# Patient Record
Sex: Male | Born: 1950 | Race: White | Hispanic: No | Marital: Married | Smoking: Former smoker
Health system: Southern US, Community
[De-identification: ages and names within clinical notes are randomized; demographics above are authoritative.]

## PROBLEM LIST (undated history)

## (undated) DIAGNOSIS — G20A1 Parkinson's disease without dyskinesia, without mention of fluctuations: Secondary | ICD-10-CM

## (undated) DIAGNOSIS — C801 Malignant (primary) neoplasm, unspecified: Secondary | ICD-10-CM

## (undated) DIAGNOSIS — C9110 Chronic lymphocytic leukemia of B-cell type not having achieved remission: Secondary | ICD-10-CM

## (undated) DIAGNOSIS — M1990 Unspecified osteoarthritis, unspecified site: Secondary | ICD-10-CM

## (undated) DIAGNOSIS — N400 Enlarged prostate without lower urinary tract symptoms: Secondary | ICD-10-CM

## (undated) HISTORY — DX: Benign prostatic hyperplasia without lower urinary tract symptoms: N40.0

## (undated) HISTORY — PX: WISDOM TOOTH EXTRACTION: SHX21

## (undated) HISTORY — DX: Parkinson's disease without dyskinesia, without mention of fluctuations: G20.A1

## (undated) HISTORY — PX: FRACTURE SURGERY: SHX138

## (undated) HISTORY — PX: TONSILLECTOMY: SUR1361

---

## 2020-12-16 ENCOUNTER — Encounter: Payer: Self-pay | Admitting: Podiatry

## 2020-12-16 ENCOUNTER — Other Ambulatory Visit: Payer: Self-pay

## 2020-12-16 ENCOUNTER — Ambulatory Visit (INDEPENDENT_AMBULATORY_CARE_PROVIDER_SITE_OTHER): Payer: Medicare Other | Admitting: Podiatry

## 2020-12-16 DIAGNOSIS — B351 Tinea unguium: Secondary | ICD-10-CM | POA: Diagnosis not present

## 2020-12-16 DIAGNOSIS — Z79899 Other long term (current) drug therapy: Secondary | ICD-10-CM | POA: Diagnosis not present

## 2020-12-20 ENCOUNTER — Encounter: Payer: Self-pay | Admitting: Podiatry

## 2020-12-20 NOTE — Progress Notes (Signed)
  Subjective:  Patient ID: Jaqualin Serpa, male    DOB: 26-Apr-1951,  MRN: [id number]  Chief Complaint  Patient presents with   Nail Problem    70 y.o. male presents with the above complaint. History confirmed with patient.  Nail fungus has returned.  He tried Pharmacologist many years ago.  Had some success with that but it had returned.  It was difficult to use it consistently daily after some time.  Objective:  Physical Exam: warm, good capillary refill, no trophic changes or ulcerative lesions, normal DP and PT pulses, and normal sensory exam. Left Foot: dystrophic yellowed discolored nail plates with subungual debris Right Foot: dystrophic yellowed discolored nail plates with subungual debris   Assessment:   1. Onychomycosis   2. Long-term use of high-risk medication      Plan:  Patient was evaluated and treated and all questions answered.  Discussed oral and topical treatment for onychomycosis.  I recommended oral treatment with terbinafine.  Liver function testing was ordered.  We will prescribe this for him pending results of liver function testing.  He is having this done at his local hospital in [place] and will have the results faxed to me.  Return in about 4 months (around 04/18/2021).

## 2021-01-10 NOTE — Progress Notes (Signed)
Sent message, via epic in basket, requesting orders in epic from surgeon.  

## 2021-01-11 ENCOUNTER — Other Ambulatory Visit: Payer: Self-pay | Admitting: Podiatry

## 2021-01-11 NOTE — Telephone Encounter (Signed)
Patient was caling to follow up on blood work that was done a couple of weeks ago at [facility] in [place]

## 2021-01-12 NOTE — Addendum Note (Signed)
Addended bySherryle Lis, Irineo Gaulin R on: 01/12/2021 08:09 AM   Modules accepted: Orders

## 2021-01-13 ENCOUNTER — Ambulatory Visit (INDEPENDENT_AMBULATORY_CARE_PROVIDER_SITE_OTHER): Payer: Medicare Other | Admitting: Orthopedic Surgery

## 2021-01-13 ENCOUNTER — Other Ambulatory Visit: Payer: Self-pay

## 2021-01-13 ENCOUNTER — Encounter: Payer: Self-pay | Admitting: Orthopedic Surgery

## 2021-01-13 VITALS — BP 134/82 | HR 83 | Temp 98.2°F | Ht 67.0 in | Wt 141.4 lb

## 2021-01-13 DIAGNOSIS — G8929 Other chronic pain: Secondary | ICD-10-CM

## 2021-01-13 DIAGNOSIS — R251 Tremor, unspecified: Secondary | ICD-10-CM

## 2021-01-13 DIAGNOSIS — R351 Nocturia: Secondary | ICD-10-CM | POA: Diagnosis not present

## 2021-01-13 DIAGNOSIS — B351 Tinea unguium: Secondary | ICD-10-CM

## 2021-01-13 DIAGNOSIS — M25561 Pain in right knee: Secondary | ICD-10-CM | POA: Diagnosis not present

## 2021-01-13 NOTE — Progress Notes (Signed)
Careteam: Patient Care Team: Yvonna Alanis, NP as PCP - General (Adult Health Nurse Practitioner) [name], DPM as Consulting Physician (Podiatry)  Seen by: [name], AGNP-C  PLACE OF SERVICE:  [place] Directive information    No Known Allergies  Chief Complaint  Patient presents with   New Patient (Initial Visit)    Patient presents today for a new patient appointment.     HPI: Patient is a 70 y.o. male seen today to establish at [facility].   Wife present during encounter.   He does not have primary doctor. Used urgent care in [place], [place]. Married. One child. No pets. Owns [employer]. Lives in 3 story house.   Right knee pain- Began a couple years. Followed by Dr. Wynelle Link.  Plans to have right total knee arthroplasty 01/31/2021. He has not been given surgical clearance forms to be filled out. He has a pre-surgical appointment at [facility] a few days prior to surgery. At this time he is not taking anything for the pain. He is lifting weights to prepare for surgery. Attending [employer] joint education class soon.   Nocturia- reports urinating a few times a night. Does not limits fluids prior to bedtime. Denies bladder pressure or fullness.   Toenail fungus- followed by [facility]. Diagnosed with onychomycosis. LFT unremarkable. He mentioned starting medication, but has not been prescribed anything at this time.   Left hand tremor-  wife reports resting hand tremor. She also believes he may have some left hand weakness. Patient denies any issues with left hand/arm.   Past surgery- none  Hospitalizations/injuries- none  Family history- adopted does not know medical history.  Procedures: Colonoscopy- interested in cologuard, denies changes to bowel habits or blood in stool  Vaccinations: Covid- UTD Pneumonia- unsure if he has gotten these vaccines Tetanus- unsure Shingles- at [facility] in 2021,  Shingrix Flu vaccine - receives yearly  Diet: eats 3 meals a day, follows mediterranean diet, does not eat red meat often, no soda.  Exercise: no structured routine at this time due to knee pain, doing free weights to prepare for upcoming surgery. He has a gym at home, also stays active doing yard work.  Alcohol: about 3 drinks a week or less Smoking: smoked about 1 padk a day for about 10 years, quit in 1992.  Illicit drug use: none  Denitst: Has not seen a dentist in years  Eye exam- uses reading glasses, reports popped capillary in eye about a year ago, denies changes to vision.   No advanced directives- they plan to have done in near future.                Review of Systems:  Review of Systems  Constitutional:  Negative for chills, fever, malaise/fatigue and weight loss.  HENT:  Positive for hearing loss and tinnitus.   Eyes:  Negative for blurred vision and double vision.       Eyeglasses  Respiratory:  Negative for cough, shortness of breath and wheezing.   Cardiovascular:  Negative for chest pain and leg swelling.  Gastrointestinal:  Negative for abdominal pain, blood in stool, constipation, diarrhea, heartburn, nausea and vomiting.  Genitourinary:  Positive for frequency and urgency. Negative for dysuria and hematuria.  Musculoskeletal:  Positive for joint pain. Negative for falls.       Right knee pain  Skin: Negative.   Neurological:  Positive for tingling and tremors. Negative for dizziness  and weakness.  Psychiatric/Behavioral:  Negative for depression and memory loss. The patient is not nervous/anxious and does not have insomnia.    History reviewed. No pertinent past medical history. History reviewed. No pertinent surgical history. Social History:   reports that he quit smoking about 30 years ago. His smoking use included cigarettes. He has a 10.00 pack-year smoking history. He has never used smokeless tobacco. He reports current alcohol use of about 1.0  - 2.0 standard drink of alcohol per week. He reports that he does not use drugs.  Family History  Adopted: Yes    Medications: Patient's Medications  New Prescriptions   No medications on file  Previous Medications   ASCORBIC ACID (VITAMIN C) 1000 MG TABLET    Take 1,000 mg by mouth daily.   B COMPLEX-BIOTIN-FA (B-COMPLEX PO)    Take by mouth daily.   CHOLECALCIFEROL (VITAMIN D3 PO)    Take 2,000 Units by mouth.   MULTIPLE VITAMIN (ONE DAILY) TABLET    Take 1 tablet by mouth daily.   OMEGA-3 FATTY ACIDS (FISH OIL) 1200 MG CAPS    Take 1,200 mg by mouth daily.   VITAMIN E 1000 UNIT CAPSULE    Take 1,000 Units by mouth daily.  Modified Medications   No medications on file  Discontinued Medications   No medications on file    Physical Exam:  Vitals:   01/13/21 1111  BP: 134/82  Pulse: 83  Temp: 98.2 F (36.8 C)  SpO2: 98%  Weight: 141 lb 6.4 oz (64.1 kg)  Height: '5\' 7"'$  (1.702 m)   Body mass index is 22.15 kg/m. Wt Readings from Last 3 Encounters:  01/13/21 141 lb 6.4 oz (64.1 kg)    Physical Exam Vitals reviewed.  Constitutional:      General: He is not in acute distress. HENT:     Head: Normocephalic.  Eyes:     General:        Right eye: No discharge.        Left eye: No discharge.  Cardiovascular:     Rate and Rhythm: Normal rate and regular rhythm.  Pulmonary:     Effort: Pulmonary effort is normal. No respiratory distress.     Breath sounds: Normal breath sounds. No wheezing.  Abdominal:     General: Bowel sounds are normal. There is no distension.     Palpations: Abdomen is soft.     Tenderness: There is no abdominal tenderness.  Musculoskeletal:     Cervical back: Normal range of motion.     Right lower leg: No edema.     Left lower leg: No edema.  Lymphadenopathy:     Cervical: No cervical adenopathy.  Skin:    General: Skin is warm and dry.     Capillary Refill: Capillary refill takes less than 2 seconds.  Neurological:     General: No focal  deficit present.     Mental Status: He is alert and oriented to person, place, and time.     Sensory: Sensation is intact.     Motor: Motor function is intact.     Coordination: Coordination is intact.     Gait: Gait is intact.     Comments: Left hand resting tremor. Bilateral hand grips 5/5, full sensation.   Psychiatric:        Mood and Affect: Mood normal.        Behavior: Behavior normal.    Labs reviewed: Basic Metabolic Panel: No results for input(s): NA,  K, CL, CO2, GLUCOSE, BUN, CREATININE, CALCIUM, MG, PHOS, TSH in the last 8760 hours. Liver Function Tests: No results for input(s): AST, ALT, ALKPHOS, BILITOT, PROT, ALBUMIN in the last 8760 hours. No results for input(s): LIPASE, AMYLASE in the last 8760 hours. No results for input(s): AMMONIA in the last 8760 hours. CBC: No results for input(s): WBC, NEUTROABS, HGB, HCT, MCV, PLT in the last 8760 hours. Lipid Panel: No results for input(s): CHOL, HDL, LDLCALC, TRIG, CHOLHDL, LDLDIRECT in the last 8760 hours. TSH: No results for input(s): TSH in the last 8760 hours. A1C: No results found for: HGBA1C   Assessment/Plan 1. Chronic pain of right knee - followed by Dr. Wynelle Link - right total knee arthroplasty 01/31/2021 - does not wish to take any medications for pain - discussed using ice to help with pain  2. Nocturia - reports using bathroom often at night - recommend limiting fluids 2 hours prior to bedtime - recommend avoiding bladder irritants   3. Tremor of left hand - left hand resting tremor observed - left hand grip 5/5, LUE FROM, full sensation - may consider neuro referral if symptoms worsen  4. Onychomycosis - followed by [facility] - liver function tests unremarkable- new medication has not been ordered  Recommend MMSE, EKG, discuss colonoscopy next visit  Total time: 42 minutes. Greater than 50% of total time spent doing patient education on health maintenance and symptom management.    Next appt: 03/17/2021  [name], [facility] [phone]

## 2021-01-14 DIAGNOSIS — R251 Tremor, unspecified: Secondary | ICD-10-CM

## 2021-01-14 DIAGNOSIS — R351 Nocturia: Secondary | ICD-10-CM

## 2021-01-14 DIAGNOSIS — B351 Tinea unguium: Secondary | ICD-10-CM | POA: Insufficient documentation

## 2021-01-14 DIAGNOSIS — G8929 Other chronic pain: Secondary | ICD-10-CM | POA: Insufficient documentation

## 2021-01-14 HISTORY — DX: Nocturia: R35.1

## 2021-01-14 HISTORY — DX: Tremor, unspecified: R25.1

## 2021-01-14 HISTORY — DX: Tinea unguium: B35.1

## 2021-01-20 ENCOUNTER — Ambulatory Visit (INDEPENDENT_AMBULATORY_CARE_PROVIDER_SITE_OTHER): Payer: Medicare Other | Admitting: Podiatry

## 2021-01-20 ENCOUNTER — Encounter: Payer: Self-pay | Admitting: Podiatry

## 2021-01-20 ENCOUNTER — Other Ambulatory Visit: Payer: Self-pay

## 2021-01-20 DIAGNOSIS — B351 Tinea unguium: Secondary | ICD-10-CM | POA: Diagnosis not present

## 2021-01-20 DIAGNOSIS — M79675 Pain in left toe(s): Secondary | ICD-10-CM | POA: Diagnosis not present

## 2021-01-20 DIAGNOSIS — M79674 Pain in right toe(s): Secondary | ICD-10-CM | POA: Diagnosis not present

## 2021-01-20 MED ORDER — TERBINAFINE HCL 250 MG PO TABS: 250.0000 mg | ORAL_TABLET | Freq: Every day | ORAL | 0 refills | Status: DC

## 2021-01-20 NOTE — Patient Instructions (Addendum)
DUE TO COVID-19 ONLY ONE VISITOR IS ALLOWED TO COME WITH YOU AND STAY IN THE WAITING ROOM ONLY DURING PRE OP AND PROCEDURE DAY OF SURGERY. THE 2 VISITORS  MAY VISIT WITH YOU AFTER SURGERY IN YOUR PRIVATE ROOM DURING VISITING HOURS ONLY!  YOU NEED TO HAVE A COVID 19 TEST ON__8/18/22____ THIS TEST MUST BE DONE BEFORE SURGERY,                 Faiz Shim Epperly     Your procedure is scheduled on: 01/31/21   Report to [place]  Entrance   Report to short stay at 5:50 AM     Call this number if you have problems the morning of surgery [place], NO [place].  No food after midnight.    You may have clear liquid until 5:00 AM.    At 4:30 AM drink pre surgery drink  . Nothing by mouth after 5:00 AM.     Take these medicines the morning of surgery with A SIP OF WATER: none                                 You may not have any metal on your body including              piercings  Do not wear jewelry, lotions, powders or deodorant                          Men may shave face and neck.   Do not bring valuables to the hospital. [facility].  Contacts, dentures or bridgework may not be worn into surgery.                    Please read over the following fact sheets you were given: _____________________________________________________________________             [facility] - Preparing for Surgery Before surgery, you can play an important role.  Because skin is not sterile, your skin needs to be as free of germs as possible.  You can reduce the number of germs on your skin by washing with CHG (chlorahexidine gluconate) soap before surgery.  CHG is an antiseptic cleaner which kills germs and bonds with the skin to continue killing germs even after washing. Please DO NOT use if you have an allergy to CHG or antibacterial soaps.  If your skin  becomes reddened/irritated stop using the CHG and inform your nurse when you arrive at Short Stay.   You may shave your face/neck.  Please follow these instructions carefully:  1.  Shower with CHG Soap the night before surgery and the  morning of Surgery.  2.  If you choose to wash your hair, wash your hair first as usual with your  normal  shampoo.  3.  After you shampoo, rinse your hair and body thoroughly to remove the  shampoo.                                        4.  Use CHG as you would any other  liquid soap.  You can apply chg directly  to the skin and wash                       Gently with a scrungie or clean washcloth.  5.  Apply the CHG Soap to your body ONLY FROM THE NECK DOWN.   Do not use on face/ open                           Wound or open sores. Avoid contact with eyes, ears mouth and genitals (private parts).                       Wash face,  Genitals (private parts) with your normal soap.             6.  Wash thoroughly, paying special attention to the area where your surgery  will be performed.  7.  Thoroughly rinse your body with warm water from the neck down.  8.  DO NOT shower/wash with your normal soap after using and rinsing off  the CHG Soap.             9.  Pat yourself dry with a clean towel.            10.  Wear clean pajamas.            11.  Place clean sheets on your bed the night of your first shower and do not  sleep with pets. Day of Surgery : Do not apply any lotions/deodorants the morning of surgery.  Please wear clean clothes to the hospital/surgery center.  FAILURE TO FOLLOW THESE INSTRUCTIONS MAY RESULT IN THE CANCELLATION OF YOUR SURGERY PATIENT SIGNATURE_________________________________  NURSE SIGNATURE__________________________________  ________________________________________________________________________   Adam Phenix  An incentive spirometer is a tool that can help keep your lungs clear and active. This tool measures how well you  are filling your lungs with each breath. Taking long deep breaths may help reverse or decrease the chance of developing breathing (pulmonary) problems (especially infection) following: A long period of time when you are unable to move or be active. BEFORE THE PROCEDURE  If the spirometer includes an indicator to show your best effort, your nurse or respiratory therapist will set it to a desired goal. If possible, sit up straight or lean slightly forward. Try not to slouch. Hold the incentive spirometer in an upright position. INSTRUCTIONS FOR USE  Sit on the edge of your bed if possible, or sit up as far as you can in bed or on a chair. Hold the incentive spirometer in an upright position. Breathe out normally. Place the mouthpiece in your mouth and seal your lips tightly around it. Breathe in slowly and as deeply as possible, raising the piston or the ball toward the top of the column. Hold your breath for 3-5 seconds or for as long as possible. Allow the piston or ball to fall to the bottom of the column. Remove the mouthpiece from your mouth and breathe out normally. Rest for a few seconds and repeat Steps 1 through 7 at least 10 times every 1-2 hours when you are awake. Take your time and take a few normal breaths between deep breaths. The spirometer may include an indicator to show your best effort. Use the indicator as a goal to work toward during each repetition. After each set of 10 deep breaths, practice coughing to  be sure your lungs are clear. If you have an incision (the cut made at the time of surgery), support your incision when coughing by placing a pillow or rolled up towels firmly against it. Once you are able to get out of bed, walk around indoors and cough well. You may stop using the incentive spirometer when instructed by your caregiver.  RISKS AND COMPLICATIONS Take your time so you do not get dizzy or light-headed. If you are in pain, you may need to take or ask for pain  medication before doing incentive spirometry. It is harder to take a deep breath if you are having pain. AFTER USE Rest and breathe slowly and easily. It can be helpful to keep track of a log of your progress. Your caregiver can provide you with a simple table to help with this. If you are using the spirometer at home, follow these instructions: [place] IF:  You are having difficultly using the spirometer. You have trouble using the spirometer as often as instructed. Your pain medication is not giving enough relief while using the spirometer. You develop fever of 100.5 F (38.1 C) or higher. SEEK IMMEDIATE MEDICAL CARE IF:  You cough up bloody sputum that had not been present before. You develop fever of 102 F (38.9 C) or greater. You develop worsening pain at or near the incision site. MAKE SURE YOU:  Understand these instructions. Will watch your condition. Will get help right away if you are not doing well or get worse. Document Released: 10/09/2006 Document Revised: 08/21/2011 Document Reviewed: 12/10/2006 [facility] Patient Information 2014 [place], [place].   ________________________________________________________________________

## 2021-01-21 ENCOUNTER — Encounter (HOSPITAL_COMMUNITY)
Admission: RE | Admit: 2021-01-21 | Discharge: 2021-01-21 | Disposition: A | Discharge status: Home / Self Care | Payer: Medicare Other | Source: Ambulatory Visit | Attending: Orthopedic Surgery | Admitting: Orthopedic Surgery

## 2021-01-21 ENCOUNTER — Encounter (HOSPITAL_COMMUNITY): Payer: Self-pay

## 2021-01-21 ENCOUNTER — Other Ambulatory Visit: Payer: Self-pay

## 2021-01-21 DIAGNOSIS — Z79899 Other long term (current) drug therapy: Secondary | ICD-10-CM | POA: Insufficient documentation

## 2021-01-21 DIAGNOSIS — Z01812 Encounter for preprocedural laboratory examination: Secondary | ICD-10-CM | POA: Insufficient documentation

## 2021-01-21 DIAGNOSIS — M1711 Unilateral primary osteoarthritis, right knee: Secondary | ICD-10-CM | POA: Diagnosis not present

## 2021-01-21 LAB — COMPREHENSIVE METABOLIC PANEL
ALT: 25 U/L (ref 0–44)
AST: 51 U/L — ABNORMAL HIGH (ref 15–41)
Albumin: 4.2 g/dL (ref 3.5–5.0)
Alkaline Phosphatase: 59 U/L (ref 38–126)
Anion gap: 9 (ref 5–15)
BUN: 24 mg/dL — ABNORMAL HIGH (ref 8–23)
CO2: 27 mmol/L (ref 22–32)
Calcium: 9.4 mg/dL (ref 8.9–10.3)
Chloride: 102 mmol/L (ref 98–111)
Creatinine, Ser: 1.09 mg/dL (ref 0.61–1.24)
GFR, Estimated: >60 mL/min (ref >60)
Glucose, Bld: 58 mg/dL — ABNORMAL LOW (ref 70–99)
Potassium: 7.2 mmol/L (ref 3.5–5.1)
Sodium: 138 mmol/L (ref 135–145)
Total Bilirubin: 1.6 mg/dL — ABNORMAL HIGH (ref 0.3–1.2)
Total Protein: 6.8 g/dL (ref 6.5–8.1)

## 2021-01-21 LAB — PROTIME-INR
INR: 1.1 (ref 0.8–1.2)
Prothrombin Time: 14.1 seconds (ref 11.4–15.2)

## 2021-01-21 LAB — SURGICAL PCR SCREEN
MRSA, PCR: NEGATIVE
Staphylococcus aureus: NEGATIVE

## 2021-01-21 LAB — CBC
HCT: 43.9 % (ref 39.0–52.0)
Hemoglobin: 14.2 g/dL (ref 13.0–17.0)
MCH: 30.6 pg (ref 26.0–34.0)
MCHC: 32.3 g/dL (ref 30.0–36.0)
MCV: 94.6 fL (ref 80.0–100.0)
Platelets: 131 10*3/uL — ABNORMAL LOW (ref 150–400)
RBC: 4.64 MIL/uL (ref 4.22–5.81)
RDW: 15.4 % (ref 11.5–15.5)
WBC: 39.8 10*3/uL — ABNORMAL HIGH (ref 4.0–10.5)
nRBC: 0 % (ref 0.0–0.2)

## 2021-01-21 NOTE — Progress Notes (Signed)
COVID Vaccine Completed:yes Date COVID Vaccine completed:07/24/19, booster 03/10/20, 09/20/20 COVID vaccine manufacturer: [employer] [place]      PCP - [name] NP  LOV 01/13/21. [employer]. Cardiologist - none  Chest x-ray - no EKG - no Stress Test - no ECHO - no Cardiac Cath - NA Pacemaker/ICD device last checked:NA  Sleep Study - no CPAP -   Fasting Blood Sugar - NA Checks Blood Sugar _____ times a day  Blood Thinner Instructions:NA Aspirin Instructions: Last Dose:  Anesthesia review: no  Patient denies shortness of breath, fever, cough and chest pain at PAT appointment Pt has no SOB and had been walking regularly before knee pain. Wife reports lt side weakness but Pt denies it . Patient verbalized understanding of instructions that were given to them at the PAT appointment. Patient was also instructed that they will need to review over the PAT instructions again at home before surgery. yes

## 2021-01-24 NOTE — Progress Notes (Signed)
  Subjective:  Patient ID: Frederick Pham, male    DOB: 1950-12-24,  MRN: [id number]  Chief Complaint  Patient presents with   Nail Problem    70 y.o. male presents with the above complaint. History confirmed with patient.  He completed his lab work  Objective:  Physical Exam: warm, good capillary refill, no trophic changes or ulcerative lesions, normal DP and PT pulses, and normal sensory exam. Left Foot: dystrophic yellowed discolored nail plates with subungual debris Right Foot: dystrophic yellowed discolored nail plates with subungual debris  LFTs normal Assessment:   1. Pain due to onychomycosis of toenails of both feet      Plan:  Patient was evaluated and treated and all questions answered.  We again discussed oral and topical treatment for onychomycosis.  I recommended oral treatment with terbinafine.  We reviewed his liver function testing .  Prescription for Lamisil sent I reviewed its use.  He will follow-up with me in a few months after he starts his Lamisil he will likely delay this for a month or so after his orthopedic surgery upcoming  Discussed the etiology and treatment options for the condition in detail with the patient. Educated patient on the topical and oral treatment options for mycotic nails. Recommended debridement of the nails today. Sharp and mechanical debridement performed of all painful and mycotic nails today. Nails debrided in length and thickness using a nail nipper to level of comfort. Discussed treatment options including appropriate shoe gear. Follow up as needed for painful nails.    Return for will call after lamisil .

## 2021-01-25 ENCOUNTER — Other Ambulatory Visit: Payer: Self-pay | Admitting: Orthopedic Surgery

## 2021-01-25 ENCOUNTER — Encounter (HOSPITAL_COMMUNITY): Payer: Self-pay | Admitting: Emergency Medicine

## 2021-01-25 DIAGNOSIS — D72829 Elevated white blood cell count, unspecified: Secondary | ICD-10-CM

## 2021-01-25 NOTE — Progress Notes (Signed)
Anesthesia Chart Review:   Case: [id number] Date/Time: 01/31/21 0805   Procedure: TOTAL KNEE ARTHROPLASTY (Right: Knee)   Anesthesia type: Choice   Pre-op diagnosis: right knee osteoarthritis   Location: [facility] ROOM 10 / [facility]   Surgeons: Gaynelle Arabian, MD       DISCUSSION: Pt is 70 years old with no listed medical history  Critical K of 7.2 was apparently called to Dr. Anne Fu office when it resulted evening of 8/12.   WBC is 39.8  I reviewed case with Dr. Therisa Doyne. Pt will need elevated WBC and K evaluated before surgery. I notified Claiborne Billings in Dr. Anne Fu office.    VS: BP 136/66   Pulse 82   Temp 36.8 C (Oral)   Resp 19   Ht '5\' 5"'$  (1.651 m)   Wt 63.5 kg   SpO2 100%   BMI 23.30 kg/m   PROVIDERS: - PCP is [name], Amy E, NP   LABS:  - K 7.2. Lab results do not indicate hemolysis was present - WBC 39.8 - glucose 58  (all labs ordered are listed, but only abnormal results are displayed)  Labs Reviewed  CBC - Abnormal; Notable for the following components:      Result Value   WBC 39.8 (*)    Platelets 131 (*)    All other components within normal limits  COMPREHENSIVE METABOLIC PANEL - Abnormal; Notable for the following components:   Potassium 7.2 (*)    Glucose, Bld 58 (*)    BUN 24 (*)    AST 51 (*)    Total Bilirubin 1.6 (*)    All other components within normal limits  SURGICAL PCR SCREEN  PROTIME-INR     IMAGES: N/A   EKG: N/A   CV: N/A  History reviewed. No pertinent past medical history.  Past Surgical History:  Procedure Laterality Date   FRACTURE SURGERY     age 31   TONSILLECTOMY     age 27   WISDOM TOOTH EXTRACTION      MEDICATIONS:  Ascorbic Acid (VITAMIN C) 1000 MG tablet   B Complex-Biotin-FA (B-COMPLEX PO)   Cholecalciferol (VITAMIN D3 PO)   Omega-3 Fatty Acids (FISH OIL) 1200 MG CAPS   terbinafine (LAMISIL) 250 MG tablet   vitamin E 1000 UNIT capsule   No current facility-administered medications for this  encounter.    Willeen Cass, PhD, FNP-BC [facility] Stay Surgical [facility]/Anesthesiology Phone: [phone] 01/25/2021 3:35 PM

## 2021-01-26 ENCOUNTER — Telehealth: Payer: Self-pay

## 2021-01-26 NOTE — Telephone Encounter (Signed)
Wife [name]) called about referral that was placed yesterday, 01/26/2021 to hematology. Advised wife that the office which the referral was places to will contact them to schedule appointment. She wanted to contact the office herself and was advised that the referral maybe placed to the [place] location.

## 2021-01-28 ENCOUNTER — Telehealth: Payer: Self-pay | Admitting: Physician Assistant

## 2021-01-28 NOTE — Telephone Encounter (Signed)
Received a new hem referral from [facility] for leukocytosis. Mr. Schaible has been cld and scheduled to see Murray Hodgkins on 8/30 at 11am. Pt aware to arrive 15 minutes early.

## 2021-01-31 ENCOUNTER — Encounter (HOSPITAL_COMMUNITY): Admission: RE | Payer: Self-pay | Source: Home / Self Care

## 2021-01-31 ENCOUNTER — Ambulatory Visit (HOSPITAL_COMMUNITY): Admission: RE | Admit: 2021-01-31 | Payer: Medicare Other | Source: Home / Self Care | Admitting: Orthopedic Surgery

## 2021-01-31 SURGERY — ARTHROPLASTY, KNEE, TOTAL
Anesthesia: Choice | Site: Knee | Laterality: Right

## 2021-02-07 NOTE — Progress Notes (Signed)
[place] Telephone:(336) [phone]   Fax:(336) [place] NOTE  Patient Care Team: Yvonna Alanis, NP as PCP - General (Adult Health Nurse Practitioner) [name], DPM as Consulting Physician (Podiatry)  Hematological/Oncological History 1) 01/21/2021: WBC 39.8 (H), Hgb 14.2, Plt 131 (L)  2) 02/08/2021: Establish care with Dede Query PA-C. -WBC: 46.7, Hgb 14.1, Plt 113K  CHIEF COMPLAINTS/PURPOSE OF CONSULTATION:  "Lymphocytosis"  HISTORY OF PRESENTING ILLNESS:  Frederick Pham 70 y.o. male without significant medical history presents to the clinic for evaluation for lymphocytosis. Patient is accompanied by his wife for this visit.   On exam today, Frederick Pham feeling well without any fatigue. He is able to complete all his daily activities on his own. He has a good appetite and added that he started to eat a Mediterranean diet in the last two years. During this period, he has lost 20-25 lbs. He denies nausea, vomiting or abdominal pain. His bowel movements are regular without any diarrhea or constipation. He denies easy bruising or signs of bleeding. He denies fevers, chills, night sweats, shortness of breath, chest pain or cough. He has no other complaints. Rest of the 10 point ROS is below.   MEDICAL HISTORY:  No past medical history on file.  SURGICAL HISTORY: Past Surgical History:  Procedure Laterality Date   FRACTURE SURGERY     age 51   TONSILLECTOMY     age 53   WISDOM TOOTH EXTRACTION      SOCIAL HISTORY: Social History   Socioeconomic History   Marital status: Married    Spouse name: Not on file   Number of children: Not on file   Years of education: Not on file   Highest education level: Not on file  Occupational History   Not on file  Tobacco Use   Smoking status: Former    Packs/day: 1.00    Years: 10.00    Pack years: 10.00    Types: Cigarettes    Quit date: 10/08/1990    Years since quitting: 30.3    Smokeless tobacco: Never  Vaping Use   Vaping Use: Never used  Substance and Sexual Activity   Alcohol use: Yes    Alcohol/week: 1.0 - 2.0 standard drink    Types: 1 - 2 Standard drinks or equivalent per week   Drug use: Never   Sexual activity: Not on file  Other Topics Concern   Not on file  Social History Narrative   Diet:      Caffeine: Yes      Married, if yes what year: Yes, 1977      Do you live in a house, apartment, assisted living, condo, trailer, ect: House      Is it one or more stories: 3      How many persons live in your home? 2      Pets: No      Highest level or education completed: 15      Current/Past profession: Chief Financial Officer, Teacher, English as a foreign language      Exercise: Yes                 Type and how often: weights- normally 5 days/week         Living Will: No   DNR: No   POA/HPOA: No      Functional Status:   Do you have difficulty bathing or dressing yourself? No   Do you have difficulty preparing food or eating? No  Do you have difficulty managing your medications? No   Do you have difficulty managing your finances? No   Do you have difficulty affording your medications? No   Social Determinants of Radio broadcast assistant Strain: Not on file  Food Insecurity: Not on file  Transportation Needs: Not on file  Physical Activity: Not on file  Stress: Not on file  Social Connections: Not on file  Intimate Partner Violence: Not on file    FAMILY HISTORY: Family History  Adopted: Yes    ALLERGIES:  has No Known Allergies.  MEDICATIONS:  Current Outpatient Medications  Medication Sig Dispense Refill   Ascorbic Acid (VITAMIN C) 1000 MG tablet Take 1,000 mg by mouth daily.     B Complex-Biotin-FA (B-COMPLEX PO) Take by mouth daily.     Cholecalciferol (VITAMIN D3 PO) Take 2,000 Units by mouth.     Omega-3 Fatty Acids (FISH OIL) 1200 MG CAPS Take 1,200 mg by mouth daily.     vitamin E 1000 UNIT capsule Take 1,000 Units by mouth daily.     terbinafine  (LAMISIL) 250 MG tablet Take 1 tablet (250 mg total) by mouth daily. (Patient not taking: Reported on 02/08/2021) 90 tablet 0   No current facility-administered medications for this visit.    REVIEW OF SYSTEMS:   Constitutional: ( - ) fevers, ( - )  chills , ( - ) night sweats Eyes: ( - ) blurriness of vision, ( - ) double vision, ( - ) watery eyes Ears, nose, mouth, throat, and face: ( - ) mucositis, ( - ) sore throat Respiratory: ( - ) cough, ( - ) dyspnea, ( - ) wheezes Cardiovascular: ( - ) palpitation, ( - ) chest discomfort, ( - ) lower extremity swelling Gastrointestinal:  ( - ) nausea, ( - ) heartburn, ( - ) change in bowel habits Skin: ( - ) abnormal skin rashes Lymphatics: ( - ) new lymphadenopathy, ( - ) easy bruising Neurological: ( - ) numbness, ( - ) tingling, ( - ) new weaknesses Behavioral/Psych: ( - ) mood change, ( - ) new changes  All other systems were reviewed with the patient and are negative.  PHYSICAL EXAMINATION: ECOG PERFORMANCE STATUS: 0 - Asymptomatic  Vitals:   02/08/21 1045  BP: 132/65  Pulse: 73  Resp: 18  Temp: 98.1 F (36.7 C)  SpO2: 100%   Filed Weights   02/08/21 1045  Weight: 139 lb 1.6 oz (63.1 kg)    GENERAL: well appearing male in NAD  SKIN: skin color, texture, turgor are normal, no rashes or significant lesions EYES: conjunctiva are pink and non-injected, sclera clear OROPHARYNX: no exudate, no erythema; lips, buccal mucosa, and tongue normal  NECK: supple, non-tender LYMPH:  no palpable lymphadenopathy in the cervical, axillary or supraclavicular lymph nodes.  LUNGS: clear to auscultation and percussion with normal breathing effort HEART: regular rate & rhythm and no murmurs and no lower extremity edema ABDOMEN: soft, non-tender, non-distended, normal bowel sounds Musculoskeletal: no cyanosis of digits and no clubbing  PSYCH: alert & oriented x 3, fluent speech NEURO: no focal motor/sensory deficits  LABORATORY DATA:  I have  reviewed the data as listed CBC Latest Ref Rng & Units 02/08/2021 01/21/2021  WBC 4.0 - 10.5 K/uL 46.7(H) 39.8(H)  Hemoglobin 13.0 - 17.0 g/dL 14.1 14.2  Hematocrit 39.0 - 52.0 % 41.7 43.9  Platelets 150 - 400 K/uL 113(L) 131(L)    CMP Latest Ref Rng & Units 02/08/2021 01/21/2021  Glucose  70 - 99 mg/dL 108(H) 58(L)  BUN 8 - 23 mg/dL 25(H) 24(H)  Creatinine 0.61 - 1.24 mg/dL 1.25(H) 1.09  Sodium 135 - 145 mmol/L 143 138  Potassium 3.5 - 5.1 mmol/L 4.9 7.2(HH)  Chloride 98 - 111 mmol/L 107 102  CO2 22 - 32 mmol/L 29 27  Calcium 8.9 - 10.3 mg/dL 9.8 9.4  Total Protein 6.5 - 8.1 g/dL 6.7 6.8  Total Bilirubin 0.3 - 1.2 mg/dL 1.4(H) 1.6(H)  Alkaline Phos 38 - 126 U/L 69 59  AST 15 - 41 U/L 17 51(H)  ALT 0 - 44 U/L 18 25     RADIOGRAPHIC STUDIES: No images were reviewed during this visit.   ASSESSMENT & PLAN Frederick Pham is a 71 y.o. who presents to the clinic for evaluation for lymphocytosis. I reviewed differentials including infectious processes, inflammatory processes and bone marrow disorders. Patient does not exhibit any signs or symptoms of an active infection. We will proceed with labs today to check CMP, LDH, Uric Acid, Phosphorus and Flow Cytometry. Additionally, CBC from earlier today show worsening thrombocytopenia along with lymphocytosis. Hence, we will obtain CT imaging to evaluate for lymphoproliferative process and splenomegaly.   #Lymphocytosis with thrombocytopenia: --CBC with diff from today showed WBC 46.7, Plt 113K, Lymph Abs 43.4. --Need additional labs today to check CMP, LDH, uric Acid, phosphorus and flow cytometry --No B-symptoms outside of 20-25 lb weight loss in a two year period.  --CT chest, abdomen, pelvis with contrast to evaluate for lymphadenopathy and/or splenomegaly. --Tentative follow up will be scheduled for 2 weeks  #Hyperkalemia: --Seen on CMP from 01/24/2021. Potassium was 7.2.  --Likely lab error but will repeat levels today to confirm.     Orders Placed This Encounter  Procedures   CT CHEST ABDOMEN PELVIS W CONTRAST    Standing Status:   Future    Standing Expiration Date:   02/08/2022    Order Specific Question:   If indicated for the ordered procedure, I authorize the administration of contrast media per Radiology protocol    Answer:   Yes    Order Specific Question:   Preferred imaging location?    Answer:   [facility]    Order Specific Question:   Is Oral Contrast requested for this exam?    Answer:   Yes, Per Radiology protocol    Order Specific Question:   Reason for Exam (SYMPTOM  OR DIAGNOSIS REQUIRED)    Answer:   lymphocytosis   Flow Cytometry, Peripheral Blood (Oncology)    Standing Status:   Future    Number of Occurrences:   1    Standing Expiration Date:   02/08/2022   Uric acid    Standing Status:   Future    Number of Occurrences:   1    Standing Expiration Date:   02/08/2022   Lactate dehydrogenase (LDH)    Standing Status:   Future    Number of Occurrences:   1    Standing Expiration Date:   02/08/2022   CMP ([place] only)    Standing Status:   Future    Number of Occurrences:   1    Standing Expiration Date:   02/08/2022   Phosphorus    Standing Status:   Future    Number of Occurrences:   1    Standing Expiration Date:   02/08/2022    All questions were answered. The patient knows to call the clinic with any problems, questions or concerns.  I have spent a total of 60 minutes minutes of face-to-face and non-face-to-face time, preparing to see the patient, obtaining and/or reviewing separately obtained history, performing a medically appropriate examination, counseling and educating the patient, ordering tests, documenting clinical information in the electronic health record, and care coordination.   Dede Query, PA-C Department of Hematology/Oncology [place] at [facility] Phone: [phone]  Patient was seen with Dr. Lorenso Courier.   I have read  the above note and personally examined the patient. I agree with the assessment and plan as noted above.  Briefly Frederick Pham is a 70 year old male who presents for evaluation of leukocytosis and thrombocytopenia.  At this time's findings are most consistent with either CLL or an indolent lymphoma.  The thrombocytopenia is likely secondary to splenomegaly.  At this time we will order flow cytometry, review peripheral blood film, and repeat CBC.  I do believe the patient would benefit from abdominal imaging in order to assure that splenomegaly is the cause of his thrombocytopenia.  If the patient is found to have CLL we can proceed with a prognostic panel.  If results are inconclusive would need an excisional lymph node biopsy in order to help confirm the diagnosis.  The patient voices understanding of this plan moving forward.   Ledell Peoples, MD Department of Hematology/Oncology [place] at [facility] Phone: [phone] Pager: [phone] Email: [email or web address]

## 2021-02-08 ENCOUNTER — Telehealth: Payer: Self-pay | Admitting: Physician Assistant

## 2021-02-08 ENCOUNTER — Inpatient Hospital Stay: Payer: Medicare Other

## 2021-02-08 ENCOUNTER — Other Ambulatory Visit: Payer: Self-pay

## 2021-02-08 ENCOUNTER — Inpatient Hospital Stay: Payer: Medicare Other | Attending: Physician Assistant | Admitting: Physician Assistant

## 2021-02-08 ENCOUNTER — Other Ambulatory Visit: Payer: Self-pay | Admitting: Physician Assistant

## 2021-02-08 ENCOUNTER — Encounter: Payer: Self-pay | Admitting: Physician Assistant

## 2021-02-08 VITALS — BP 132/65 | HR 73 | Temp 98.1°F | Resp 18 | Ht 65.0 in | Wt 139.1 lb

## 2021-02-08 DIAGNOSIS — D72820 Lymphocytosis (symptomatic): Secondary | ICD-10-CM

## 2021-02-08 DIAGNOSIS — C9110 Chronic lymphocytic leukemia of B-cell type not having achieved remission: Secondary | ICD-10-CM

## 2021-02-08 DIAGNOSIS — D72829 Elevated white blood cell count, unspecified: Secondary | ICD-10-CM

## 2021-02-08 DIAGNOSIS — Z87891 Personal history of nicotine dependence: Secondary | ICD-10-CM | POA: Insufficient documentation

## 2021-02-08 DIAGNOSIS — E875 Hyperkalemia: Secondary | ICD-10-CM | POA: Diagnosis not present

## 2021-02-08 DIAGNOSIS — D696 Thrombocytopenia, unspecified: Secondary | ICD-10-CM | POA: Diagnosis not present

## 2021-02-08 HISTORY — DX: Thrombocytopenia, unspecified: D69.6

## 2021-02-08 HISTORY — DX: Chronic lymphocytic leukemia of B-cell type not having achieved remission: C91.10

## 2021-02-08 LAB — CMP (CANCER CENTER ONLY)
ALT: 18 U/L (ref 0–44)
AST: 17 U/L (ref 15–41)
Albumin: 4.3 g/dL (ref 3.5–5.0)
Alkaline Phosphatase: 69 U/L (ref 38–126)
Anion gap: 7 (ref 5–15)
BUN: 25 mg/dL — ABNORMAL HIGH (ref 8–23)
CO2: 29 mmol/L (ref 22–32)
Calcium: 9.8 mg/dL (ref 8.9–10.3)
Chloride: 107 mmol/L (ref 98–111)
Creatinine: 1.25 mg/dL — ABNORMAL HIGH (ref 0.61–1.24)
GFR, Estimated: >60 mL/min (ref >60)
Glucose, Bld: 108 mg/dL — ABNORMAL HIGH (ref 70–99)
Potassium: 4.9 mmol/L (ref 3.5–5.1)
Sodium: 143 mmol/L (ref 135–145)
Total Bilirubin: 1.4 mg/dL — ABNORMAL HIGH (ref 0.3–1.2)
Total Protein: 6.7 g/dL (ref 6.5–8.1)

## 2021-02-08 LAB — CBC WITH DIFFERENTIAL (CANCER CENTER ONLY)
Abs Immature Granulocytes: 0.04 10*3/uL (ref 0.00–0.07)
Basophils Absolute: 0.1 10*3/uL (ref 0.0–0.1)
Basophils Relative: 0 %
Eosinophils Absolute: 0 10*3/uL (ref 0.0–0.5)
Eosinophils Relative: 0 %
HCT: 41.7 % (ref 39.0–52.0)
Hemoglobin: 14.1 g/dL (ref 13.0–17.0)
Immature Granulocytes: 0 %
Lymphocytes Relative: 93 %
Lymphs Abs: 43.4 10*3/uL — ABNORMAL HIGH (ref 0.7–4.0)
MCH: 30.9 pg (ref 26.0–34.0)
MCHC: 33.8 g/dL (ref 30.0–36.0)
MCV: 91.2 fL (ref 80.0–100.0)
Monocytes Absolute: 0.8 10*3/uL (ref 0.1–1.0)
Monocytes Relative: 2 %
Neutro Abs: 2.5 10*3/uL (ref 1.7–7.7)
Neutrophils Relative %: 5 %
Platelet Count: 113 10*3/uL — ABNORMAL LOW (ref 150–400)
RBC: 4.57 MIL/uL (ref 4.22–5.81)
RDW: 15.6 % — ABNORMAL HIGH (ref 11.5–15.5)
WBC Count: 46.7 10*3/uL — ABNORMAL HIGH (ref 4.0–10.5)
nRBC: 0 % (ref 0.0–0.2)

## 2021-02-08 LAB — URIC ACID: Uric Acid, Serum: 6.6 mg/dL (ref 3.7–8.6)

## 2021-02-08 LAB — PHOSPHORUS: Phosphorus: 2.5 mg/dL (ref 2.5–4.6)

## 2021-02-08 LAB — LACTATE DEHYDROGENASE: LDH: 119 U/L (ref 98–192)

## 2021-02-08 NOTE — Telephone Encounter (Signed)
Scheduled per 08/30 secure message, called and spoke with patient's spouse. Patient will be notified.

## 2021-02-09 LAB — SURGICAL PATHOLOGY

## 2021-02-10 ENCOUNTER — Telehealth: Payer: Self-pay | Admitting: Physician Assistant

## 2021-02-10 ENCOUNTER — Encounter: Payer: Self-pay | Admitting: Physician Assistant

## 2021-02-10 DIAGNOSIS — C9110 Chronic lymphocytic leukemia of B-cell type not having achieved remission: Secondary | ICD-10-CM

## 2021-02-10 LAB — FLOW CYTOMETRY

## 2021-02-11 ENCOUNTER — Other Ambulatory Visit: Payer: Self-pay | Admitting: Physician Assistant

## 2021-02-11 DIAGNOSIS — C9110 Chronic lymphocytic leukemia of B-cell type not having achieved remission: Secondary | ICD-10-CM

## 2021-02-11 NOTE — Telephone Encounter (Signed)
I called Frederick Pham and his wife to review the lab results from 02/08/2021. 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 We need additional prognostic labs that we will schedule on 02/18/2021 prior to CT scan. Patient will follow up with Dr. Lorenso Courier on 02/23/2021 to review CT imaging and discuss treatment options. Patient and his wife expressed understanding and satisfaction with the plan provided.

## 2021-02-18 ENCOUNTER — Ambulatory Visit (HOSPITAL_COMMUNITY)
Admission: RE | Admit: 2021-02-18 | Discharge: 2021-02-18 | Disposition: A | Discharge status: Home / Self Care | Payer: Medicare Other | Source: Ambulatory Visit | Attending: Physician Assistant | Admitting: Physician Assistant

## 2021-02-18 ENCOUNTER — Other Ambulatory Visit: Payer: Self-pay

## 2021-02-18 ENCOUNTER — Telehealth: Payer: Self-pay | Admitting: Physician Assistant

## 2021-02-18 ENCOUNTER — Inpatient Hospital Stay: Payer: Medicare Other | Attending: Physician Assistant

## 2021-02-18 DIAGNOSIS — C9110 Chronic lymphocytic leukemia of B-cell type not having achieved remission: Secondary | ICD-10-CM

## 2021-02-18 DIAGNOSIS — D72820 Lymphocytosis (symptomatic): Secondary | ICD-10-CM | POA: Insufficient documentation

## 2021-02-18 LAB — CBC WITH DIFFERENTIAL (CANCER CENTER ONLY)
Abs Immature Granulocytes: 0.07 10*3/uL (ref 0.00–0.07)
Basophils Absolute: 0.2 10*3/uL — ABNORMAL HIGH (ref 0.0–0.1)
Basophils Relative: 0 %
Eosinophils Absolute: 0 10*3/uL (ref 0.0–0.5)
Eosinophils Relative: 0 %
HCT: 41.4 % (ref 39.0–52.0)
Hemoglobin: 14 g/dL (ref 13.0–17.0)
Immature Granulocytes: 0 %
Lymphocytes Relative: 92 %
Lymphs Abs: 45.9 10*3/uL — ABNORMAL HIGH (ref 0.7–4.0)
MCH: 30.9 pg (ref 26.0–34.0)
MCHC: 33.8 g/dL (ref 30.0–36.0)
MCV: 91.4 fL (ref 80.0–100.0)
Monocytes Absolute: 0.8 10*3/uL (ref 0.1–1.0)
Monocytes Relative: 2 %
Neutro Abs: 2.8 10*3/uL (ref 1.7–7.7)
Neutrophils Relative %: 6 %
Platelet Count: 139 10*3/uL — ABNORMAL LOW (ref 150–400)
RBC: 4.53 MIL/uL (ref 4.22–5.81)
RDW: 15.7 % — ABNORMAL HIGH (ref 11.5–15.5)
WBC Count: 49.7 10*3/uL — ABNORMAL HIGH (ref 4.0–10.5)
nRBC: 0 % (ref 0.0–0.2)

## 2021-02-18 LAB — CMP (CANCER CENTER ONLY)
ALT: 16 U/L (ref 0–44)
AST: 14 U/L — ABNORMAL LOW (ref 15–41)
Albumin: 4.2 g/dL (ref 3.5–5.0)
Alkaline Phosphatase: 67 U/L (ref 38–126)
Anion gap: 11 (ref 5–15)
BUN: 28 mg/dL — ABNORMAL HIGH (ref 8–23)
CO2: 27 mmol/L (ref 22–32)
Calcium: 9.5 mg/dL (ref 8.9–10.3)
Chloride: 105 mmol/L (ref 98–111)
Creatinine: 1.33 mg/dL — ABNORMAL HIGH (ref 0.61–1.24)
GFR, Estimated: 58 mL/min — ABNORMAL LOW (ref >60)
Glucose, Bld: 107 mg/dL — ABNORMAL HIGH (ref 70–99)
Potassium: 4.4 mmol/L (ref 3.5–5.1)
Sodium: 143 mmol/L (ref 135–145)
Total Bilirubin: 1 mg/dL (ref 0.3–1.2)
Total Protein: 6.4 g/dL — ABNORMAL LOW (ref 6.5–8.1)

## 2021-02-18 MED ORDER — IOHEXOL 350 MG/ML SOLN
100.0000 mL | Freq: Once | INTRAVENOUS | Status: AC | PRN
  Administered 2021-02-18: 80 mL via INTRAVENOUS

## 2021-02-18 NOTE — Telephone Encounter (Signed)
Called patient regarding 09/14 appointment, spoke with patient's spouse. Patient will be notified.

## 2021-02-23 ENCOUNTER — Inpatient Hospital Stay (HOSPITAL_BASED_OUTPATIENT_CLINIC_OR_DEPARTMENT_OTHER): Payer: Medicare Other | Admitting: Hematology and Oncology

## 2021-02-23 ENCOUNTER — Other Ambulatory Visit: Payer: Medicare Other

## 2021-02-23 ENCOUNTER — Ambulatory Visit: Payer: Medicare Other | Admitting: Hematology and Oncology

## 2021-02-23 ENCOUNTER — Other Ambulatory Visit: Payer: Self-pay

## 2021-02-23 VITALS — BP 140/72 | HR 79 | Temp 98.1°F | Resp 17 | Wt 140.8 lb

## 2021-02-23 DIAGNOSIS — C9110 Chronic lymphocytic leukemia of B-cell type not having achieved remission: Secondary | ICD-10-CM | POA: Insufficient documentation

## 2021-02-23 DIAGNOSIS — D72820 Lymphocytosis (symptomatic): Secondary | ICD-10-CM | POA: Diagnosis not present

## 2021-02-23 LAB — FISH,CLL PROGNOSTIC PANEL

## 2021-02-23 NOTE — Progress Notes (Signed)
[place] [phone]   [phone]  PROGRESS NOTE  Patient Care Team: Yvonna Alanis, NP as PCP - General (Adult Health Nurse Practitioner) [name], DPM as Consulting Physician (Podiatry)  Hematological/Oncological History # Chronic Lymphocytic Leukemia, Rai Stage I 1) 01/21/2021: WBC 39.8 (H), Hgb 14.2, Plt 131 (L)  2) 02/08/2021: Establish care with Dede Query PA-C. WBC: 46.7, Hgb 14.1, Plt 113K. ALC 45,900. Flow cytometry confirms a monoclonal B cell population consistent with CLL.  3) 02/18/2021: CLL prognostic panel results show normal cytogenetics, intermediate risk. IgVH positive. CT C/A/P showed enlarged retroperitoneal, portacaval, bilateral iliac, and pelvic sidewall lymph nodes, no evidence of splenomegaly.   Interval History:  Frederick Pham 70 y.o. male with medical history significant for newly diagnosed CLL who presents for a follow up visit. The patient's last visit was on 02/08/2021 with Dede Query. In the interim since the last visit his flow cytometry has returned as suspicious for CLL and a CLL prognostic panel showed intermediate risk normal cytogenetics.   On exam today Frederick Pham is accompanied by his wife.  He reports that he has been well in the interim since her last visit.  He denies any lymphadenopathy, fevers, chills, sweats, nausea, vomiting or diarrhea.  He denies any enlarging of his spleen or abdominal fullness.  A full 10 point ROS is listed below.  The bulk of our discussion focused on the results of the imaging as well as the CLL prognostic panel.  MEDICAL HISTORY:  No past medical history on file.  SURGICAL HISTORY: Past Surgical History:  Procedure Laterality Date   FRACTURE SURGERY     age 17   TONSILLECTOMY     age 58   WISDOM TOOTH EXTRACTION      SOCIAL HISTORY: Social History   Socioeconomic History   Marital status: Married    Spouse name: Not on file   Number of children: Not on file    Years of education: Not on file   Highest education level: Not on file  Occupational History   Not on file  Tobacco Use   Smoking status: Former    Packs/day: 1.00    Years: 10.00    Pack years: 10.00    Types: Cigarettes    Quit date: 10/08/1990    Years since quitting: 30.4   Smokeless tobacco: Never  Vaping Use   Vaping Use: Never used  Substance and Sexual Activity   Alcohol use: Yes    Alcohol/week: 1.0 - 2.0 standard drink    Types: 1 - 2 Standard drinks or equivalent per week   Drug use: Never   Sexual activity: Not on file  Other Topics Concern   Not on file  Social History Narrative   Diet:      Caffeine: Yes      Married, if yes what year: Yes, 1977      Do you live in a house, apartment, assisted living, condo, trailer, ect: House      Is it one or more stories: 3      How many persons live in your home? 2      Pets: No      Highest level or education completed: 15      Current/Past profession: Chief Financial Officer, Teacher, English as a foreign language      Exercise: Yes                 Type and how often: weights- normally 5 days/week  Living Will: No   DNR: No   POA/HPOA: No      Functional Status:   Do you have difficulty bathing or dressing yourself? No   Do you have difficulty preparing food or eating? No   Do you have difficulty managing your medications? No   Do you have difficulty managing your finances? No   Do you have difficulty affording your medications? No   Social Determinants of Radio broadcast assistant Strain: Not on file  Food Insecurity: Not on file  Transportation Needs: Not on file  Physical Activity: Not on file  Stress: Not on file  Social Connections: Not on file  Intimate Partner Violence: Not on file    FAMILY HISTORY: Family History  Adopted: Yes    ALLERGIES:  has No Known Allergies.  MEDICATIONS:  Current Outpatient Medications  Medication Sig Dispense Refill   Ascorbic Acid (VITAMIN C) 1000 MG tablet Take 1,000 mg by mouth daily.      B Complex-Biotin-FA (B-COMPLEX PO) Take by mouth daily.     Cholecalciferol (VITAMIN D3 PO) Take 2,000 Units by mouth.     Omega-3 Fatty Acids (FISH OIL) 1200 MG CAPS Take 1,200 mg by mouth daily.     vitamin E 1000 UNIT capsule Take 1,000 Units by mouth daily.     No current facility-administered medications for this visit.    REVIEW OF SYSTEMS:   Constitutional: ( - ) fevers, ( - )  chills , ( - ) night sweats Eyes: ( - ) blurriness of vision, ( - ) double vision, ( - ) watery eyes Ears, nose, mouth, throat, and face: ( - ) mucositis, ( - ) sore throat Respiratory: ( - ) cough, ( - ) dyspnea, ( - ) wheezes Cardiovascular: ( - ) palpitation, ( - ) chest discomfort, ( - ) lower extremity swelling Gastrointestinal:  ( - ) nausea, ( - ) heartburn, ( - ) change in bowel habits Skin: ( - ) abnormal skin rashes Lymphatics: ( - ) new lymphadenopathy, ( - ) easy bruising Neurological: ( - ) numbness, ( - ) tingling, ( - ) new weaknesses Behavioral/Psych: ( - ) mood change, ( - ) new changes  All other systems were reviewed with the patient and are negative.  PHYSICAL EXAMINATION: ECOG PERFORMANCE STATUS: 0 - Asymptomatic  Vitals:   02/23/21 1027  BP: 140/72  Pulse: 79  Resp: 17  Temp: 98.1 F (36.7 C)  SpO2: 100%   Filed Weights   02/23/21 1027  Weight: 140 lb 12.8 oz (63.9 kg)    GENERAL: Well-appearing elderly Caucasian male, alert, no distress and comfortable SKIN: skin color, texture, turgor are normal, no rashes or significant lesions EYES: conjunctiva are pink and non-injected, sclera clear NECK: supple, non-tender LYMPH:  no palpable lymphadenopathy in the cervical, axillary or inguinal LUNGS: clear to auscultation and percussion with normal breathing effort HEART: regular rate & rhythm and no murmurs and no lower extremity edema Musculoskeletal: no cyanosis of digits and no clubbing  PSYCH: alert & oriented x 3, fluent speech NEURO: no focal motor/sensory  deficits  LABORATORY DATA:  I have reviewed the data as listed CBC Latest Ref Rng & Units 02/18/2021 02/08/2021 01/21/2021  WBC 4.0 - 10.5 K/uL 49.7(H) 46.7(H) 39.8(H)  Hemoglobin 13.0 - 17.0 g/dL 14.0 14.1 14.2  Hematocrit 39.0 - 52.0 % 41.4 41.7 43.9  Platelets 150 - 400 K/uL 139(L) 113(L) 131(L)    CMP Latest Ref Rng & Units 02/18/2021  02/08/2021 01/21/2021  Glucose 70 - 99 mg/dL 107(H) 108(H) 58(L)  BUN 8 - 23 mg/dL 28(H) 25(H) 24(H)  Creatinine 0.61 - 1.24 mg/dL 1.33(H) 1.25(H) 1.09  Sodium 135 - 145 mmol/L 143 143 138  Potassium 3.5 - 5.1 mmol/L 4.4 4.9 7.2(HH)  Chloride 98 - 111 mmol/L 105 107 102  CO2 22 - 32 mmol/L '27 29 27  '$ Calcium 8.9 - 10.3 mg/dL 9.5 9.8 9.4  Total Protein 6.5 - 8.1 g/dL 6.4(L) 6.7 6.8  Total Bilirubin 0.3 - 1.2 mg/dL 1.0 1.4(H) 1.6(H)  Alkaline Phos 38 - 126 U/L 67 69 59  AST 15 - 41 U/L 14(L) 17 51(H)  ALT 0 - 44 U/L '16 18 25    '$ RADIOGRAPHIC STUDIES: I have personally reviewed the radiological images as listed and agreed with the findings in the report: lymphadenopathy with no splenomegaly.  CT CHEST ABDOMEN PELVIS W CONTRAST  Result Date: 02/21/2021 CLINICAL DATA:  Lymphocytosis EXAM: CT CHEST, ABDOMEN, AND PELVIS WITH CONTRAST TECHNIQUE: Multidetector CT imaging of the chest, abdomen and pelvis was performed following the standard protocol during bolus administration of intravenous contrast. CONTRAST:  67m OMNIPAQUE IOHEXOL 350 MG/ML SOLN, additional oral enteric contrast COMPARISON:  None. FINDINGS: CT CHEST FINDINGS Cardiovascular: Aortic atherosclerosis. Normal heart size. Left and right coronary artery calcifications. No pericardial effusion. Mediastinum/Nodes: No enlarged mediastinal, hilar, or axillary lymph nodes. Thyroid gland, trachea, and esophagus demonstrate no significant findings. Lungs/Pleura: Lungs are clear. No pleural effusion or pneumothorax. Musculoskeletal: No chest wall mass or suspicious bone lesions identified. CT ABDOMEN PELVIS  FINDINGS Hepatobiliary: No solid liver abnormality is seen. No gallstones, gallbladder wall thickening, or biliary dilatation. Pancreas: Unremarkable. No pancreatic ductal dilatation or surrounding inflammatory changes. Spleen: Normal in size without significant abnormality. Adrenals/Urinary Tract: There is a soft tissue attenuation left adrenal nodule measuring 2.8 x 1.9 cm (series 2, image 58). Kidneys are normal, without renal calculi, solid lesion, or hydronephrosis. Bladder is unremarkable. Stomach/Bowel: Stomach is within normal limits. Appendix appears normal. No evidence of bowel wall thickening, distention, or inflammatory changes. Vascular/Lymphatic: Aortic atherosclerosis. Enlarged, matted appearing retroperitoneal lymph nodes, largest left retroperitoneal node measuring 2.0 x 1.1 cm (series 2, image 69). Enlarged portacaval lymph node measuring 2.9 x 2.0 cm (series 2, image 62). Enlarged bilateral iliac and pelvic sidewall lymph nodes, largest left pelvic sidewall node measuring 2.5 x 1.3 cm (series 2, image 102). Reproductive: Prostatomegaly with median lobe hypertrophy. Other: No abdominal wall hernia or abnormality. No abdominopelvic ascites. Musculoskeletal: No acute or significant osseous findings. IMPRESSION: 1. Enlarged retroperitoneal, portacaval, bilateral iliac, and pelvic sidewall lymph nodes, concerning for lymphoma or nodal metastatic disease. 2. There is a soft tissue attenuation left adrenal nodule measuring 2.8 x 1.9 cm. Although statistically this is most likely a benign, incidental adenoma, in the presence of other findings suspicious for metastatic disease, this is suspicious for a metastasis. 3. Prostatomegaly. Metastatic prostate malignancy is a differential consideration given this finding. 4. Coronary artery disease. Aortic Atherosclerosis (ICD10-I70.0). Electronically Signed   By: AEddie CandleM.D.   On: 02/21/2021 11:51    ASSESSMENT & PLAN Frederick Pham 70y.o. male with  medical history significant for newly diagnosed CLL who presents for a follow up visit.   At this time the patient's findings are most consistent with a Rai stage I CLL.  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  At this  time I recommend close observation with a repeat clinic visit in 3 months time.  If he is stable at that time we can see the patient every 6 months in order to evaluate his blood counts and clinical findings.  # Chronic Lymphocytic Leukemia, Rai Stage I --patient is Stage I based on lymphocytosis and lymphadenopathy --no indication for treatment at this time, patient has modest thrombocytopenia and no anemia --prognostic panel shows normal cytogenetics, intermediate risk. 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  --recommend continued observation with a return clinic visit in 3 months time.   No orders of the defined types were placed in this encounter.  All questions were answered. The patient knows to call the clinic with any problems, questions or concerns.  A total of more than 30 minutes were spent on this encounter with face-to-face time and non-face-to-face time, including preparing to see the patient, ordering tests and/or medications, counseling the patient and coordination of care as outlined above.   Ledell Peoples, MD Department of Hematology/Oncology [place] at [facility] Phone: [phone] Pager: [phone] Email: [email or web address]  03/01/2021 4:01 PM

## 2021-02-28 LAB — IGVH SOMATIC HYPERMUTATION

## 2021-03-10 ENCOUNTER — Encounter: Payer: Self-pay | Admitting: Orthopedic Surgery

## 2021-03-17 ENCOUNTER — Ambulatory Visit (INDEPENDENT_AMBULATORY_CARE_PROVIDER_SITE_OTHER): Payer: Medicare Other | Admitting: Orthopedic Surgery

## 2021-03-17 ENCOUNTER — Other Ambulatory Visit: Payer: Self-pay

## 2021-03-17 ENCOUNTER — Other Ambulatory Visit (INDEPENDENT_AMBULATORY_CARE_PROVIDER_SITE_OTHER): Payer: Medicare Other

## 2021-03-17 ENCOUNTER — Encounter: Payer: Self-pay | Admitting: Orthopedic Surgery

## 2021-03-17 VITALS — BP 130/70 | HR 76 | Temp 97.7°F | Ht 65.0 in | Wt 141.0 lb

## 2021-03-17 DIAGNOSIS — M25561 Pain in right knee: Secondary | ICD-10-CM | POA: Diagnosis not present

## 2021-03-17 DIAGNOSIS — C9110 Chronic lymphocytic leukemia of B-cell type not having achieved remission: Secondary | ICD-10-CM

## 2021-03-17 DIAGNOSIS — Z0000 Encounter for general adult medical examination without abnormal findings: Secondary | ICD-10-CM | POA: Diagnosis not present

## 2021-03-17 DIAGNOSIS — R251 Tremor, unspecified: Secondary | ICD-10-CM

## 2021-03-17 DIAGNOSIS — B351 Tinea unguium: Secondary | ICD-10-CM

## 2021-03-17 DIAGNOSIS — G8929 Other chronic pain: Secondary | ICD-10-CM

## 2021-03-17 DIAGNOSIS — D696 Thrombocytopenia, unspecified: Secondary | ICD-10-CM

## 2021-03-17 DIAGNOSIS — G629 Polyneuropathy, unspecified: Secondary | ICD-10-CM | POA: Diagnosis not present

## 2021-03-17 MED ORDER — GABAPENTIN 100 MG PO CAPS: 100.0000 mg | ORAL_CAPSULE | Freq: Three times a day (TID) | ORAL | 3 refills | Status: DC

## 2021-03-17 NOTE — Progress Notes (Signed)
Careteam: Patient Care Team: Frederick Alanis, NP as PCP - General (Adult Health Nurse Practitioner) [name], DPM as Consulting Physician (Podiatry)  Seen by: [name], AGNP-C  PLACE OF SERVICE:  [place] Directive information    No Known Allergies  Chief Complaint  Patient presents with   Medical Management of Chronic Issues    Patient presents today for a 2 month follow-up.   Quality Metric Gaps    Hep C screening, Colonoscopy,TDAP, Flu, zoster and COVID booster vaccines.     HPI: Patient is a 70 y.o. male seen today for medical management of chronic conditions.   Wife present during encounter.   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   Knee pain- followed by Dr. Wynelle Link, he has not followed up with him since CLL diagnosis, reports improved right knee pain, worried right knee will get worse over time, would still like to have knee replacement, plans to schedule f/u with ortho soon  Neuropathy- in the past few weeks he has had increased neuropathy to his feet, left worse than right, intermittent all day, he would like to try small dose gabapentin.   Toenails still have fungus. Lamisil prescription sent in august, he has not started yet.   He has started lifting weights and using exercise machine again. Denies pain or soreness.   Wife reports hand tremor and left foot drop. Hand tremor not observed today, but states it occurs a few times daily. He denies issues hand tremor or foot. Neuro referral requested.   EKG performed today- NSR.   MMSE 30/30 today, completed clock and shapes.     Review of Systems:  Review of Systems  Constitutional:  Negative for chills, fever and weight loss.  HENT:  Negative for hearing loss and sore throat.   Eyes:  Negative for blurred vision and double vision.  Respiratory:  Negative for  cough, shortness of breath and wheezing.   Cardiovascular:  Negative for chest pain and leg swelling.  Gastrointestinal:  Negative for abdominal pain, constipation, diarrhea, heartburn, nausea and vomiting.  Genitourinary:  Negative for dysuria, frequency and hematuria.  Musculoskeletal:  Negative for falls.       Right knee pain  Neurological:  Positive for tingling. Negative for dizziness, weakness and headaches.  Psychiatric/Behavioral:  Negative for depression. The patient is not nervous/anxious and does not have insomnia.    No past medical history on file. Past Surgical History:  Procedure Laterality Date   FRACTURE SURGERY     age 74   TONSILLECTOMY     age 62   WISDOM TOOTH EXTRACTION     Social History:   reports that he quit smoking about 30 years ago. His smoking use included cigarettes. He has a 10.00 pack-year smoking history. He has never used smokeless tobacco. He reports current alcohol use of about 1.0 - 2.0 standard drink per week. He reports that he does not use drugs.  Family History  Adopted: Yes    Medications: Patient's Medications  New Prescriptions   No medications on file  Previous Medications   ASCORBIC ACID (VITAMIN C) 1000 MG TABLET    Take 1,000 mg by mouth daily.   B COMPLEX-BIOTIN-FA (B-COMPLEX PO)    Take by mouth daily.   CHOLECALCIFEROL (VITAMIN D3 PO)    Take 2,000 Units by mouth.  OMEGA-3 FATTY ACIDS (FISH OIL) 1200 MG CAPS    Take 1,200 mg by mouth daily.   VITAMIN E 1000 UNIT CAPSULE    Take 1,000 Units by mouth daily.  Modified Medications   No medications on file  Discontinued Medications   No medications on file    Physical Exam:  Vitals:   03/17/21 0953  BP: 130/70  Pulse: 76  Temp: 97.7 F (36.5 C)  SpO2: 98%  Weight: 141 lb (64 kg)  Height: 5\' 5"  (1.651 m)   Body mass index is 23.46 kg/m. Wt Readings from Last 3 Encounters:  03/17/21 141 lb (64 kg)  02/23/21 140 lb 12.8 oz (63.9 kg)  02/08/21 139 lb 1.6 oz (63.1 kg)     Physical Exam Vitals reviewed.  Constitutional:      General: He is not in acute distress. HENT:     Head: Normocephalic.     Nose: Nose normal.     Mouth/Throat:     Mouth: Mucous membranes are moist.  Eyes:     General:        Right eye: No discharge.        Left eye: No discharge.  Neck:     Vascular: No carotid bruit.  Cardiovascular:     Rate and Rhythm: Normal rate and regular rhythm.     Pulses: Normal pulses.     Heart sounds: Normal heart sounds. No murmur heard. Pulmonary:     Effort: Pulmonary effort is normal. No respiratory distress.     Breath sounds: Normal breath sounds. No wheezing.  Abdominal:     General: Bowel sounds are normal. There is no distension.     Palpations: Abdomen is soft.     Tenderness: There is no abdominal tenderness.  Musculoskeletal:     Cervical back: Normal range of motion.     Right lower leg: No edema.     Left lower leg: No edema.  Lymphadenopathy:     Cervical: No cervical adenopathy.  Skin:    General: Skin is warm and dry.     Capillary Refill: Capillary refill takes less than 2 seconds.  Neurological:     General: No focal deficit present.     Mental Status: He is alert and oriented to person, place, and time.     Sensory: Sensation is intact.     Motor: Motor function is intact. No weakness.     Coordination: Coordination is intact.     Gait: Gait is intact. Gait normal.     Comments: No hand tremor observed, gait normal  Psychiatric:        Mood and Affect: Mood normal.        Behavior: Behavior normal.    Labs reviewed: Basic Metabolic Panel: Recent Labs    01/21/21 1330 02/08/21 1201 02/18/21 1350  NA 138 143 143  K 7.2* 4.9 4.4  CL 102 107 105  CO2 27 29 27   GLUCOSE 58* 108* 107*  BUN 24* 25* 28*  CREATININE 1.09 1.25* 1.33*  CALCIUM 9.4 9.8 9.5  PHOS  --  2.5  --    Liver Function Tests: Recent Labs    01/21/21 1330 02/08/21 1201 02/18/21 1350  AST 51* 17 14*  ALT 25 18 16   ALKPHOS 59  69 67  BILITOT 1.6* 1.4* 1.0  PROT 6.8 6.7 6.4*  ALBUMIN 4.2 4.3 4.2   No results for input(s): LIPASE, AMYLASE in the last 8760 hours. No results for input(s): AMMONIA  in the last 8760 hours. CBC: Recent Labs    01/21/21 1330 02/08/21 1014 02/18/21 1350  WBC 39.8* 46.7* 49.7*  NEUTROABS  --  2.5 2.8  HGB 14.2 14.1 14.0  HCT 43.9 41.7 41.4  MCV 94.6 91.2 91.4  PLT 131* 113* 139*   Lipid Panel: No results for input(s): CHOL, HDL, LDLCALC, TRIG, CHOLHDL, LDLDIRECT in the last 8760 hours. TSH: No results for input(s): TSH in the last 8760 hours. A1C: No results found for: HGBA1C   Assessment/Plan 1. Annual physical exam - EKG 12-Lead- NSR - Lipid panel- LDL 117   2. Neuropathy - left foot worse than right - began about a month ago - exam unremarkable - gabapentin (NEURONTIN) 100 MG capsule; Take 1 capsule (100 mg total) by mouth 3 (three) times daily.  Dispense: 90 capsule; Refill: 3  3. Tremor of left hand - not observed today - Ambulatory referral to Neurology  4. Chronic pain of right knee - right TKR cancelled due to elevated white count - pain improved with increased movement, exercising again - plan to f/u with Dr. Wynelle Link soon   5. CLL (chronic lymphocytic leukemia) ([place]) - followed by oncology - believed to be stage I - remains asymptomatic - plan for f/u every 3 months  6. Thrombocytopenia ([facility]) - platelets 139 02/18/2021  7. Onychomycosis - recently seen by podiatry - Lamisil prescription given, but not started  Total time: 38 minutes. Greater than 50% of total time spent doing patient education on symptom and medication management regarding neuropathy and knee pain.    Next appt: Visit date not found  [place], [facility] [phone]

## 2021-03-18 LAB — LIPID PANEL
Cholesterol: 195 mg/dL (ref <200)
HDL: 68 mg/dL (ref >=40)
LDL Cholesterol (Calc): 117 mg/dL (calc) — ABNORMAL HIGH
Non-HDL Cholesterol (Calc): 127 mg/dL (calc) (ref <130)
Total CHOL/HDL Ratio: 2.9 (calc) (ref <5.0)
Triglycerides: 34 mg/dL (ref <150)

## 2021-04-21 ENCOUNTER — Encounter: Payer: Self-pay | Admitting: Orthopedic Surgery

## 2021-04-21 ENCOUNTER — Ambulatory Visit (INDEPENDENT_AMBULATORY_CARE_PROVIDER_SITE_OTHER): Payer: Medicare Other | Admitting: Orthopedic Surgery

## 2021-04-21 ENCOUNTER — Ambulatory Visit: Payer: Medicare Other | Admitting: Podiatry

## 2021-04-21 ENCOUNTER — Other Ambulatory Visit: Payer: Self-pay

## 2021-04-21 VITALS — BP 140/90 | HR 83 | Temp 97.8°F | Ht 65.0 in | Wt 144.3 lb

## 2021-04-21 DIAGNOSIS — D229 Melanocytic nevi, unspecified: Secondary | ICD-10-CM

## 2021-04-21 NOTE — Telephone Encounter (Signed)
Scheduled patient an appointment to have checked.  Appointment 11/10 with Amy.

## 2021-04-21 NOTE — Progress Notes (Signed)
Careteam: Patient Care Team: Yvonna Alanis, NP as PCP - General (Adult Health Nurse Practitioner) [name], DPM as Consulting Physician (Podiatry)  Seen by: [name], AGNP-C  PLACE OF SERVICE:  [place] Directive information    No Known Allergies  Chief Complaint  Patient presents with   Acute Visit    Patient presents today for a skin check. He reports a spot upper left back for a month now. He states it was a rash first and he starched the area. Denies itchy or painful and haven't placed anything on the area.     HPI: Patient is a 70 y.o. male seen today for scute visit due to atypical mole.   He presents with atypical mole to left upper shoulder blade. Reports he noticed mole a few years ago. Unsure if it has changed in color, shape and size. Painless. No drainage. Wife noticed area and suggested he be seen by provider today. He is not followed by a dermatologist. Admits to intermittent use of sunscreen.   Doing well with right knee. Plans to see Dr. Wynelle Link soon.       Review of Systems:  Review of Systems  Constitutional:  Negative for chills, fever, malaise/fatigue and weight loss.  Respiratory:  Negative for cough, shortness of breath and wheezing.   Cardiovascular:  Negative for chest pain and leg swelling.  Skin:        Mole/lesion  Psychiatric/Behavioral:  Negative for depression. The patient is not nervous/anxious.    History reviewed. No pertinent past medical history. Past Surgical History:  Procedure Laterality Date   FRACTURE SURGERY     age 78   TONSILLECTOMY     age 75   WISDOM TOOTH EXTRACTION     Social History:   reports that he quit smoking about 30 years ago. His smoking use included cigarettes. He has a 10.00 pack-year smoking history. He has never used smokeless tobacco. He reports current alcohol use of about 1.0 - 2.0 standard drink per week. He reports that he does not use drugs.  Family History  Adopted: Yes     Medications: Patient's Medications  New Prescriptions   No medications on file  Previous Medications   ASCORBIC ACID (VITAMIN C) 1000 MG TABLET    Take 1,000 mg by mouth daily.   B COMPLEX-BIOTIN-FA (B-COMPLEX PO)    Take by mouth daily.   CHOLECALCIFEROL (VITAMIN D3 PO)    Take 2,000 Units by mouth.   GABAPENTIN (NEURONTIN) 100 MG CAPSULE    Take 1 capsule (100 mg total) by mouth 3 (three) times daily.   MAGNESIUM PO    Take by mouth.   OMEGA-3 FATTY ACIDS (FISH OIL) 1200 MG CAPS    Take 1,200 mg by mouth daily.   VITAMIN E 1000 UNIT CAPSULE    Take 1,000 Units by mouth daily.  Modified Medications   No medications on file  Discontinued Medications   No medications on file    Physical Exam:  Vitals:   04/21/21 1502  BP: 140/90  Pulse: 83  Temp: 97.8 F (36.6 C)  SpO2: 98%  Weight: 144 lb 4.8 oz (65.5 kg)  Height: 5\' 5"  (1.651 m)   Body mass index is 24.01 kg/m. Wt Readings from Last 3 Encounters:  04/21/21 144 lb 4.8 oz (65.5 kg)  03/17/21 141 lb (64 kg)  02/23/21 140 lb 12.8 oz (63.9 kg)    Physical Exam Vitals reviewed.  Constitutional:  General: He is not in acute distress. HENT:     Head: Normocephalic.  Cardiovascular:     Rate and Rhythm: Normal rate and regular rhythm.     Pulses: Normal pulses.     Heart sounds: Normal heart sounds. No murmur heard. Pulmonary:     Effort: Pulmonary effort is normal. No respiratory distress.     Breath sounds: Normal breath sounds. No wheezing.  Skin:    General: Skin is warm and dry.     Comments: Dime sized papule to left upper shoulder, red/pearly appearance, raised, irregular shape, erosion/crusting center, no drainage, non tender.   Neurological:     General: No focal deficit present.     Mental Status: He is alert and oriented to person, place, and time.  Psychiatric:        Mood and Affect: Mood normal.        Behavior: Behavior normal.    Labs reviewed: Basic Metabolic Panel: Recent Labs     01/21/21 1330 02/08/21 1201 02/18/21 1350  NA 138 143 143  K 7.2* 4.9 4.4  CL 102 107 105  CO2 27 29 27   GLUCOSE 58* 108* 107*  BUN 24* 25* 28*  CREATININE 1.09 1.25* 1.33*  CALCIUM 9.4 9.8 9.5  PHOS  --  2.5  --    Liver Function Tests: Recent Labs    01/21/21 1330 02/08/21 1201 02/18/21 1350  AST 51* 17 14*  ALT 25 18 16   ALKPHOS 59 69 67  BILITOT 1.6* 1.4* 1.0  PROT 6.8 6.7 6.4*  ALBUMIN 4.2 4.3 4.2   No results for input(s): LIPASE, AMYLASE in the last 8760 hours. No results for input(s): AMMONIA in the last 8760 hours. CBC: Recent Labs    01/21/21 1330 02/08/21 1014 02/18/21 1350  WBC 39.8* 46.7* 49.7*  NEUTROABS  --  2.5 2.8  HGB 14.2 14.1 14.0  HCT 43.9 41.7 41.4  MCV 94.6 91.2 91.4  PLT 131* 113* 139*   Lipid Panel: Recent Labs    03/17/21 1110  CHOL 195  HDL 68  LDLCALC 117*  TRIG 34  CHOLHDL 2.9   TSH: No results for input(s): TSH in the last 8760 hours. A1C: No results found for: HGBA1C   Assessment/Plan 1. Atypical mole - mole characteristics suspicious for malignancy- basal cell carcinoma? - Ambulatory referral to Dermatology- recommend Dr. Allyson Sabal due to availability - contact PCP if issues scheduling dermatology appointment - recommend daily sunscreen use  Total time: 12 minutes. Greater than 50% of total time spent doing patient education on symptom treatment/management/prevention.    Next appt: 09/15/2021  [name], [facility] [phone]

## 2021-04-21 NOTE — Patient Instructions (Signed)
Dr. Allyson Sabal [phone]

## 2021-04-22 DIAGNOSIS — D229 Melanocytic nevi, unspecified: Secondary | ICD-10-CM | POA: Insufficient documentation

## 2021-05-26 ENCOUNTER — Inpatient Hospital Stay: Payer: Medicare Other

## 2021-05-26 ENCOUNTER — Inpatient Hospital Stay: Payer: Medicare Other | Attending: Hematology and Oncology | Admitting: Hematology and Oncology

## 2021-05-26 ENCOUNTER — Other Ambulatory Visit: Payer: Self-pay

## 2021-05-26 ENCOUNTER — Other Ambulatory Visit: Payer: Self-pay | Admitting: Hematology and Oncology

## 2021-05-26 VITALS — BP 148/65 | HR 78 | Temp 98.2°F | Resp 17 | Wt 147.1 lb

## 2021-05-26 DIAGNOSIS — D696 Thrombocytopenia, unspecified: Secondary | ICD-10-CM

## 2021-05-26 DIAGNOSIS — Z87891 Personal history of nicotine dependence: Secondary | ICD-10-CM | POA: Insufficient documentation

## 2021-05-26 DIAGNOSIS — Z79899 Other long term (current) drug therapy: Secondary | ICD-10-CM | POA: Diagnosis not present

## 2021-05-26 DIAGNOSIS — D72820 Lymphocytosis (symptomatic): Secondary | ICD-10-CM | POA: Diagnosis not present

## 2021-05-26 DIAGNOSIS — C9110 Chronic lymphocytic leukemia of B-cell type not having achieved remission: Secondary | ICD-10-CM

## 2021-05-26 LAB — CBC WITH DIFFERENTIAL (CANCER CENTER ONLY)
Abs Immature Granulocytes: 0.04 10*3/uL (ref 0.00–0.07)
Basophils Absolute: 0 10*3/uL (ref 0.0–0.1)
Basophils Relative: 0 %
Eosinophils Absolute: 0 10*3/uL (ref 0.0–0.5)
Eosinophils Relative: 0 %
HCT: 39.8 % (ref 39.0–52.0)
Hemoglobin: 13.3 g/dL (ref 13.0–17.0)
Immature Granulocytes: 0 %
Lymphocytes Relative: 95 %
Lymphs Abs: 46.2 10*3/uL — ABNORMAL HIGH (ref 0.7–4.0)
MCH: 31.7 pg (ref 26.0–34.0)
MCHC: 33.4 g/dL (ref 30.0–36.0)
MCV: 95 fL (ref 80.0–100.0)
Monocytes Absolute: 0.2 10*3/uL (ref 0.1–1.0)
Monocytes Relative: 0 %
Neutro Abs: 2.4 10*3/uL (ref 1.7–7.7)
Neutrophils Relative %: 5 %
Platelet Count: 90 10*3/uL — ABNORMAL LOW (ref 150–400)
RBC: 4.19 MIL/uL — ABNORMAL LOW (ref 4.22–5.81)
RDW: 14.4 % (ref 11.5–15.5)
Smear Review: NORMAL
WBC Count: 48.8 10*3/uL — ABNORMAL HIGH (ref 4.0–10.5)
nRBC: 0 % (ref 0.0–0.2)

## 2021-05-26 LAB — CMP (CANCER CENTER ONLY)
ALT: 20 U/L (ref 0–44)
AST: 19 U/L (ref 15–41)
Albumin: 3.9 g/dL (ref 3.5–5.0)
Alkaline Phosphatase: 57 U/L (ref 38–126)
Anion gap: 8 (ref 5–15)
BUN: 30 mg/dL — ABNORMAL HIGH (ref 8–23)
CO2: 27 mmol/L (ref 22–32)
Calcium: 9.1 mg/dL (ref 8.9–10.3)
Chloride: 107 mmol/L (ref 98–111)
Creatinine: 1.23 mg/dL (ref 0.61–1.24)
GFR, Estimated: >60 mL/min (ref >60)
Glucose, Bld: 114 mg/dL — ABNORMAL HIGH (ref 70–99)
Potassium: 4.9 mmol/L (ref 3.5–5.1)
Sodium: 142 mmol/L (ref 135–145)
Total Bilirubin: 0.8 mg/dL (ref 0.3–1.2)
Total Protein: 6.2 g/dL — ABNORMAL LOW (ref 6.5–8.1)

## 2021-05-26 LAB — LACTATE DEHYDROGENASE: LDH: 114 U/L (ref 98–192)

## 2021-05-26 NOTE — Progress Notes (Signed)
[place] [phone]   [phone]  PROGRESS NOTE  Patient Care Team: Yvonna Alanis, NP as PCP - General (Adult Health Nurse Practitioner) [name], DPM as Consulting Physician (Podiatry)  Hematological/Oncological History # Chronic Lymphocytic Leukemia, Rai Stage I 1) 01/21/2021: WBC 39.8 (H), Hgb 14.2, Plt 131 (L)  2) 02/08/2021: Establish care with Dede Query PA-C. WBC: 46.7, Hgb 14.1, Plt 113K. ALC 45,900. Flow cytometry confirms a monoclonal B cell population consistent with CLL.  3) 02/18/2021: CLL prognostic panel results show normal cytogenetics, intermediate risk. IgVH positive. CT C/A/P showed enlarged retroperitoneal, portacaval, bilateral iliac, and pelvic sidewall lymph nodes, no evidence of splenomegaly.  05/26/2021: WBC 48.8, Hgb 13.3, MCV 95, Plt 90  Interval History:  Frederick Pham 70 y.o. male with medical history significant for newly diagnosed CLL who presents for a follow up visit. The patient's last visit was on 02/23/2021. In the interim since the last visit he has had no major changes in his health.   On exam today Frederick Pham is accompanied by his wife.  He reports that he has been well in the interim since her last visit.  He has had no major changes in his health.  He continues to have knee pain and looks forward to his upcoming knee surgery.  He notes that his appetite and energy levels have been excellent.  He notes only his knee is slowing him down at this time.  He denies any bleeding, bruising, or dark stools.  He denies any lymphadenopathy, fevers, chills, sweats, nausea, vomiting or diarrhea.  He denies any enlarging of his spleen or abdominal fullness.  A full 10 point ROS is listed below.  MEDICAL HISTORY:  No past medical history on file.  SURGICAL HISTORY: Past Surgical History:  Procedure Laterality Date   FRACTURE SURGERY     age 12   TONSILLECTOMY     age 35   WISDOM TOOTH EXTRACTION      SOCIAL  HISTORY: Social History   Socioeconomic History   Marital status: Married    Spouse name: Not on file   Number of children: Not on file   Years of education: Not on file   Highest education level: Not on file  Occupational History   Not on file  Tobacco Use   Smoking status: Former    Packs/day: 1.00    Years: 10.00    Pack years: 10.00    Types: Cigarettes    Quit date: 10/08/1990    Years since quitting: 30.6   Smokeless tobacco: Never  Vaping Use   Vaping Use: Never used  Substance and Sexual Activity   Alcohol use: Yes    Alcohol/week: 1.0 - 2.0 standard drink    Types: 1 - 2 Standard drinks or equivalent per week   Drug use: Never   Sexual activity: Not on file  Other Topics Concern   Not on file  Social History Narrative   Diet:      Caffeine: Yes      Married, if yes what year: Yes, 1977      Do you live in a house, apartment, assisted living, condo, trailer, ect: House      Is it one or more stories: 3      How many persons live in your home? 2      Pets: No      Highest level or education completed: 15      Current/Past profession: Chief Financial Officer, Teacher, English as a foreign language  Exercise: Yes                 Type and how often: weights- normally 5 days/week         Living Will: No   DNR: No   POA/HPOA: No      Functional Status:   Do you have difficulty bathing or dressing yourself? No   Do you have difficulty preparing food or eating? No   Do you have difficulty managing your medications? No   Do you have difficulty managing your finances? No   Do you have difficulty affording your medications? No   Social Determinants of Radio broadcast assistant Strain: Not on file  Food Insecurity: Not on file  Transportation Needs: Not on file  Physical Activity: Not on file  Stress: Not on file  Social Connections: Not on file  Intimate Partner Violence: Not on file    FAMILY HISTORY: Family History  Adopted: Yes    ALLERGIES:  has No Known Allergies.  MEDICATIONS:   Current Outpatient Medications  Medication Sig Dispense Refill   Ascorbic Acid (VITAMIN C) 1000 MG tablet Take 1,000 mg by mouth daily.     B Complex-Biotin-FA (B-COMPLEX PO) Take by mouth daily.     Cholecalciferol (VITAMIN D3 PO) Take 2,000 Units by mouth.     gabapentin (NEURONTIN) 100 MG capsule Take 1 capsule (100 mg total) by mouth 3 (three) times daily. 90 capsule 3   MAGNESIUM PO Take by mouth.     Omega-3 Fatty Acids (FISH OIL) 1200 MG CAPS Take 1,200 mg by mouth daily.     vitamin E 1000 UNIT capsule Take 1,000 Units by mouth daily.     No current facility-administered medications for this visit.    REVIEW OF SYSTEMS:   Constitutional: ( - ) fevers, ( - )  chills , ( - ) night sweats Eyes: ( - ) blurriness of vision, ( - ) double vision, ( - ) watery eyes Ears, nose, mouth, throat, and face: ( - ) mucositis, ( - ) sore throat Respiratory: ( - ) cough, ( - ) dyspnea, ( - ) wheezes Cardiovascular: ( - ) palpitation, ( - ) chest discomfort, ( - ) lower extremity swelling Gastrointestinal:  ( - ) nausea, ( - ) heartburn, ( - ) change in bowel habits Skin: ( - ) abnormal skin rashes Lymphatics: ( - ) new lymphadenopathy, ( - ) easy bruising Neurological: ( - ) numbness, ( - ) tingling, ( - ) new weaknesses Behavioral/Psych: ( - ) mood change, ( - ) new changes  All other systems were reviewed with the patient and are negative.  PHYSICAL EXAMINATION: ECOG PERFORMANCE STATUS: 0 - Asymptomatic  Vitals:   05/26/21 1109  BP: (!) 148/65  Pulse: 78  Resp: 17  Temp: 98.2 F (36.8 C)  SpO2: 99%   Filed Weights   05/26/21 1109  Weight: 147 lb 1.6 oz (66.7 kg)    GENERAL: Well-appearing elderly Caucasian male, alert, no distress and comfortable SKIN: skin color, texture, turgor are normal, no rashes or significant lesions EYES: conjunctiva are pink and non-injected, sclera clear NECK: supple, non-tender LYMPH:  no palpable lymphadenopathy in the cervical, axillary or  inguinal LUNGS: clear to auscultation and percussion with normal breathing effort HEART: regular rate & rhythm and no murmurs and no lower extremity edema Musculoskeletal: no cyanosis of digits and no clubbing  PSYCH: alert & oriented x 3, fluent speech NEURO: no focal motor/sensory deficits  LABORATORY DATA:  I have reviewed the data as listed CBC Latest Ref Rng & Units 05/26/2021 02/18/2021 02/08/2021  WBC 4.0 - 10.5 K/uL 48.8(H) 49.7(H) 46.7(H)  Hemoglobin 13.0 - 17.0 g/dL 13.3 14.0 14.1  Hematocrit 39.0 - 52.0 % 39.8 41.4 41.7  Platelets 150 - 400 K/uL 90(L) 139(L) 113(L)    CMP Latest Ref Rng & Units 05/26/2021 02/18/2021 02/08/2021  Glucose 70 - 99 mg/dL 114(H) 107(H) 108(H)  BUN 8 - 23 mg/dL 30(H) 28(H) 25(H)  Creatinine 0.61 - 1.24 mg/dL 1.23 1.33(H) 1.25(H)  Sodium 135 - 145 mmol/L 142 143 143  Potassium 3.5 - 5.1 mmol/L 4.9 4.4 4.9  Chloride 98 - 111 mmol/L 107 105 107  CO2 22 - 32 mmol/L 27 27 29   Calcium 8.9 - 10.3 mg/dL 9.1 9.5 9.8  Total Protein 6.5 - 8.1 g/dL 6.2(L) 6.4(L) 6.7  Total Bilirubin 0.3 - 1.2 mg/dL 0.8 1.0 1.4(H)  Alkaline Phos 38 - 126 U/L 57 67 69  AST 15 - 41 U/L 19 14(L) 17  ALT 0 - 44 U/L 20 16 18     RADIOGRAPHIC STUDIES: I have personally reviewed the radiological images as listed and agreed with the findings in the report: lymphadenopathy with no splenomegaly.  No results found.  ASSESSMENT & PLAN Grayson A Lucena 70 y.o. male with medical history significant for newly diagnosed CLL who presents for a follow up visit.   At this time the patient's findings are most consistent with a Rai stage I CLL.  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  At this time I recommend close observation with a repeat clinic visit in 3 months time.  If he is stable at that time we can see the patient every 6 months in order to evaluate his blood counts and clinical  findings.  # Chronic Lymphocytic Leukemia, Rai Stage I --patient is Stage I based on lymphocytosis and lymphadenopathy --no indication for treatment at this time, patient has moderate thrombocytopenia and no anemia --Plt dropped modestly below 100, currently at 90. We can continue to monitor and if it drops further we can consider initiating treatment.  --given the borderline thrombocytopenia would favor continued observation at this time.  --prognostic panel shows normal cytogenetics, intermediate risk. 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  --recommend continued observation with a return clinic visit in 3 months time.   No orders of the defined types were placed in this encounter.  All questions were answered. The patient knows to call the clinic with any problems, questions or concerns.  A total of more than 30 minutes were spent on this encounter with face-to-face time and non-face-to-face time, including preparing to see the patient, ordering tests and/or medications, counseling the patient and coordination of care as outlined above.   Ledell Peoples, MD Department of Hematology/Oncology [place] [facility] Phone: [phone] Pager: [phone] Email: [email or web address]  05/26/2021 11:22 AM

## 2021-08-03 DIAGNOSIS — C4491 Basal cell carcinoma of skin, unspecified: Secondary | ICD-10-CM | POA: Insufficient documentation

## 2021-08-03 HISTORY — DX: Basal cell carcinoma of skin, unspecified: C44.91

## 2021-08-15 NOTE — Progress Notes (Signed)
Surgery orders requested via Epic inbox. °

## 2021-08-19 ENCOUNTER — Encounter: Payer: Self-pay | Admitting: Orthopedic Surgery

## 2021-08-19 DIAGNOSIS — C439 Malignant melanoma of skin, unspecified: Secondary | ICD-10-CM | POA: Insufficient documentation

## 2021-08-19 HISTORY — DX: Malignant melanoma of skin, unspecified: C43.9

## 2021-08-22 NOTE — H&P (Cosign Needed)
TOTAL KNEE ADMISSION H&P  Patient is being admitted for right total knee arthroplasty.  Subjective:  Chief Complaint: Right knee pain.  HPI: Frederick Pham, 71 y.o. male has a history of pain and functional disability in the right knee due to arthritis and has failed non-surgical conservative treatments for greater than 12 weeks to include NSAID's and/or analgesics, flexibility and strengthening excercises, and activity modification. Onset of symptoms was gradual, starting  several  years ago with gradually worsening course since that time. The patient noted no past surgery on the right knee.  Patient currently rates pain in the right knee at 7 out of 10 with activity. Patient has worsening of pain with activity and weight bearing, pain that interferes with activities of daily living, pain with passive range of motion, and crepitus. Patient has evidence of periarticular osteophytes and joint space narrowing by imaging studies. There is no active infection.  Patient Active Problem List   Diagnosis Date Noted   Atypical mole 04/22/2021   Lymphocytosis 02/08/2021   Thrombocytopenia ([place]) 02/08/2021   Chronic pain of right knee 01/14/2021   Nocturia 01/14/2021   Onychomycosis 01/14/2021   Tremor of left hand 01/14/2021    No past medical history on file.  Past Surgical History:  Procedure Laterality Date   FRACTURE SURGERY     age 37   TONSILLECTOMY     age 69   WISDOM TOOTH EXTRACTION      Prior to Admission medications   Medication Sig Start Date End Date Taking? Authorizing Provider  Ascorbic Acid (VITAMIN C) 1000 MG tablet Take 1,000 mg by mouth daily.   Yes [provider]  B Complex-Biotin-FA (B-COMPLEX PO) Take 1 tablet by mouth daily.   Yes [provider]  Cholecalciferol (VITAMIN D3) 50 MCG (2000 UT) TABS Take 2,000 Units by mouth.   Yes [provider]  MAGNESIUM PO Take 1 tablet by mouth daily.   Yes [provider]  Omega-3 Fatty Acids  (FISH OIL) 1200 MG CAPS Take 1,200 mg by mouth daily.   Yes [provider]  vitamin E 1000 UNIT capsule Take 1,000 Units by mouth daily.   Yes [provider]  gabapentin (NEURONTIN) 100 MG capsule Take 1 capsule (100 mg total) by mouth 3 (three) times daily. Patient not taking: Reported on 08/22/2021 03/17/21   Yvonna Alanis, NP    No Known Allergies  Social History   Socioeconomic History   Marital status: Married    Spouse name: Not on file   Number of children: Not on file   Years of education: Not on file   Highest education level: Not on file  Occupational History   Not on file  Tobacco Use   Smoking status: Former    Packs/day: 1.00    Years: 10.00    Pack years: 10.00    Types: Cigarettes    Quit date: 10/08/1990    Years since quitting: 30.8   Smokeless tobacco: Never  Vaping Use   Vaping Use: Never used  Substance and Sexual Activity   Alcohol use: Yes    Alcohol/week: 1.0 - 2.0 standard drink    Types: 1 - 2 Standard drinks or equivalent per week   Drug use: Never   Sexual activity: Not on file  Other Topics Concern   Not on file  Social History Narrative   Diet:      Caffeine: Yes      Married, if yes what year: Yes, 62  Do you live in a house, apartment, assisted living, condo, trailer, ect: House      Is it one or more stories: 3      How many persons live in your home? 2      Pets: No      Highest level or education completed: 15      Current/Past profession: Chief Financial Officer, Teacher, English as a foreign language      Exercise: Yes                 Type and how often: weights- normally 5 days/week         Living Will: No   DNR: No   POA/HPOA: No      Functional Status:   Do you have difficulty bathing or dressing yourself? No   Do you have difficulty preparing food or eating? No   Do you have difficulty managing your medications? No   Do you have difficulty managing your finances? No   Do you have difficulty affording your medications? No   Social  Determinants of Radio broadcast assistant Strain: Not on file  Food Insecurity: Not on file  Transportation Needs: Not on file  Physical Activity: Not on file  Stress: Not on file  Social Connections: Not on file  Intimate Partner Violence: Not on file    Tobacco Use: Medium Risk   Smoking Tobacco Use: Former   Smokeless Tobacco Use: Never   Passive Exposure: Not on file   Social History   Substance and Sexual Activity  Alcohol Use Yes   Alcohol/week: 1.0 - 2.0 standard drink   Types: 1 - 2 Standard drinks or equivalent per week    Family History  Adopted: Yes    ROS: Constitutional: no fever, no chills, no night sweats, no significant weight loss Cardiovascular: no chest pain, no palpitations Respiratory: no cough, no shortness of breath, No COPD Gastrointestinal: no vomiting, no nausea Musculoskeletal: no swelling in Joints, Joint Pain Neurologic: no numbness, no tingling, no difficulty with balance   Objective:  Physical Exam: Well nourished and well developed.  General: Alert and oriented x3, cooperative and pleasant, no acute distress.  Head: normocephalic, atraumatic, neck supple.  Eyes: EOMI.  Respiratory: breath sounds clear in all fields, no wheezing, rales, or rhonchi. Cardiovascular: Regular rate and rhythm, no murmurs, gallops or rubs.  Abdomen: non-tender to palpation and soft, normoactive bowel sounds. Musculoskeletal:       The patient has a significantly antalgic gait pattern favoring the right side without the use of assistive devices.     Right Knee Exam:   Significant varus deformity.   No effusion present. No swelling present.   The range of motion is: 5 to 130 degrees.   Positive crepitus on range of motion of the knee.   Positive medial joint line tenderness.   No lateral joint line tenderness.   The knee is stable.       Left Knee Exam:   No effusion present. No swelling present.   The Range of motion is: 0 to 125 degrees.    No crepitus on range of motion of the knee.   No medial joint line tenderness.   No lateral joint line tenderness.   Stable knee.   Calves soft and nontender. Motor function intact in LE. Strength 5/5 LE bilaterally. Neuro: Distal pulses 2+. Sensation to light touch intact in LE.   Vital signs in last 24 hours: BP: ()/()  Arterial Line BP: ()/()   Imaging Review  Radiographs- AP and lateral of the bilateral knees dated 12/01/2020 demonstrate bone-on-bone change in the medial compartment of the right knee with patellofemoral narrowing and spurring. He has significant varus deformity and tibial subluxation. His left knee looks normal.  Assessment/Plan:  End stage arthritis, right knee   The patient history, physical examination, clinical judgment of the provider and imaging studies are consistent with end stage degenerative joint disease of the right knee and total knee arthroplasty is deemed medically necessary. The treatment options including medical management, injection therapy arthroscopy and arthroplasty were discussed at length. The risks and benefits of total knee arthroplasty were presented and reviewed. The risks due to aseptic loosening, infection, stiffness, patella tracking problems, thromboembolic complications and other imponderables were discussed. The patient acknowledged the explanation, agreed to proceed with the plan and consent was signed. Patient is being admitted for inpatient treatment for surgery, pain control, PT, OT, prophylactic antibiotics, VTE prophylaxis, progressive ambulation and ADLs and discharge planning. The patient is planning to be discharged  home .   Patient's anticipated LOS is less than 2 midnights, meeting these requirements: - Younger than 59 - Lives within 1 hour of care - Has a competent adult at home to recover with post-op - NO history of  - Chronic pain requiring opioids  - Diabetes  - Coronary Artery Disease  - Heart failure  - Heart  attack  - Stroke  - DVT/VTE  - Cardiac arrhythmia  - Respiratory Failure/COPD  - Renal failure  - Anemia  - Advanced Liver disease    Therapy Plans: [facility] in [place] Disposition: Home with Wife Planned DVT Prophylaxis: Xarelto '10mg'$  (Hx of skin cancer) DME Needed: Vassie Moselle PCP: [name], MD TXA: IV Allergies: None Anesthesia Concerns: None BMI: 34.4 Last HgbA1c: n/a  Pharmacy: [facility] on [address] in [place]  - Patient was instructed on what medications to stop prior to surgery. - Follow-up visit in 2 weeks with Dr. Wynelle Link - Begin physical therapy following surgery - Pre-operative lab work as pre-surgical testing - Prescriptions will be provided in hospital at time of discharge  Fenton Foy, [facility] PA-C Orthopedic Surgery [facility] [place]

## 2021-08-23 ENCOUNTER — Other Ambulatory Visit: Payer: Self-pay | Admitting: Physician Assistant

## 2021-08-23 DIAGNOSIS — C9110 Chronic lymphocytic leukemia of B-cell type not having achieved remission: Secondary | ICD-10-CM

## 2021-08-23 NOTE — Patient Instructions (Signed)
DUE TO COVID-19 ONLY ONE VISITOR  (aged 71 and older)  IS ALLOWED TO COME WITH YOU AND STAY IN THE WAITING ROOM ONLY DURING PRE OP AND PROCEDURE.   ?**NO VISITORS ARE ALLOWED IN THE SHORT STAY AREA OR RECOVERY ROOM!!** ? ?IF YOU WILL BE ADMITTED INTO THE HOSPITAL YOU ARE ALLOWED ONLY TWO SUPPORT PEOPLE DURING VISITATION HOURS ONLY (7 AM -8PM)   ?The support person(s) must pass our screening, gel in and out, and wear a mask at all times, including in the patient?s room. ?Patients must also wear a mask when staff or their support person are in the room. ?Visitors GUEST BADGE MUST BE WORN VISIBLY  ?One adult visitor may remain with you overnight and MUST be in the room by 8 P.M. ?  ? Your procedure is scheduled on: 09/05/21 ? ? Report to [facility] Main Entrance ? ?  Report to admitting at: 8:45 AM ? ? Call this number if you have problems the morning of surgery [phone] ? ? Do not eat food :After Midnight. ? ? After Midnight you may have the following liquids until : 8:00 AM DAY OF SURGERY ? ?Water ?Black Coffee (sugar ok, NO MILK/CREAM OR CREAMERS)  ?Tea (sugar ok, NO MILK/CREAM OR CREAMERS) regular and decaf                             ?Plain Jell-O (NO RED)                                           ?Fruit ices (not with fruit pulp, NO RED)                                     ?Popsicles (NO RED)                                                                  ?Juice: apple, WHITE grape, WHITE cranberry ?Sports drinks like Gatorade (NO RED) ?Clear broth(vegetable,chicken,beef) ? ?             ?Drink  Ensure drink AT : 8:30 AM the morning of surgery.   ?  ?The day of surgery:  ?Drink ONE (1) Pre-Surgery Clear Ensure or G2 at AM the morning of surgery. Drink in one sitting. Do not sip.  ?This drink was given to you during your hospital  ?pre-op appointment visit. ?Nothing else to drink after completing the  ?Pre-Surgery Clear Ensure or G2. ?  ?       If you have questions, please contact your surgeon?s  office.  ?  ?Oral Hygiene is also important to reduce your risk of infection.                                    ?Remember - BRUSH YOUR TEETH THE MORNING OF SURGERY WITH YOUR REGULAR TOOTHPASTE ? ? Do NOT smoke after Midnight ? ? Take these medicines the morning of surgery  with A SIP OF WATER: N/A ? ?DO NOT TAKE ANY ORAL DIABETIC MEDICATIONS DAY OF YOUR SURGERY ? ?Bring CPAP mask and tubing day of surgery. ?                  ?           You may not have any metal on your body including hair pins, jewelry, and body piercing ? ?           Do not wear lotions, powders, perfumes/cologne, or deodorant ? ?            Men may shave face and neck. ? ? Do not bring valuables to the hospital. [facility] NOT ?            RESPONSIBLE   FOR VALUABLES. ? ? Contacts, dentures or bridgework may not be worn into surgery. ? ? Bring small overnight bag day of surgery. ?  ? Patients discharged on the day of surgery will not be allowed to drive home.  Someone NEEDS to stay with you for the first 24 hours after anesthesia. ? ? Special Instructions: Bring a copy of your healthcare power of attorney and living will documents         the day of surgery if you haven't scanned them before. ? ?            Please read over the following fact sheets you were given: IF [place] [phone] ? ?   [facility] - Preparing for Surgery ?Before surgery, you can play an important role.  Because skin is not sterile, your skin needs to be as free of germs as possible.  You can reduce the number of germs on your skin by washing with CHG (chlorahexidine gluconate) soap before surgery.  CHG is an antiseptic cleaner which kills germs and bonds with the skin to continue killing germs even after washing. ?Please DO NOT use if you have an allergy to CHG or antibacterial soaps.  If your skin becomes reddened/irritated stop using the CHG and inform your nurse when you arrive at Short Stay. ?Do not shave  (including legs and underarms) for at least 48 hours prior to the first CHG shower.  You may shave your face/neck. ?Please follow these instructions carefully: ? 1.  Shower with CHG Soap the night before surgery and the  morning of Surgery. ? 2.  If you choose to wash your hair, wash your hair first as usual with your  normal  shampoo. ? 3.  After you shampoo, rinse your hair and body thoroughly to remove the  shampoo.                           4.  Use CHG as you would any other liquid soap.  You can apply chg directly  to the skin and wash  ?                     Gently with a scrungie or clean washcloth. ? 5.  Apply the CHG Soap to your body ONLY FROM THE NECK DOWN.   Do not use on face/ open      ?                     Wound or open sores. Avoid contact with eyes, ears mouth and genitals (private parts).  ?  Wash face,  Genitals (private parts) with your normal soap. ?            6.  Wash thoroughly, paying special attention to the area where your surgery  will be performed. ? 7.  Thoroughly rinse your body with warm water from the neck down. ? 8.  DO NOT shower/wash with your normal soap after using and rinsing off  the CHG Soap. ?               9.  Pat yourself dry with a clean towel. ?           10.  Wear clean pajamas. ?           11.  Place clean sheets on your bed the night of your first shower and do not  sleep with pets. ?Day of Surgery : ?Do not apply any lotions/deodorants the morning of surgery.  Please wear clean clothes to the hospital/surgery center. ? ?FAILURE TO FOLLOW THESE INSTRUCTIONS MAY RESULT IN THE CANCELLATION OF YOUR SURGERY ?PATIENT SIGNATURE_________________________________ ? ?NURSE SIGNATURE__________________________________ ? ?________________________________________________________________________  ? ?Incentive Spirometer ? ?An incentive spirometer is a tool that can help keep your lungs clear and active. This tool measures how well you are filling your lungs with  each breath. Taking long deep breaths may help reverse or decrease the chance of developing breathing (pulmonary) problems (especially infection) following: ?A long period of time when you are unable to move or be active. ?BEFORE THE PROCEDURE  ?If the spirometer includes an indicator to show your best effort, your nurse or respiratory therapist will set it to a desired goal. ?If possible, sit up straight or lean slightly forward. Try not to slouch. ?Hold the incentive spirometer in an upright position. ?INSTRUCTIONS FOR USE  ?Sit on the edge of your bed if possible, or sit up as far as you can in bed or on a chair. ?Hold the incentive spirometer in an upright position. ?Breathe out normally. ?Place the mouthpiece in your mouth and seal your lips tightly around it. ?Breathe in slowly and as deeply as possible, raising the piston or the ball toward the top of the column. ?Hold your breath for 3-5 seconds or for as long as possible. Allow the piston or ball to fall to the bottom of the column. ?Remove the mouthpiece from your mouth and breathe out normally. ?Rest for a few seconds and repeat Steps 1 through 7 at least 10 times every 1-2 hours when you are awake. Take your time and take a few normal breaths between deep breaths. ?The spirometer may include an indicator to show your best effort. Use the indicator as a goal to work toward during each repetition. ?After each set of 10 deep breaths, practice coughing to be sure your lungs are clear. If you have an incision (the cut made at the time of surgery), support your incision when coughing by placing a pillow or rolled up towels firmly against it. ?Once you are able to get out of bed, walk around indoors and cough well. You may stop using the incentive spirometer when instructed by your caregiver.  ?RISKS AND COMPLICATIONS ?Take your time so you do not get dizzy or light-headed. ?If you are in pain, you may need to take or ask for pain medication before doing  incentive spirometry. It is harder to take a deep breath if you are having pain. ?AFTER USE ?Rest and breathe slowly and easily. ?It can be helpful to keep track  of a log of your progress. Your caregiver can provid

## 2021-08-24 ENCOUNTER — Telehealth: Payer: Self-pay

## 2021-08-24 ENCOUNTER — Inpatient Hospital Stay: Payer: Medicare Other | Attending: Hematology and Oncology | Admitting: Physician Assistant

## 2021-08-24 ENCOUNTER — Encounter (HOSPITAL_COMMUNITY)
Admission: RE | Admit: 2021-08-24 | Discharge: 2021-08-24 | Disposition: A | Discharge status: Home / Self Care | Payer: Medicare Other | Source: Ambulatory Visit | Attending: Orthopedic Surgery | Admitting: Orthopedic Surgery

## 2021-08-24 ENCOUNTER — Other Ambulatory Visit: Payer: Self-pay

## 2021-08-24 ENCOUNTER — Inpatient Hospital Stay: Payer: Medicare Other

## 2021-08-24 ENCOUNTER — Encounter (HOSPITAL_COMMUNITY): Payer: Self-pay

## 2021-08-24 VITALS — BP 128/66 | HR 71 | Temp 97.5°F | Resp 16 | Wt 140.3 lb

## 2021-08-24 VITALS — BP 133/69 | HR 76 | Temp 97.8°F | Ht 65.0 in | Wt 139.0 lb

## 2021-08-24 DIAGNOSIS — K5900 Constipation, unspecified: Secondary | ICD-10-CM | POA: Insufficient documentation

## 2021-08-24 DIAGNOSIS — C9110 Chronic lymphocytic leukemia of B-cell type not having achieved remission: Secondary | ICD-10-CM | POA: Diagnosis present

## 2021-08-24 DIAGNOSIS — Z87891 Personal history of nicotine dependence: Secondary | ICD-10-CM | POA: Insufficient documentation

## 2021-08-24 DIAGNOSIS — Z01812 Encounter for preprocedural laboratory examination: Secondary | ICD-10-CM | POA: Insufficient documentation

## 2021-08-24 DIAGNOSIS — Z01818 Encounter for other preprocedural examination: Secondary | ICD-10-CM

## 2021-08-24 HISTORY — DX: Chronic lymphocytic leukemia of B-cell type not having achieved remission: C91.10

## 2021-08-24 HISTORY — DX: Unspecified osteoarthritis, unspecified site: M19.90

## 2021-08-24 HISTORY — DX: Malignant (primary) neoplasm, unspecified: C80.1

## 2021-08-24 LAB — CBC WITH DIFFERENTIAL (CANCER CENTER ONLY)
Abs Immature Granulocytes: 0.05 10*3/uL (ref 0.00–0.07)
Basophils Absolute: 0 10*3/uL (ref 0.0–0.1)
Basophils Relative: 0 %
Eosinophils Absolute: 0.1 10*3/uL (ref 0.0–0.5)
Eosinophils Relative: 0 %
HCT: 41.6 % (ref 39.0–52.0)
Hemoglobin: 13.6 g/dL (ref 13.0–17.0)
Immature Granulocytes: 0 %
Lymphocytes Relative: 95 %
Lymphs Abs: 57.5 10*3/uL — ABNORMAL HIGH (ref 0.7–4.0)
MCH: 30.9 pg (ref 26.0–34.0)
MCHC: 32.7 g/dL (ref 30.0–36.0)
MCV: 94.5 fL (ref 80.0–100.0)
Monocytes Absolute: 0.3 10*3/uL (ref 0.1–1.0)
Monocytes Relative: 0 %
Neutro Abs: 2.9 10*3/uL (ref 1.7–7.7)
Neutrophils Relative %: 5 %
Platelet Count: 108 10*3/uL — ABNORMAL LOW (ref 150–400)
RBC: 4.4 MIL/uL (ref 4.22–5.81)
RDW: 13.6 % (ref 11.5–15.5)
Smear Review: NORMAL
WBC Count: 60.8 10*3/uL (ref 4.0–10.5)
nRBC: 0 % (ref 0.0–0.2)

## 2021-08-24 LAB — CMP (CANCER CENTER ONLY)
ALT: 16 U/L (ref 0–44)
AST: 16 U/L (ref 15–41)
Albumin: 4.4 g/dL (ref 3.5–5.0)
Alkaline Phosphatase: 69 U/L (ref 38–126)
Anion gap: 4 — ABNORMAL LOW (ref 5–15)
BUN: 30 mg/dL — ABNORMAL HIGH (ref 8–23)
CO2: 32 mmol/L (ref 22–32)
Calcium: 9.9 mg/dL (ref 8.9–10.3)
Chloride: 105 mmol/L (ref 98–111)
Creatinine: 1.34 mg/dL — ABNORMAL HIGH (ref 0.61–1.24)
GFR, Estimated: 57 mL/min — ABNORMAL LOW (ref >60)
Glucose, Bld: 112 mg/dL — ABNORMAL HIGH (ref 70–99)
Potassium: 4.3 mmol/L (ref 3.5–5.1)
Sodium: 141 mmol/L (ref 135–145)
Total Bilirubin: 1.3 mg/dL — ABNORMAL HIGH (ref 0.3–1.2)
Total Protein: 6.7 g/dL (ref 6.5–8.1)

## 2021-08-24 LAB — SURGICAL PCR SCREEN
MRSA, PCR: NEGATIVE
Staphylococcus aureus: NEGATIVE

## 2021-08-24 LAB — LACTATE DEHYDROGENASE: LDH: 101 U/L (ref 98–192)

## 2021-08-24 NOTE — Progress Notes (Signed)
?[place] ?[phone]   [phone] ? ?PROGRESS NOTE ? ?Patient Care Team: ?Yvonna Alanis, NP as PCP - General (Adult Health Nurse Practitioner) ?[name], DPM as Consulting Physician (Podiatry) ?Gaynelle Arabian, MD as Consulting Physician (Orthopedic Surgery) ? ?Hematological/Oncological History ?# Chronic Lymphocytic Leukemia, Rai Stage I ?1) 01/21/2021: WBC 39.8 (H), Hgb 14.2, Plt 131 (L) ? 2) 02/08/2021: Establish care with Dede Query PA-C. WBC: 46.7, Hgb 14.1, Plt 113K. ALC 45,900. Flow cytometry confirms a monoclonal B cell population consistent with CLL. ? 3) 02/18/2021: CLL prognostic panel results show normal cytogenetics, intermediate risk. IgVH positive. CT C/A/P showed enlarged retroperitoneal, portacaval, bilateral iliac, and pelvic sidewall lymph nodes, no evidence of splenomegaly.  ?05/26/2021: WBC 48.8, Hgb 13.3, MCV 95, Plt 90 ?08/24/2021: WBC 60.8, Hgb 13.6, MCV 94.5, Plt 108 ? ?Interval History:  ?Frederick Pham 71 y.o. male with medical history significant for newly diagnosed CLL who presents for a follow up visit. The patient's last visit was on 05/26/2021. In the interim since the last visit he has had no major changes in his health.  ? ?On exam today Frederick Pham is accompanied by his wife.  He reports that his energy levels are fairly stable.  He is not exercising and lifting weights as before so has lost some muscle mass.  He continues to have knee pain and is preparing for his upcoming knee surgery scheduled for 09/05/2021. He denies any changes to his appetite.  He denies any nausea, vomiting or abdominal pain.  He has mild constipation that resolves on its own.  He reports some bruising but no signs of active bleeding.  He denies fevers, chills, night sweats, shortness of breath, chest pain, palpable lymph nodes or abdominal fullness.  He has no other complaints.  The 10 point ROS is below. ? ?MEDICAL HISTORY:  ?Past Medical History:  ?Diagnosis  Date  ? Arthritis   ? Cancer [facility])   ? CLL (chronic lymphocytic leukemia) ([place])   ? ? ?SURGICAL HISTORY: ?Past Surgical History:  ?Procedure Laterality Date  ? FRACTURE SURGERY    ? age 80  ? TONSILLECTOMY    ? age 25  ? WISDOM TOOTH EXTRACTION    ? ? ?SOCIAL HISTORY: ?Social History  ? ?Socioeconomic History  ? Marital status: Married  ?  Spouse name: Not on file  ? Number of children: Not on file  ? Years of education: Not on file  ? Highest education level: Not on file  ?Occupational History  ? Not on file  ?Tobacco Use  ? Smoking status: Former  ?  Packs/day: 1.00  ?  Years: 10.00  ?  Pack years: 10.00  ?  Types: Cigarettes  ?  Quit date: 10/08/1990  ?  Years since quitting: 30.8  ? Smokeless tobacco: Never  ?Vaping Use  ? Vaping Use: Never used  ?Substance and Sexual Activity  ? Alcohol use: Yes  ?  Alcohol/week: 1.0 - 2.0 standard drink  ?  Types: 1 - 2 Standard drinks or equivalent per week  ?  Comment: occas.  ? Drug use: Never  ? Sexual activity: Not on file  ?Other Topics Concern  ? Not on file  ?Social History Narrative  ? Diet:  ?   ? Caffeine: Yes  ?   ? Married, if yes what year: Yes, 1977  ?   ? Do you live in a house, apartment, assisted living, condo, trailer, ect: House  ?   ?  Is it one or more stories: 3  ?   ? How many persons live in your home? 2  ?   ? Pets: No  ?   ? Highest level or education completed: 15  ?   ? Current/Past profession: Chief Financial Officer, CEO  ?   ? Exercise: Yes                 Type and how often: weights- normally 5 days/week  ?   ?   ? Living Will: No  ? DNR: No  ? POA/HPOA: No  ?   ? Functional Status:  ? Do you have difficulty bathing or dressing yourself? No  ? Do you have difficulty preparing food or eating? No  ? Do you have difficulty managing your medications? No  ? Do you have difficulty managing your finances? No  ? Do you have difficulty affording your medications? No  ? ?Social Determinants of Health  ? ?Financial Resource Strain: Not on file  ?Food Insecurity: Not on file   ?Transportation Needs: Not on file  ?Physical Activity: Not on file  ?Stress: Not on file  ?Social Connections: Not on file  ?Intimate Partner Violence: Not on file  ? ? ?FAMILY HISTORY: ?Family History  ?Adopted: Yes  ? ? ?ALLERGIES:  has No Known Allergies. ? ?MEDICATIONS:  ?Current Outpatient Medications  ?Medication Sig Dispense Refill  ? Ascorbic Acid (VITAMIN C) 1000 MG tablet Take 1,000 mg by mouth daily.    ? B Complex-Biotin-FA (B-COMPLEX PO) Take 1 tablet by mouth daily.    ? Cholecalciferol (VITAMIN D3) 50 MCG (2000 UT) TABS Take 2,000 Units by mouth.    ? MAGNESIUM PO Take 1 tablet by mouth daily.    ? Omega-3 Fatty Acids (FISH OIL) 1200 MG CAPS Take 1,200 mg by mouth daily.    ? vitamin E 1000 UNIT capsule Take 1,000 Units by mouth daily.    ? ?No current facility-administered medications for this visit.  ? ? ?REVIEW OF SYSTEMS:   ?Constitutional: ( - ) fevers, ( - )  chills , ( - ) night sweats ?Eyes: ( - ) blurriness of vision, ( - ) double vision, ( - ) watery eyes ?Ears, nose, mouth, throat, and face: ( - ) mucositis, ( - ) sore throat ?Respiratory: ( - ) cough, ( - ) dyspnea, ( - ) wheezes ?Cardiovascular: ( - ) palpitation, ( - ) chest discomfort, ( - ) lower extremity swelling ?Gastrointestinal:  ( - ) nausea, ( - ) heartburn, ( - ) change in bowel habits ?Skin: ( - ) abnormal skin rashes ?Lymphatics: ( - ) new lymphadenopathy, ( - ) easy bruising ?Neurological: ( - ) numbness, ( - ) tingling, ( - ) new weaknesses ?Behavioral/Psych: ( - ) mood change, ( - ) new changes  ?All other systems were reviewed with the patient and are negative. ? ?PHYSICAL EXAMINATION: ?ECOG PERFORMANCE STATUS: 0 - Asymptomatic ? ?Vitals:  ? 08/24/21 1040  ?BP: 128/66  ?Pulse: 71  ?Resp: 16  ?Temp: (!) 97.5 ?F (36.4 ?C)  ?SpO2: 100%  ? ? ?Filed Weights  ? 08/24/21 1040  ?Weight: 140 lb 4.8 oz (63.6 kg)  ? ? ? ?GENERAL: Well-appearing elderly Caucasian male, alert, no distress and comfortable ?SKIN: skin color, texture,  turgor are normal, no rashes or significant lesions ?EYES: conjunctiva are pink and non-injected, sclera clear ?NECK: supple, non-tender ?LYMPH:  no palpable lymphadenopathy in the cervical, axillary or inguinal ?LUNGS: clear to auscultation  and percussion with normal breathing effort ?HEART: regular rate & rhythm and no murmurs and no lower extremity edema ?Musculoskeletal: no cyanosis of digits and no clubbing  ?PSYCH: alert & oriented x 3, fluent speech ?NEURO: no focal motor/sensory deficits ? ?LABORATORY DATA:  ?I have reviewed the data as listed ?CBC Latest Ref Rng & Units 08/24/2021 05/26/2021 02/18/2021  ?WBC 4.0 - 10.5 K/uL 60.8(HH) 48.8(H) 49.7(H)  ?Hemoglobin 13.0 - 17.0 g/dL 13.6 13.3 14.0  ?Hematocrit 39.0 - 52.0 % 41.6 39.8 41.4  ?Platelets 150 - 400 K/uL 108(L) 90(L) 139(L)  ? ? ?CMP Latest Ref Rng & Units 08/24/2021 05/26/2021 02/18/2021  ?Glucose 70 - 99 mg/dL 112(H) 114(H) 107(H)  ?BUN 8 - 23 mg/dL 30(H) 30(H) 28(H)  ?Creatinine 0.61 - 1.24 mg/dL 1.34(H) 1.23 1.33(H)  ?Sodium 135 - 145 mmol/L 141 142 143  ?Potassium 3.5 - 5.1 mmol/L 4.3 4.9 4.4  ?Chloride 98 - 111 mmol/L 105 107 105  ?CO2 22 - 32 mmol/L 32 27 27  ?Calcium 8.9 - 10.3 mg/dL 9.9 9.1 9.5  ?Total Protein 6.5 - 8.1 g/dL 6.7 6.2(L) 6.4(L)  ?Total Bilirubin 0.3 - 1.2 mg/dL 1.3(H) 0.8 1.0  ?Alkaline Phos 38 - 126 U/L 69 57 67  ?AST 15 - 41 U/L 16 19 14(L)  ?ALT 0 - 44 U/L '16 20 16  '$ ? ? ?RADIOGRAPHIC STUDIES: ?I have personally reviewed the radiological images as listed and agreed with the findings in the report: lymphadenopathy with no splenomegaly.  ?No results found. ? ?ASSESSMENT & PLAN ?Carliss A Mastrianni 71 y.o. male with medical history significant for newly diagnosed CLL who presents for a follow up visit.  ? ?At this time the patient's findings are most consistent with a Rai stage I CLL.  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   ? ?#  Chronic Lymphocytic Leukemia, Rai Stage I ?--patient is Stage I based on lymphocytosis and lymphadenopathy ?--prognostic panel shows normal cytogenetics, intermediate risk. 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

## 2021-08-24 NOTE — Progress Notes (Signed)
COVID Vaccine: Pfizer x 4: 2022 ?Bowel prep reminder: N/A ? ?PCP - Yvonna Alanis: NP ?Cardiologist -  ? ?Chest x-ray - CT chest: 02/21/21 ?EKG - 03/17/21: EPIC ?Stress Test -  ?ECHO -  ?Cardiac Cath -  ?Pacemaker/ICD device last checked: ? ?Sleep Study -  ?CPAP -  ? ?Fasting Blood Sugar -  ?Checks Blood Sugar _____ times a day ? ?Blood Thinner Instructions: ?Aspirin Instructions: ?Last Dose: ? ?Anesthesia review: Hx: CLL ? ?Patient denies shortness of breath, fever, cough and chest pain at PAT appointment ? ? ?Patient verbalized understanding of instructions that were given to them at the PAT appointment. Patient was also instructed that they will need to review over the PAT instructions again at home before surgery.  ?

## 2021-08-24 NOTE — Telephone Encounter (Signed)
CRITICAL VALUE STICKER ? ?CRITICAL VALUE:  WBC 60.8 ? ?RECEIVER (on-site recipient of call): [employer] ? ?DATE & TIME NOTIFIED: 10:15 ? ?MESSENGER (representative from lab):  Heather ? ?MD NOTIFIED:   Dede Query, PA-C ? ?TIME OF NOTIFICATION:  10:17 ? ?RESPONSE:  ? ?

## 2021-08-25 ENCOUNTER — Ambulatory Visit (INDEPENDENT_AMBULATORY_CARE_PROVIDER_SITE_OTHER): Payer: Medicare Other | Admitting: Podiatry

## 2021-08-25 DIAGNOSIS — B351 Tinea unguium: Secondary | ICD-10-CM

## 2021-08-25 DIAGNOSIS — M79675 Pain in left toe(s): Secondary | ICD-10-CM

## 2021-08-25 DIAGNOSIS — M79674 Pain in right toe(s): Secondary | ICD-10-CM

## 2021-08-30 ENCOUNTER — Encounter: Payer: Self-pay | Admitting: Podiatry

## 2021-08-30 NOTE — Progress Notes (Signed)
?  Subjective:  ?Patient ID: Frederick Pham, male    DOB: 03-03-1951,  MRN: [id number] ? ?Chief Complaint  ?Patient presents with  ? Nail Problem  ?  Nail trim   ? ? ?71 y.o. male presents with the above complaint. History confirmed with patient.  He returns today for follow-up he did not start the Lamisil as he has not had his orthopedic surgery yet which has been rescheduled to the next few weeks ? ?Objective:  ?Physical Exam: ?warm, good capillary refill, no trophic changes or ulcerative lesions, normal DP and PT pulses, and normal sensory exam. ?Left Foot: dystrophic yellowed discolored nail plates with subungual debris ?Right Foot: dystrophic yellowed discolored nail plates with subungual debris ? ?LFTs normal ?Assessment:  ? ?1. Pain due to onychomycosis of toenails of both feet   ? ? ? ?Plan:  ?Patient was evaluated and treated and all questions answered. ? ? ? ?Discussed the etiology and treatment options for the condition in detail with the patient. Educated patient on the topical and oral treatment options for mycotic nails. Recommended debridement of the nails today. Sharp and mechanical debridement performed of all painful and mycotic nails today. Nails debrided in length and thickness using a nail nipper to level of comfort. Discussed treatment options including appropriate shoe gear. Follow up as needed for painful nails. ? ?We can discuss treatment for the onychomycosis and decided that laser treatment may be something that they would be interested in.  We discussed the pros cons and effectiveness of laser treatment.  He will be scheduled for this soon as his knee is rehabilitated. ? ?Return in about 6 months (around 02/25/2022) for follow up after nail fungus laser treatment.  ? ? ?

## 2021-09-05 ENCOUNTER — Other Ambulatory Visit: Payer: Self-pay

## 2021-09-05 ENCOUNTER — Encounter (HOSPITAL_COMMUNITY)
Admission: RE | Disposition: A | Discharge status: Home / Self Care | Payer: Self-pay | Source: Ambulatory Visit | Attending: Orthopedic Surgery

## 2021-09-05 ENCOUNTER — Ambulatory Visit (HOSPITAL_COMMUNITY): Payer: Medicare Other | Admitting: Physician Assistant

## 2021-09-05 ENCOUNTER — Ambulatory Visit (HOSPITAL_BASED_OUTPATIENT_CLINIC_OR_DEPARTMENT_OTHER): Payer: Medicare Other | Admitting: Physician Assistant

## 2021-09-05 ENCOUNTER — Encounter (HOSPITAL_COMMUNITY): Payer: Self-pay | Admitting: Orthopedic Surgery

## 2021-09-05 ENCOUNTER — Observation Stay (HOSPITAL_COMMUNITY)
Admission: RE | Admit: 2021-09-05 | Discharge: 2021-09-06 | Disposition: A | Discharge status: Home / Self Care | Payer: Medicare Other | Source: Ambulatory Visit | Attending: Orthopedic Surgery | Admitting: Orthopedic Surgery

## 2021-09-05 DIAGNOSIS — Z87891 Personal history of nicotine dependence: Secondary | ICD-10-CM | POA: Diagnosis not present

## 2021-09-05 DIAGNOSIS — M179 Osteoarthritis of knee, unspecified: Secondary | ICD-10-CM | POA: Diagnosis present

## 2021-09-05 DIAGNOSIS — M1711 Unilateral primary osteoarthritis, right knee: Principal | ICD-10-CM | POA: Diagnosis present

## 2021-09-05 DIAGNOSIS — C9110 Chronic lymphocytic leukemia of B-cell type not having achieved remission: Secondary | ICD-10-CM

## 2021-09-05 HISTORY — PX: TOTAL KNEE ARTHROPLASTY: SHX125

## 2021-09-05 HISTORY — DX: Unilateral primary osteoarthritis, right knee: M17.11

## 2021-09-05 LAB — CBC
HCT: 40 % (ref 39.0–52.0)
Hemoglobin: 13.4 g/dL (ref 13.0–17.0)
MCH: 32.4 pg (ref 26.0–34.0)
MCHC: 33.5 g/dL (ref 30.0–36.0)
MCV: 96.6 fL (ref 80.0–100.0)
Platelets: 134 10*3/uL — ABNORMAL LOW (ref 150–400)
RBC: 4.14 MIL/uL — ABNORMAL LOW (ref 4.22–5.81)
RDW: 14 % (ref 11.5–15.5)
WBC: 56 10*3/uL (ref 4.0–10.5)
nRBC: 0 % (ref 0.0–0.2)

## 2021-09-05 SURGERY — ARTHROPLASTY, KNEE, TOTAL
Anesthesia: Spinal | Site: Knee | Laterality: Right

## 2021-09-05 MED ORDER — ACETAMINOPHEN 500 MG PO TABS
1000.0000 mg | ORAL_TABLET | Freq: Four times a day (QID) | ORAL | Status: DC
  Administered 2021-09-05 – 2021-09-06 (×3): 1000 mg via ORAL
  Filled 2021-09-05 (×3): qty 2

## 2021-09-05 MED ORDER — RIVAROXABAN 10 MG PO TABS
10.0000 mg | ORAL_TABLET | Freq: Every day | ORAL | Status: DC
  Administered 2021-09-06: 10 mg via ORAL
  Filled 2021-09-05: qty 1

## 2021-09-05 MED ORDER — BISACODYL 10 MG RE SUPP: 10.0000 mg | Freq: Every day | RECTAL | Status: DC | PRN

## 2021-09-05 MED ORDER — CEFAZOLIN SODIUM-DEXTROSE 2-4 GM/100ML-% IV SOLN
2.0000 g | INTRAVENOUS | Status: AC
  Administered 2021-09-05: 2 g via INTRAVENOUS
  Filled 2021-09-05: qty 100

## 2021-09-05 MED ORDER — FENTANYL CITRATE PF 50 MCG/ML IJ SOSY
50.0000 ug | PREFILLED_SYRINGE | INTRAMUSCULAR | Status: DC
  Administered 2021-09-05: 50 ug via INTRAVENOUS
  Filled 2021-09-05: qty 2

## 2021-09-05 MED ORDER — PHENOL 1.4 % MT LIQD: 1.0000 | OROMUCOSAL | Status: DC | PRN

## 2021-09-05 MED ORDER — FLEET ENEMA 7-19 GM/118ML RE ENEM: 1.0000 | ENEMA | Freq: Once | RECTAL | Status: DC | PRN

## 2021-09-05 MED ORDER — DEXAMETHASONE SODIUM PHOSPHATE 10 MG/ML IJ SOLN
10.0000 mg | Freq: Once | INTRAMUSCULAR | Status: AC
  Administered 2021-09-06: 10 mg via INTRAVENOUS
  Filled 2021-09-05: qty 1

## 2021-09-05 MED ORDER — FENTANYL CITRATE PF 50 MCG/ML IJ SOSY: 25.0000 ug | PREFILLED_SYRINGE | INTRAMUSCULAR | Status: DC | PRN

## 2021-09-05 MED ORDER — ONDANSETRON HCL 4 MG/2ML IJ SOLN
INTRAMUSCULAR | Status: AC
  Filled 2021-09-05: qty 2

## 2021-09-05 MED ORDER — PROPOFOL 500 MG/50ML IV EMUL
INTRAVENOUS | Status: DC | PRN
  Administered 2021-09-05: 100 ug/kg/min via INTRAVENOUS

## 2021-09-05 MED ORDER — DEXAMETHASONE SODIUM PHOSPHATE 10 MG/ML IJ SOLN
INTRAMUSCULAR | Status: DC | PRN
  Administered 2021-09-05: 5 mg

## 2021-09-05 MED ORDER — PROPOFOL 10 MG/ML IV BOLUS
INTRAVENOUS | Status: AC
  Filled 2021-09-05: qty 20

## 2021-09-05 MED ORDER — FENTANYL CITRATE (PF) 100 MCG/2ML IJ SOLN
INTRAMUSCULAR | Status: AC
  Filled 2021-09-05: qty 2

## 2021-09-05 MED ORDER — SODIUM CHLORIDE 0.9 % IR SOLN
Status: DC | PRN
  Administered 2021-09-05: 1000 mL

## 2021-09-05 MED ORDER — ONDANSETRON HCL 4 MG/2ML IJ SOLN
INTRAMUSCULAR | Status: DC | PRN
  Administered 2021-09-05: 4 mg via INTRAVENOUS

## 2021-09-05 MED ORDER — EPHEDRINE SULFATE-NACL 50-0.9 MG/10ML-% IV SOSY
PREFILLED_SYRINGE | INTRAVENOUS | Status: DC | PRN
  Administered 2021-09-05 (×2): 5 mg via INTRAVENOUS
  Administered 2021-09-05: 10 mg via INTRAVENOUS

## 2021-09-05 MED ORDER — POVIDONE-IODINE 10 % EX SWAB
2.0000 "application " | Freq: Once | CUTANEOUS | Status: AC
  Administered 2021-09-05: 2 via TOPICAL

## 2021-09-05 MED ORDER — AMISULPRIDE (ANTIEMETIC) 5 MG/2ML IV SOLN: 10.0000 mg | Freq: Once | INTRAVENOUS | Status: DC | PRN

## 2021-09-05 MED ORDER — DOCUSATE SODIUM 100 MG PO CAPS
100.0000 mg | ORAL_CAPSULE | Freq: Two times a day (BID) | ORAL | Status: DC
  Administered 2021-09-05 – 2021-09-06 (×2): 100 mg via ORAL
  Filled 2021-09-05 (×2): qty 1

## 2021-09-05 MED ORDER — MENTHOL 3 MG MT LOZG: 1.0000 | LOZENGE | OROMUCOSAL | Status: DC | PRN

## 2021-09-05 MED ORDER — MORPHINE SULFATE (PF) 2 MG/ML IV SOLN: 0.5000 mg | INTRAVENOUS | Status: DC | PRN

## 2021-09-05 MED ORDER — METHOCARBAMOL 500 MG PO TABS
500.0000 mg | ORAL_TABLET | Freq: Four times a day (QID) | ORAL | Status: DC | PRN
  Administered 2021-09-05 – 2021-09-06 (×2): 500 mg via ORAL
  Filled 2021-09-05 (×2): qty 1

## 2021-09-05 MED ORDER — PHENYLEPHRINE HCL (PRESSORS) 10 MG/ML IV SOLN
INTRAVENOUS | Status: AC
  Filled 2021-09-05: qty 1

## 2021-09-05 MED ORDER — CEFAZOLIN SODIUM-DEXTROSE 2-4 GM/100ML-% IV SOLN
2.0000 g | Freq: Four times a day (QID) | INTRAVENOUS | Status: AC
  Administered 2021-09-05 – 2021-09-06 (×2): 2 g via INTRAVENOUS
  Filled 2021-09-05 (×2): qty 100

## 2021-09-05 MED ORDER — ACETAMINOPHEN 10 MG/ML IV SOLN
1000.0000 mg | Freq: Four times a day (QID) | INTRAVENOUS | Status: DC
  Administered 2021-09-05: 1000 mg via INTRAVENOUS
  Filled 2021-09-05: qty 100

## 2021-09-05 MED ORDER — GABAPENTIN 300 MG PO CAPS
300.0000 mg | ORAL_CAPSULE | Freq: Three times a day (TID) | ORAL | Status: DC
Start: 2021-09-05 — End: 2021-09-06
  Administered 2021-09-05 – 2021-09-06 (×2): 300 mg via ORAL
  Filled 2021-09-05 (×2): qty 1

## 2021-09-05 MED ORDER — POLYETHYLENE GLYCOL 3350 17 G PO PACK: 17.0000 g | PACK | Freq: Every day | ORAL | Status: DC | PRN

## 2021-09-05 MED ORDER — SODIUM CHLORIDE (PF) 0.9 % IJ SOLN
INTRAMUSCULAR | Status: DC | PRN
  Administered 2021-09-05: 60 mL

## 2021-09-05 MED ORDER — PROPOFOL 10 MG/ML IV BOLUS
INTRAVENOUS | Status: DC | PRN
  Administered 2021-09-05: 30 mg via INTRAVENOUS

## 2021-09-05 MED ORDER — OXYCODONE HCL 5 MG PO TABS
5.0000 mg | ORAL_TABLET | ORAL | Status: DC | PRN
  Administered 2021-09-05: 5 mg via ORAL
  Administered 2021-09-06: 10 mg via ORAL
  Administered 2021-09-06 (×2): 5 mg via ORAL
  Filled 2021-09-05: qty 2
  Filled 2021-09-05 (×4): qty 1

## 2021-09-05 MED ORDER — PHENYLEPHRINE HCL-NACL 20-0.9 MG/250ML-% IV SOLN
INTRAVENOUS | Status: DC | PRN
  Administered 2021-09-05: 25 ug/min via INTRAVENOUS

## 2021-09-05 MED ORDER — SODIUM CHLORIDE 0.9 % IV SOLN: INTRAVENOUS | Status: DC

## 2021-09-05 MED ORDER — ORAL CARE MOUTH RINSE: 15.0000 mL | Freq: Once | OROMUCOSAL | Status: AC

## 2021-09-05 MED ORDER — ONDANSETRON HCL 4 MG PO TABS: 4.0000 mg | ORAL_TABLET | Freq: Four times a day (QID) | ORAL | Status: DC | PRN

## 2021-09-05 MED ORDER — DEXAMETHASONE SODIUM PHOSPHATE 10 MG/ML IJ SOLN
INTRAMUSCULAR | Status: AC
  Filled 2021-09-05: qty 1

## 2021-09-05 MED ORDER — MIDAZOLAM HCL 2 MG/2ML IJ SOLN
INTRAMUSCULAR | Status: AC
  Filled 2021-09-05: qty 2

## 2021-09-05 MED ORDER — DIPHENHYDRAMINE HCL 12.5 MG/5ML PO ELIX: 12.5000 mg | ORAL_SOLUTION | ORAL | Status: DC | PRN

## 2021-09-05 MED ORDER — BUPIVACAINE LIPOSOME 1.3 % IJ SUSP: 20.0000 mL | Freq: Once | INTRAMUSCULAR | Status: DC

## 2021-09-05 MED ORDER — METOCLOPRAMIDE HCL 5 MG PO TABS: 5.0000 mg | ORAL_TABLET | Freq: Three times a day (TID) | ORAL | Status: DC | PRN

## 2021-09-05 MED ORDER — CHLORHEXIDINE GLUCONATE 0.12 % MT SOLN
15.0000 mL | Freq: Once | OROMUCOSAL | Status: AC
  Administered 2021-09-05: 15 mL via OROMUCOSAL

## 2021-09-05 MED ORDER — METHOCARBAMOL 500 MG IVPB - SIMPLE MED
500.0000 mg | Freq: Four times a day (QID) | INTRAVENOUS | Status: DC | PRN
  Filled 2021-09-05: qty 50

## 2021-09-05 MED ORDER — DEXAMETHASONE SODIUM PHOSPHATE 10 MG/ML IJ SOLN
8.0000 mg | Freq: Once | INTRAMUSCULAR | Status: AC
  Administered 2021-09-05: 10 mg via INTRAVENOUS

## 2021-09-05 MED ORDER — TRAMADOL HCL 50 MG PO TABS: 50.0000 mg | ORAL_TABLET | Freq: Four times a day (QID) | ORAL | Status: DC | PRN

## 2021-09-05 MED ORDER — METOCLOPRAMIDE HCL 5 MG/ML IJ SOLN: 5.0000 mg | Freq: Three times a day (TID) | INTRAMUSCULAR | Status: DC | PRN

## 2021-09-05 MED ORDER — BUPIVACAINE LIPOSOME 1.3 % IJ SUSP
INTRAMUSCULAR | Status: DC | PRN
  Administered 2021-09-05: 20 mL

## 2021-09-05 MED ORDER — LACTATED RINGERS IV SOLN: INTRAVENOUS | Status: DC

## 2021-09-05 MED ORDER — ONDANSETRON HCL 4 MG/2ML IJ SOLN: 4.0000 mg | Freq: Four times a day (QID) | INTRAMUSCULAR | Status: DC | PRN

## 2021-09-05 MED ORDER — MIDAZOLAM HCL 2 MG/2ML IJ SOLN
1.0000 mg | INTRAMUSCULAR | Status: DC
  Administered 2021-09-05: 1 mg via INTRAVENOUS
  Filled 2021-09-05: qty 2

## 2021-09-05 MED ORDER — ROPIVACAINE HCL 7.5 MG/ML IJ SOLN
INTRAMUSCULAR | Status: DC | PRN
  Administered 2021-09-05: 20 mL via PERINEURAL

## 2021-09-05 MED ORDER — TRANEXAMIC ACID-NACL 1000-0.7 MG/100ML-% IV SOLN
1000.0000 mg | INTRAVENOUS | Status: AC
  Administered 2021-09-05: 1000 mg via INTRAVENOUS
  Filled 2021-09-05: qty 100

## 2021-09-05 SURGICAL SUPPLY — 50 items
ATTUNE PS FEM RT SZ 7 CEM KNEE (Femur) ×1 IMPLANT
ATTUNE PSRP INSR SZ7 12 KNEE (Insert) ×1 IMPLANT
BAG COUNTER SPONGE SURGICOUNT (BAG) IMPLANT
BAG ZIPLOCK 12X15 (MISCELLANEOUS) ×2 IMPLANT
BASE TIBIA ATTUNE KNEE SYS SZ6 (Knees) IMPLANT
BLADE SAG 18X100X1.27 (BLADE) ×2 IMPLANT
BLADE SAW SGTL 11.0X1.19X90.0M (BLADE) ×2 IMPLANT
BNDG ELASTIC 6X5.8 VLCR STR LF (GAUZE/BANDAGES/DRESSINGS) ×2 IMPLANT
BOWL SMART MIX CTS (DISPOSABLE) ×2 IMPLANT
CEMENT HV SMART SET (Cement) ×4 IMPLANT
COVER SURGICAL LIGHT HANDLE (MISCELLANEOUS) ×2 IMPLANT
CUFF TOURN SGL QUICK 34 (TOURNIQUET CUFF) ×1
CUFF TRNQT CYL 34X4.125X (TOURNIQUET CUFF) ×1 IMPLANT
DRAPE INCISE IOBAN 66X45 STRL (DRAPES) ×2 IMPLANT
DRAPE U-SHAPE 47X51 STRL (DRAPES) ×2 IMPLANT
DRSG AQUACEL AG ADV 3.5X10 (GAUZE/BANDAGES/DRESSINGS) ×2 IMPLANT
DURAPREP 26ML APPLICATOR (WOUND CARE) ×2 IMPLANT
ELECT REM PT RETURN 15FT ADLT (MISCELLANEOUS) ×2 IMPLANT
GLOVE SRG 8 PF TXTR STRL LF DI (GLOVE) ×1 IMPLANT
GLOVE SURG ENC MOIS LTX SZ6.5 (GLOVE) ×2 IMPLANT
GLOVE SURG ENC MOIS LTX SZ8 (GLOVE) ×4 IMPLANT
GLOVE SURG UNDER POLY LF SZ7 (GLOVE) ×2 IMPLANT
GLOVE SURG UNDER POLY LF SZ8 (GLOVE) ×1
GLOVE SURG UNDER POLY LF SZ8.5 (GLOVE) ×2 IMPLANT
GOWN STRL REUS W/ TWL LRG LVL3 (GOWN DISPOSABLE) ×1 IMPLANT
GOWN STRL REUS W/TWL LRG LVL3 (GOWN DISPOSABLE) ×1
HANDPIECE INTERPULSE COAX TIP (DISPOSABLE) ×1
HOLDER FOLEY CATH W/STRAP (MISCELLANEOUS) IMPLANT
IMMOBILIZER KNEE 20 (SOFTGOODS) ×2
IMMOBILIZER KNEE 20 THIGH 36 (SOFTGOODS) ×1 IMPLANT
KIT TURNOVER KIT A (KITS) IMPLANT
MANIFOLD NEPTUNE II (INSTRUMENTS) ×2 IMPLANT
NS IRRIG 1000ML POUR BTL (IV SOLUTION) ×2 IMPLANT
PACK TOTAL KNEE CUSTOM (KITS) ×2 IMPLANT
PADDING CAST COTTON 6X4 STRL (CAST SUPPLIES) ×4 IMPLANT
PATELLA MEDIAL ATTUN 35MM KNEE (Knees) ×1 IMPLANT
PROTECTOR NERVE ULNAR (MISCELLANEOUS) ×2 IMPLANT
SET HNDPC FAN SPRY TIP SCT (DISPOSABLE) ×1 IMPLANT
SPIKE FLUID TRANSFER (MISCELLANEOUS) ×2 IMPLANT
STRIP CLOSURE SKIN 1/2X4 (GAUZE/BANDAGES/DRESSINGS) ×4 IMPLANT
SUT MNCRL AB 4-0 PS2 18 (SUTURE) ×2 IMPLANT
SUT STRATAFIX 0 PDS 27 VIOLET (SUTURE) ×2
SUT VIC AB 2-0 CT1 27 (SUTURE) ×3
SUT VIC AB 2-0 CT1 TAPERPNT 27 (SUTURE) ×3 IMPLANT
SUTURE STRATFX 0 PDS 27 VIOLET (SUTURE) ×1 IMPLANT
TIBIA ATTUNE KNEE SYS BASE SZ6 (Knees) ×2 IMPLANT
TRAY FOLEY MTR SLVR 16FR STAT (SET/KITS/TRAYS/PACK) ×2 IMPLANT
TUBE SUCTION HIGH CAP CLEAR NV (SUCTIONS) ×2 IMPLANT
WATER STERILE IRR 1000ML POUR (IV SOLUTION) ×4 IMPLANT
WRAP KNEE MAXI GEL POST OP (GAUZE/BANDAGES/DRESSINGS) ×2 IMPLANT

## 2021-09-05 NOTE — Interval H&P Note (Signed)
History and Physical Interval Note: ? ?09/05/2021 ?9:18 AM ? ?Frederick Pham  has presented today for surgery, with the diagnosis of right knee osteoarthritis.  The various methods of treatment have been discussed with the patient and family. After consideration of risks, benefits and other options for treatment, the patient has consented to  Procedure(s): ?TOTAL KNEE ARTHROPLASTY (Right) as a surgical intervention.  The patient's history has been reviewed, patient examined, no change in status, stable for surgery.  I have reviewed the patient's chart and labs.  Questions were answered to the patient's satisfaction.   ? ? ?[name] ? ? ?

## 2021-09-05 NOTE — Transfer of Care (Signed)
Immediate Anesthesia Transfer of Care Note ? ?Patient: Frederick Pham ? ?Procedure(s) Performed: TOTAL KNEE ARTHROPLASTY (Right: Knee) ? ?Patient Location: PACU ? ?Anesthesia Type:Spinal ? ?Level of Consciousness: drowsy and patient cooperative ? ?Airway & Oxygen Therapy: Patient Spontanous Breathing and Patient connected to face mask oxygen ? ?Post-op Assessment: Report given to RN and Post -op Vital signs reviewed and stable ? ?Post vital signs: stable ? ?Last Vitals:  ?Vitals Value Taken Time  ?BP 101/58 09/05/21 1311  ?Temp    ?Pulse 72 09/05/21 1315  ?Resp 21 09/05/21 1315  ?SpO2 100 % 09/05/21 1315  ?Vitals shown include unvalidated device data. ? ?Last Pain:  ?Vitals:  ? 09/05/21 0915  ?TempSrc:   ?PainSc: 0-No pain  ?   ? ?Patients Stated Pain Goal: 5 (09/05/21 0915) ? ?Complications: No notable events documented. ?

## 2021-09-05 NOTE — Evaluation (Signed)
Physical Therapy Evaluation ?Patient Details ?Name: Frederick Pham ?MRN: [id number] ?DOB: 10/12/1950 ?Today's Date: 09/05/2021 ? ?History of Present Illness ? Pt is a 71yo male presenting s/p R TKA on 09/05/21. PMH: OA  ?Clinical Impression ? MANUAL NAVARRA is a 71 y.o. male POD 0 s/p R-TKA. Patient reports independence with mobility at baseline. Patient is now limited by functional impairments (see PT problem list below) and requires min assist for transfers and gait with RW. Patient was able to ambulate 5 feet with RW and min assist. Patient instructed in exercise to facilitate ROM and circulation to manage edema. Patient will benefit from continued skilled PT interventions to address impairments and progress towards PLOF. Acute PT will follow to progress mobility and stair training in preparation for safe discharge home. ?   ?   ? ?Recommendations for follow up therapy are one component of a multi-disciplinary discharge planning process, led by the attending physician.  Recommendations may be updated based on patient status, additional functional criteria and insurance authorization. ? ?Follow Up Recommendations Follow physician's recommendations for discharge plan and follow up therapies ? ?  ?Assistance Recommended at Discharge Intermittent Supervision/Assistance  ?Patient can return home with the following ? A little help with walking and/or transfers;A little help with bathing/dressing/bathroom;Assistance with cooking/housework;Assist for transportation;Help with stairs or ramp for entrance ? ?  ?Equipment Recommendations None recommended by PT (Pt has recommended DME)  ?Recommendations for Other Services ?    ?  ?Functional Status Assessment Patient has had a recent decline in their functional status and demonstrates the ability to make significant improvements in function in a reasonable and predictable amount of time.  ? ?  ?Precautions / Restrictions Precautions ?Precautions: Fall ?Restrictions ?Weight  Bearing Restrictions: No ?LLE Weight Bearing: Weight bearing as tolerated  ? ?  ? ?Mobility ? Bed Mobility ?Overal bed mobility: Needs Assistance ?Bed Mobility: Supine to Sit ?  ?  ?Supine to sit: Min assist ?  ?  ?General bed mobility comments: Pt min assist for advancing RLE off bed and for scooting L hip forward to allow both feet to rest on the floor. moderate level of multimodal cuing for sequencing and redirection back to mobility task. ?  ? ?Transfers ?Overall transfer level: Needs assistance ?Equipment used: Rolling walker (2 wheels) ?Transfers: Sit to/from Stand ?Sit to Stand: Min assist ?  ?  ?  ?  ?  ?General transfer comment: Pt min assist for mild lift assist into standing via gait belt. moderate VC for sequencing, hand placement, and powering up through LLE and hands. ?  ? ?Ambulation/Gait ?Ambulation/Gait assistance: Min assist ?Gait Distance (Feet): 5 Feet ?Assistive device: Rolling walker (2 wheels) ?Gait Pattern/deviations: Step-to pattern, Decreased step length - right, Decreased step length - left, Decreased stance time - right, Decreased dorsiflexion - right, Decreased dorsiflexion - left, Decreased weight shift to right, Knee flexed in stance - right, Knees buckling ?Gait velocity: decreased ?  ?  ?General Gait Details: Pt required min assist for prevention of R knee buckling and appropriate, midline weightbearing. Demonstrated mild R knee buckling and decreased ability to DF to allow for complete foot clearance. Ambulation terminated for safety as it is likely the spinal had not completely worn off. No overt LOB noted. ? ?Stairs ?  ?  ?  ?  ?  ? ?Wheelchair Mobility ?  ? ?Modified Rankin (Stroke Patients Only) ?  ? ?  ? ?Balance Overall balance assessment: Needs assistance ?Sitting-balance support: Single extremity supported, Feet  supported ?Sitting balance-Leahy Scale: Poor ?  ?  ?Standing balance support: Reliant on assistive device for balance, During functional activity, Bilateral upper  extremity supported ?Standing balance-Leahy Scale: Poor ?Standing balance comment: Pt reliant on BUE support on RW and demonstrated mild lean to R and was unaware, but able to correct with cuing. ?  ?  ?  ?  ?  ?  ?  ?  ?  ?  ?  ?   ? ? ? ?Pertinent Vitals/Pain Pain Assessment ?Pain Assessment: 0-10 ?Pain Score: 0-No pain ?Pain Location: r knee  ? ? ?Home Living Family/patient expects to be discharged to:: Private residence ?Living Arrangements: Spouse/significant other [name]) ?Available Help at Discharge: Family;Available 24 hours/day ?Type of Home: House ?Home Access: Stairs to enter ?Entrance Stairs-Rails: None ?Entrance Stairs-Number of Steps: 5 ?Alternate Level Stairs-Number of Steps: 13 ?Home Layout: Multi-level (Pt can stay on first floor, but has to go upstairs to shower.) ?Home Equipment: Animator (2 wheels) ?   ?  ?Prior Function Prior Level of Function : Independent/Modified Independent ?  ?  ?  ?  ?  ?  ?Mobility Comments: IND ?ADLs Comments: Some assistance with dressing ?  ? ? ?Hand Dominance  ? Dominant Hand: Right ? ?  ?Extremity/Trunk Assessment  ? Upper Extremity Assessment ?Upper Extremity Assessment: LUE deficits/detail ?LUE Deficits / Details: Pt reports LUE tremor and weakness and this was noted at rest and during activity ?LUE Sensation: history of peripheral neuropathy;decreased proprioception ?  ? ?Lower Extremity Assessment ?Lower Extremity Assessment: RLE deficits/detail;LLE deficits/detail ?RLE Deficits / Details: MMT: ank pf/df 5/5, no extensor lag noted. ?RLE Sensation: decreased light touch ?LLE Deficits / Details: MMT: ank pf/df 5/5, pt reports history of peripheral neuropathy ?LLE Sensation: history of peripheral neuropathy ?  ? ?Cervical / Trunk Assessment ?Cervical / Trunk Assessment: Normal  ?Communication  ? Communication: No difficulties  ?Cognition Arousal/Alertness: Awake/alert ?Behavior During Therapy: [facility] for tasks assessed/performed ?Overall Cognitive  Status: Within Functional Limits for tasks assessed ?  ?  ?  ?  ?  ?  ?  ?  ?  ?  ?  ?  ?  ?  ?  ?  ?  ?  ?  ? ?  ?General Comments   ? ?  ?Exercises Total Joint Exercises ?Ankle Circles/Pumps: AROM, 20 reps, Both ?Quad Sets: AROM, Right, 5 reps (Max VC for form, frequency, and to not overdo these.)  ? ?Assessment/Plan  ?  ?PT Assessment Patient needs continued PT services  ?PT Problem List Decreased strength;Decreased range of motion;Decreased activity tolerance;Decreased balance;Decreased mobility;Decreased coordination;Decreased knowledge of use of DME;Decreased safety awareness;Pain ? ?   ?  ?PT Treatment Interventions DME instruction;Gait training;Stair training;Functional mobility training;Therapeutic activities;Therapeutic exercise;Balance training;Neuromuscular re-education;Patient/family education   ? ?PT Goals (Current goals can be found in the Care Plan section)  ?Acute Rehab PT Goals ?Patient Stated Goal: to keep pain at a manageable level ?PT Goal Formulation: With patient/family ?Time For Goal Achievement: 09/12/21 ?Potential to Achieve Goals: Good ? ?  ?Frequency 7X/week ?  ? ? ?Co-evaluation   ?  ?  ?  ?  ? ? ?  ?AM-PAC PT "6 Clicks" Mobility  ?Outcome Measure Help needed turning from your back to your side while in a flat bed without using bedrails?: A Little ?Help needed moving from lying on your back to sitting on the side of a flat bed without using bedrails?: A Little ?Help needed moving to and from a bed to  a chair (including a wheelchair)?: A Little ?Help needed standing up from a chair using your arms (e.g., wheelchair or bedside chair)?: A Little ?Help needed to walk in hospital room?: A Little ?Help needed climbing 3-5 steps with a railing? : A Little ?6 Click Score: 18 ? ?  ?End of Session Equipment Utilized During Treatment: Gait belt ?Activity Tolerance: Patient tolerated treatment well;No increased pain ?Patient left: in chair;with call bell/phone within reach;with chair alarm set;with  family/visitor present ?Nurse Communication: Mobility status ?PT Visit Diagnosis: Difficulty in walking, not elsewhere classified (R26.2);Pain;Unsteadiness on feet (R26.81) ?Pain - Right/Left: Right ?Pain -

## 2021-09-05 NOTE — Progress Notes (Signed)
Orthopedic Tech Progress Note ?Patient Details:  ?Frederick Pham ?09-22-1950 ?[id number] ? ?CPM Right Knee ?CPM Right Knee: On ?Right Knee Flexion (Degrees): 40 ?Right Knee Extension (Degrees): 10 ? ?Post Interventions ?Patient Tolerated: Well ?Instructions Provided: Care of device, Adjustment of device ? ?[name] ?09/05/2021, 1:15 PM ? ?

## 2021-09-05 NOTE — Anesthesia Postprocedure Evaluation (Signed)
Anesthesia Post Note ? ?Patient: YIFAN AUKER ? ?Procedure(s) Performed: TOTAL KNEE ARTHROPLASTY (Right: Knee) ? ?  ? ?Patient location during evaluation: PACU ?Anesthesia Type: Spinal ?Level of consciousness: awake and alert ?Pain management: pain level controlled ?Vital Signs Assessment: post-procedure vital signs reviewed and stable ?Respiratory status: spontaneous breathing and respiratory function stable ?Cardiovascular status: blood pressure returned to baseline and stable ?Postop Assessment: spinal receding ?Anesthetic complications: no ? ? ?No notable events documented. ? ?Last Vitals:  ?Vitals:  ? 09/05/21 1400 09/05/21 1415  ?BP: (!) 114/58 120/62  ?Pulse: 68 73  ?Resp: 11 14  ?Temp:    ?SpO2: 100% 99%  ?  ?Last Pain:  ?Vitals:  ? 09/05/21 1415  ?TempSrc:   ?PainSc: 0-No pain  ? ? ?  ?  ?  ?  ?  ?  ? ?Nana Hoselton DANIEL ? ? ? ? ?

## 2021-09-05 NOTE — Progress Notes (Signed)
Assisted Dr. Singer with right, adductor canal, ultrasound guided block. Side rails up, monitors on throughout procedure. See vital signs in flow sheet. Tolerated Procedure well.  

## 2021-09-05 NOTE — Anesthesia Procedure Notes (Signed)
Anesthesia Regional Block: Adductor canal block  ? ?Pre-Anesthetic Checklist: , timeout performed,  Correct Patient, Correct Site, Correct Laterality,  Correct Procedure, Correct Position, site marked,  Risks and benefits discussed,  Surgical consent,  Pre-op evaluation,  At surgeon's request and post-op pain management ? ?Laterality: Right ? ?Prep: chloraprep     ?  ?Needles:  ?Injection technique: Single-shot ? ?Needle Type: Stimulator Needle - 80   ? ? ?Needle Length: 10cm  ?Needle Gauge: 21  ? ? ? ?Additional Needles: ? ? ?Narrative:  ?Start time: 09/05/2021 10:28 AM ?End time: 09/05/2021 10:38 AM ?Injection made incrementally with aspirations every 5 mL. ? ?Performed by: Personally  ?Anesthesiologist: Duane Boston, MD ? ? ? ? ?

## 2021-09-05 NOTE — Discharge Instructions (Addendum)
? ?Gaynelle Arabian, MD ?Total Joint Specialist ?[facility] ?[address] ?[place], [place] [address] ?[phone] ? ?TOTAL KNEE REPLACEMENT POSTOPERATIVE DIRECTIONS ? ? ? ?Knee Rehabilitation, Guidelines Following Surgery  ?Results after knee surgery are often greatly improved when you follow the exercise, range of motion and muscle strengthening exercises prescribed by your doctor. Safety measures are also important to protect the knee from further injury. If any of these exercises cause you to have increased pain or swelling in your knee joint, decrease the amount until you are comfortable again and slowly increase them. If you have problems or questions, call your caregiver or physical therapist for advice.  ? ?BLOOD CLOT PREVENTION ?Take a 10 mg Xarelto once a day for three weeks following surgery. Then take an 81 mg Aspirin once a day for three weeks. Then discontinue Aspirin. ?You may resume your vitamins/supplements once you have discontinued the Xarelto. ?Do not take any NSAIDs (Advil, Aleve, Ibuprofen, Meloxicam, etc.) until you have discontinued the Xarelto.  ? ?Information on my medicine - XARELTO? (Rivaroxaban) ? ?Why was Xarelto? prescribed for you? ?Xarelto? was prescribed for you to reduce the risk of blood clots forming after orthopedic surgery. The medical term for these abnormal blood clots is venous thromboembolism (VTE). ? ?What do you need to know about xarelto? ? ?Take your Xarelto? ONCE DAILY at the same time every day. ?You may take it either with or without food. ? ?If you have difficulty swallowing the tablet whole, you may crush it and mix in applesauce just prior to taking your dose. ? ?Take Xarelto? exactly as prescribed by your doctor and DO NOT stop taking Xarelto? without talking to the doctor who prescribed the medication.  Stopping without other VTE prevention medication to take the place of Xarelto? may increase your risk of developing a clot. ? ?After  discharge, you should have regular check-up appointments with your healthcare provider that is prescribing your Xarelto?.   ? ?What do you do if you miss a dose? ?If you miss a dose, take it as soon as you remember on the same day then continue your regularly scheduled once daily regimen the next day. Do not take two doses of Xarelto? on the same day.  ? ?Important Safety Information ?A possible side effect of Xarelto? is bleeding. You should call your healthcare provider right away if you experience any of the following: ?Bleeding from an injury or your nose that does not stop. ?Unusual colored urine (red or dark brown) or unusual colored stools (red or black). ?Unusual bruising for unknown reasons. ?A serious fall or if you hit your head (even if there is no bleeding). ? ?Some medicines may interact with Xarelto? and might increase your risk of bleeding while on Xarelto?Marland Kitchen To help avoid this, consult your healthcare provider or pharmacist prior to using any new prescription or non-prescription medications, including herbals, vitamins, non-steroidal anti-inflammatory drugs (NSAIDs) and supplements. ? ?This website has more information on Xarelto?: https://guerra-benson.com/. ?  ? ?HOME CARE INSTRUCTIONS  ?Remove items at home which could result in a fall. This includes throw rugs or furniture in walking pathways.  ?ICE to the affected knee as much as tolerated. Icing helps control swelling. If the swelling is well controlled you will be more comfortable and rehab easier. Continue to use ice on the knee for pain and swelling from surgery. You may notice swelling that will progress down to the foot and ankle. This is normal after surgery. Elevate the leg  when you are not up walking on it.    ?Continue to use the breathing machine which will help keep your temperature down. It is common for your temperature to cycle up and down following surgery, especially at night when you are not up moving around and exerting yourself. The  breathing machine keeps your lungs expanded and your temperature down. ?Do not place pillow under the operative knee, focus on keeping the knee straight while resting ? ?DIET ?You may resume your previous home diet once you are discharged from the hospital. ? ?DRESSING / WOUND CARE / SHOWERING ?Keep your bulky bandage on for 2 days. On the third post-operative day you may remove the Ace bandage and gauze. There is a waterproof adhesive bandage on your skin which will stay in place until your first follow-up appointment. Once you remove this you will not need to place another bandage ?You may begin showering 3 days following surgery, but do not submerge the incision under water. ? ?ACTIVITY ?For the first 5 days, the key is rest and control of pain and swelling ?Do your home exercises twice a day starting on post-operative day 3. On the days you go to physical therapy, just do the home exercises once that day. ?You should rest, ice and elevate the leg for 50 minutes out of every hour. Get up and walk/stretch for 10 minutes per hour. After 5 days you can increase your activity slowly as tolerated. ?Walk with your walker as instructed. Use the walker until you are comfortable transitioning to a cane. Walk with the cane in the opposite hand of the operative leg. You may discontinue the cane once you are comfortable and walking steadily. ?Avoid periods of inactivity such as sitting longer than an hour when not asleep. This helps prevent blood clots.  ?You may discontinue the knee immobilizer once you are able to perform a straight leg raise while lying down. ?You may resume a sexual relationship in one month or when given the OK by your doctor.  ?You may return to work once you are cleared by your doctor.  ?Do not drive a car for 6 weeks or until released by your surgeon.  ?Do not drive while taking narcotics. ? ?TED HOSE STOCKINGS ?Wear the elastic stockings on both legs for three weeks following surgery during the day.  You may remove them at night for sleeping. ? ?WEIGHT BEARING ?Weight bearing as tolerated with assist device (walker, cane, etc) as directed, use it as long as suggested by your surgeon or therapist, typically at least 4-6 weeks. ? ?POSTOPERATIVE CONSTIPATION PROTOCOL ?Constipation - defined medically as fewer than three stools per week and severe constipation as less than one stool per week. ? ?One of the most common issues patients have following surgery is constipation.  Even if you have a regular bowel pattern at home, your normal regimen is likely to be disrupted due to multiple reasons following surgery.  Combination of anesthesia, postoperative narcotics, change in appetite and fluid intake all can affect your bowels.  In order to avoid complications following surgery, here are some recommendations in order to help you during your recovery period. ? ?Colace (docusate) - Pick up an over-the-counter form of Colace or another stool softener and take twice a day as long as you are requiring postoperative pain medications.  Take with a full glass of water daily.  If you experience loose stools or diarrhea, hold the colace until you stool forms back up. If your symptoms do  not get better within 1 week or if they get worse, check with your doctor. ?Dulcolax (bisacodyl) - Pick up over-the-counter and take as directed by the product packaging as needed to assist with the movement of your bowels.  Take with a full glass of water.  Use this product as needed if not relieved by Colace only.  ?MiraLax (polyethylene glycol) - Pick up over-the-counter to have on hand. MiraLax is a solution that will increase the amount of water in your bowels to assist with bowel movements.  Take as directed and can mix with a glass of water, juice, soda, coffee, or tea. Take if you go more than two days without a movement. Do not use MiraLax more than once per day. Call your doctor if you are still constipated or irregular after using this  medication for 7 days in a row. ? ?If you continue to have problems with postoperative constipation, please contact the office for further assistance and recommendations.  If you experience "the worst

## 2021-09-05 NOTE — Anesthesia Preprocedure Evaluation (Addendum)
Anesthesia Evaluation  ?Patient identified by MRN, date of birth, ID band ?Patient awake ? ? ? ?Reviewed: ?Allergy & Precautions, NPO status , Patient's Chart, lab work & pertinent test results ? ?History of Anesthesia Complications ?Negative for: history of anesthetic complications ? ?Airway ?Mallampati: II ? ?TM Distance: >3 FB ?Neck ROM: Full ? ? ? Dental ?no notable dental hx. ?(+) Dental Advisory Given ?  ?Pulmonary ?neg pulmonary ROS, former smoker,  ?  ?Pulmonary exam normal ? ? ? ? ? ? ? Cardiovascular ?negative cardio ROS ?Normal cardiovascular exam ? ? ?  ?Neuro/Psych ?negative neurological ROS ?   ? GI/Hepatic ?negative GI ROS, Neg liver ROS,   ?Endo/Other  ?negative endocrine ROS ? Renal/GU ?negative Renal ROS  ? ?  ?Musculoskeletal ? ?(+) Arthritis ,  ? Abdominal ?  ?Peds ? Hematology ? ?(+) Blood dyscrasia, , CLL   ?Anesthesia Other Findings ? ? Reproductive/Obstetrics ? ?  ? ? ? ? ? ? ? ? ? ? ? ? ? ?  ?  ? ? ? ? ? ? ? ?Anesthesia Physical ?Anesthesia Plan ? ?ASA: 3 ? ?Anesthesia Plan: Spinal  ? ?Post-op Pain Management: Regional block* and Ofirmev IV (intra-op)*  ? ?Induction:  ? ?PONV Risk Score and Plan: 2 and Propofol infusion and Ondansetron ? ?Airway Management Planned: Natural Airway and Simple Face Mask ? ?Additional Equipment:  ? ?Intra-op Plan:  ? ?Post-operative Plan:  ? ?Informed Consent: I have reviewed the patients History and Physical, chart, labs and discussed the procedure including the risks, benefits and alternatives for the proposed anesthesia with the patient or authorized representative who has indicated his/her understanding and acceptance.  ? ? ? ?Dental advisory given ? ?Plan Discussed with: Anesthesiologist and CRNA ? ?Anesthesia Plan Comments:   ? ? ? ? ? ?Anesthesia Quick Evaluation ? ?

## 2021-09-05 NOTE — Op Note (Signed)
OPERATIVE REPORT-TOTAL KNEE ARTHROPLASTY ? ? ?Pre-operative diagnosis- Osteoarthritis  Right knee(s) ? ?Post-operative diagnosis- Osteoarthritis Right knee(s) ? ?Procedure-  Right  Total Knee Arthroplasty ? ?Surgeon- Dione Plover. Dolton Shaker, MD ? ?Assistant- Kandee Keen, PA-C  ? ?Anesthesia-   Adductor canal block and spinal ? ?EBL- 25 ml ?  ?Drains None ? ?Tourniquet time-  ?Total Tourniquet Time Documented: ?Thigh (Right) - 38 minutes ?Total: Thigh (Right) - 38 minutes ?   ? ?Complications- None ? ?Condition-PACU - hemodynamically stable.  ? ?Brief Clinical Note  Frederick Pham is a 71 y.o. year old male with end stage OA of his right knee with progressively worsening pain and dysfunction. He has constant pain, with activity and at rest and significant functional deficits with difficulties even with ADLs. He has had extensive non-op management including analgesics, injections of cortisone, and home exercise program, but remains in significant pain with significant dysfunction. Radiographs show bone on bone arthritis medial and patellofemoral. He presents now for right Total Knee Arthroplasty.    ? ?Procedure in detail--- ? ? The patient is brought into the operating room and positioned supine on the operating table. After successful administration of  Adductor canal block and spinal,   a tourniquet is placed high on the  Right thigh(s) and the lower extremity is prepped and draped in the usual sterile fashion. Time out is performed by the operating team and then the  Right lower extremity is wrapped in Esmarch, knee flexed and the tourniquet inflated to 300 mmHg.  ?     A midline incision is made with a ten blade through the subcutaneous tissue to the level of the extensor mechanism. A fresh blade is used to make a medial parapatellar arthrotomy. Soft tissue over the proximal medial tibia is subperiosteally elevated to the joint line with a knife and into the semimembranosus bursa with a Cobb elevator. Soft  tissue over the proximal lateral tibia is elevated with attention being paid to avoiding the patellar tendon on the tibial tubercle. The patella is everted, knee flexed 90 degrees and the ACL and PCL are removed. Findings are bone on bone medial and patellofemoral with large global osteophytes   ?     The drill is used to create a starting hole in the distal femur and the canal is thoroughly irrigated with sterile saline to remove the fatty contents. The 5 degree Right  valgus alignment guide is placed into the femoral canal and the distal femoral cutting block is pinned to remove 9 mm off the distal femur. Resection is made with an oscillating saw. ?     The tibia is subluxed forward and the menisci are removed. The extramedullary alignment guide is placed referencing proximally at the medial aspect of the tibial tubercle and distally along the second metatarsal axis and tibial crest. The block is pinned to remove 11m off the more deficient medial  side. Resection is made with an oscillating saw. Size 6is the most appropriate size for the tibia and the proximal tibia is prepared with the modular drill and keel punch for that size. ?     The femoral sizing guide is placed and size 7 is most appropriate. Rotation is marked off the epicondylar axis and confirmed by creating a rectangular flexion gap at 90 degrees. The size 7 cutting block is pinned in this rotation and the anterior, posterior and chamfer cuts are made with the oscillating saw. The intercondylar block is then placed and that cut is made. ?  Trial size 6 tibial component, trial size 7 posterior stabilized femur and a 12  mm posterior stabilized rotating platform insert trial is placed. Full extension is achieved with excellent varus/valgus and anterior/posterior balance throughout full range of motion. The patella is everted and thickness measured to be 25  mm. Free hand resection is taken to 15 mm, a 38 template is placed, lug holes are drilled,  trial patella is placed, and it tracks normally. Osteophytes are removed off the posterior femur with the trial in place. All trials are removed and the cut bone surfaces prepared with pulsatile lavage. Cement is mixed and once ready for implantation, the size 6 tibial implant, size  7 posterior stabilized femoral component, and the size 38 patella are cemented in place and the patella is held with the clamp. The trial insert is placed and the knee held in full extension. The Exparel (20 ml mixed with 60 ml saline) is injected into the extensor mechanism, posterior capsule, medial and lateral gutters and subcutaneous tissues.  All extruded cement is removed and once the cement is hard the permanent 12 mm posterior stabilized rotating platform insert is placed into the tibial tray. ?     The wound is copiously irrigated with saline solution and the extensor mechanism closed with # 0 Stratofix suture. The tourniquet is released for a total tourniquet time of 38  minutes. Flexion against gravity is 140 degrees and the patella tracks normally. Subcutaneous tissue is closed with 2.0 vicryl and subcuticular with running 4.0 Monocryl. The incision is cleaned and dried and steri-strips and a bulky sterile dressing are applied. The limb is placed into a knee immobilizer and the patient is awakened and transported to recovery in stable condition. ?     Please note that a surgical assistant was a medical necessity for this procedure in order to perform it in a safe and expeditious manner. Surgical assistant was necessary to retract the ligaments and vital neurovascular structures to prevent injury to them and also necessary for proper positioning of the limb to allow for anatomic placement of the prosthesis. ? ? ?Dione Plover Jourden Gilson, MD ? ? ? ?09/05/2021, 12:35 PM ?' ? ?

## 2021-09-05 NOTE — Anesthesia Procedure Notes (Signed)
Procedure Name: [place] ?Date/Time: 09/05/2021 11:30 AM ?Performed by: Lissa Morales, CRNA ?Pre-anesthesia Checklist: Patient identified, Emergency Drugs available, Suction available and Patient being monitored ?Patient Re-evaluated:Patient Re-evaluated prior to induction ?Oxygen Delivery Method: Simple face mask ?Induction Type: IV induction ?Placement Confirmation: positive ETCO2 and breath sounds checked- equal and bilateral ?Dental Injury: Teeth and Oropharynx as per pre-operative assessment  ? ? ? ? ?

## 2021-09-06 ENCOUNTER — Encounter (HOSPITAL_COMMUNITY): Payer: Self-pay | Admitting: Orthopedic Surgery

## 2021-09-06 DIAGNOSIS — M1711 Unilateral primary osteoarthritis, right knee: Secondary | ICD-10-CM | POA: Diagnosis not present

## 2021-09-06 LAB — BASIC METABOLIC PANEL
Anion gap: 5 (ref 5–15)
BUN: 24 mg/dL — ABNORMAL HIGH (ref 8–23)
CO2: 28 mmol/L (ref 22–32)
Calcium: 8.4 mg/dL — ABNORMAL LOW (ref 8.9–10.3)
Chloride: 106 mmol/L (ref 98–111)
Creatinine, Ser: 1.28 mg/dL — ABNORMAL HIGH (ref 0.61–1.24)
GFR, Estimated: >60 mL/min (ref >60)
Glucose, Bld: 177 mg/dL — ABNORMAL HIGH (ref 70–99)
Potassium: 4.1 mmol/L (ref 3.5–5.1)
Sodium: 139 mmol/L (ref 135–145)

## 2021-09-06 MED ORDER — RIVAROXABAN 10 MG PO TABS: 10.0000 mg | ORAL_TABLET | Freq: Every day | ORAL | 0 refills | Status: DC

## 2021-09-06 MED ORDER — GABAPENTIN 300 MG PO CAPS: ORAL_CAPSULE | ORAL | 0 refills | Status: DC

## 2021-09-06 MED ORDER — BUPIVACAINE IN DEXTROSE 0.75-8.25 % IT SOLN
INTRATHECAL | Status: DC | PRN
  Administered 2021-09-05: 1.6 mL via INTRATHECAL

## 2021-09-06 MED ORDER — TRAMADOL HCL 50 MG PO TABS: 50.0000 mg | ORAL_TABLET | Freq: Four times a day (QID) | ORAL | 0 refills | Status: DC | PRN

## 2021-09-06 MED ORDER — OXYCODONE HCL 5 MG PO TABS: 5.0000 mg | ORAL_TABLET | Freq: Four times a day (QID) | ORAL | 0 refills | Status: DC | PRN

## 2021-09-06 MED ORDER — METHOCARBAMOL 500 MG PO TABS: 500.0000 mg | ORAL_TABLET | Freq: Four times a day (QID) | ORAL | 0 refills | Status: DC | PRN

## 2021-09-06 NOTE — Anesthesia Procedure Notes (Signed)
Spinal ? ?Patient location during procedure: OR ?Start time: 09/05/2021 11:17 AM ?End time: 09/05/2021 11:28 AM ?Reason for block: surgical anesthesia ?Staffing ?Performed: anesthesiologist  ?Anesthesiologist: Duane Boston, MD ?Preanesthetic Checklist ?Completed: patient identified, IV checked, risks and benefits discussed, surgical consent, monitors and equipment checked, pre-op evaluation and timeout performed ?Spinal Block ?Patient position: sitting ?Prep: DuraPrep ?Patient monitoring: cardiac monitor, continuous pulse ox and blood pressure ?Approach: midline ?Location: L2-3 ?Injection technique: single-shot ?Needle ?Needle type: Pencan  ?Needle gauge: 24 G ?Needle length: 9 cm ?Assessment ?Events: CSF return ?Additional Notes ?Functioning IV was confirmed and monitors were applied. Sterile prep and drape, including hand hygiene and sterile gloves were used. The patient was positioned and the spine was prepped. The skin was anesthetized with lidocaine.  Free flow of clear CSF was obtained prior to injecting local anesthetic into the CSF.  The spinal needle aspirated freely following injection.  The needle was carefully withdrawn.  The patient tolerated the procedure well.  ? ? ? ?

## 2021-09-06 NOTE — Addendum Note (Signed)
Addendum  created 09/06/21 1047 by Duane Boston, MD  ? Child order released for a procedure order, Clinical Note Signed, Intraprocedure Blocks edited, Intraprocedure Meds edited, SmartForm saved  ?  ?

## 2021-09-06 NOTE — Progress Notes (Signed)
Discharge instructions discussed with patient and family, verbalized agreement, and understanding ?

## 2021-09-06 NOTE — Discharge Summary (Signed)
Patient ID: ?Jameal Razzano Gonzalez ?MRN: [id number] ?DOB/AGE: [birth year] 71 y.o. ? ?Admit date: 09/05/2021 ?Discharge date: 09/06/2021 ? ?Admission Diagnoses:  ?Principal Problem: ?  OA (osteoarthritis) of knee ?Active Problems: ?  Osteoarthritis of right knee ? ? ?Discharge Diagnoses:  ?Same ? ?Past Medical History:  ?Diagnosis Date  ? Arthritis   ? Cancer [facility])   ? CLL (chronic lymphocytic leukemia) ([place])   ? ? ?Surgeries: Procedure(s): ?TOTAL KNEE ARTHROPLASTY on 09/05/2021 ?  ?Consultants:  ? ?Discharged Condition: Improved ? ?Hospital Course: BERN FARE is an 71 y.o. male who was admitted 09/05/2021 for operative treatment ofOA (osteoarthritis) of knee. Patient has severe unremitting pain that affects sleep, daily activities, and work/hobbies. After pre-op clearance the patient was taken to the operating room on 09/05/2021 and underwent  Procedure(s): ?TOTAL KNEE ARTHROPLASTY.   ? ?Patient was given perioperative antibiotics:  ?Anti-infectives (From admission, onward)  ? ? Start     Dose/Rate Route Frequency Ordered Stop  ? 09/05/21 1730  ceFAZolin (ANCEF) IVPB 2g/100 mL premix       ? 2 g ?200 mL/hr over 30 Minutes Intravenous Every 6 hours 09/05/21 1446 09/06/21 1148  ? 09/05/21 0900  ceFAZolin (ANCEF) IVPB 2g/100 mL premix       ? 2 g ?200 mL/hr over 30 Minutes Intravenous On call to O.R. 09/05/21 0849 09/05/21 1200  ? ?  ?  ? ?Patient was given sequential compression devices, early ambulation, and chemoprophylaxis to prevent DVT. ? ?Patient benefited maximally from hospital stay and there were no complications.   ? ?Recent vital signs: Patient Vitals for the past 24 hrs: ? BP Temp Temp src Pulse Resp SpO2  ?09/06/21 0934 (!) 104/56 98 ?F (36.7 ?C) -- 66 18 99 %  ?09/06/21 0623 123/67 97.7 ?F (36.5 ?C) Oral 69 18 99 %  ?09/06/21 0053 122/66 98 ?F (36.7 ?C) Oral 70 18 97 %  ?09/05/21 2042 120/62 97.9 ?F (36.6 ?C) Oral 82 18 99 %  ?09/05/21 1630 121/76 97.8 ?F (36.6 ?C) Oral 84 16 98 %  ?  ? ?Recent laboratory  studies:  ?Recent Labs  ?  09/05/21 ?0850 09/06/21 ?0302  ?WBC 56.0* 55.5*  ?HGB 13.4 10.9*  ?HCT 40.0 33.3*  ?PLT 134* 126*  ?NA  --  139  ?K  --  4.1  ?CL  --  106  ?CO2  --  28  ?BUN  --  24*  ?CREATININE  --  1.28*  ?GLUCOSE  --  177*  ?CALCIUM  --  8.4*  ? ? ? ?Discharge Medications:   ?Allergies as of 09/06/2021   ?No Known Allergies ?  ? ?  ?Medication List  ?  ? ?STOP taking these medications   ? ?B-COMPLEX PO ?  ?Fish Oil 1200 MG Caps ?  ?MAGNESIUM PO ?  ?vitamin C 1000 MG tablet ?  ?Vitamin D3 50 MCG (2000 UT) Tabs ?  ?vitamin E 1000 UNIT capsule ?  ? ?  ? ?TAKE these medications   ? ?gabapentin 300 MG capsule ?Commonly known as: NEURONTIN ?Take a 300 mg capsule three times a day for two weeks following surgery.Then take a 300 mg capsule two times a day for two weeks. Then take a 300 mg capsule once a day for two weeks. Then discontinue. ?  ?methocarbamol 500 MG tablet ?Commonly known as: ROBAXIN ?Take 1 tablet (500 mg total) by mouth every 6 (six) hours as needed for muscle spasms. ?  ?oxyCODONE 5 MG immediate release tablet ?  Commonly known as: Oxy IR/ROXICODONE ?Take 1-2 tablets (5-10 mg total) by mouth every 6 (six) hours as needed for severe pain. ?  ?rivaroxaban 10 MG Tabs tablet ?Commonly known as: XARELTO ?Take 1 tablet (10 mg total) by mouth daily with breakfast for 20 days. Then take one 81 mg aspirin once a day for three weeks. Then discontinue aspirin. ?  ?traMADol 50 MG tablet ?Commonly known as: ULTRAM ?Take 1-2 tablets (50-100 mg total) by mouth every 6 (six) hours as needed for moderate pain. ?  ? ?  ? ?  ?  ? ? ?  ?Discharge Care Instructions  ?(From admission, onward)  ?  ? ? ?  ? ?  Start     Ordered  ? 09/06/21 0000  Weight bearing as tolerated       ? 09/06/21 0713  ? 09/06/21 0000  Change dressing       ?Comments: You may remove the bulky bandage (ACE wrap and gauze) two days after surgery. You will have an adhesive waterproof bandage underneath. Leave this in place until your first  follow-up appointment.  ? 09/06/21 0713  ? ?  ?  ? ?  ? ? ?Diagnostic Studies: No results found. ? ?Disposition: Discharge disposition: 01-Home or Self Care ? ? ? ? ? ? ?Discharge Instructions   ? ? Call MD / Call 911   Complete by: As directed ?  ? If you experience chest pain or shortness of breath, CALL 911 and be transported to the hospital emergency room.  If you develope a fever above 101 F, pus (white drainage) or increased drainage or redness at the wound, or calf pain, call your surgeon's office.  ? Change dressing   Complete by: As directed ?  ? You may remove the bulky bandage (ACE wrap and gauze) two days after surgery. You will have an adhesive waterproof bandage underneath. Leave this in place until your first follow-up appointment.  ? Constipation Prevention   Complete by: As directed ?  ? Drink plenty of fluids.  Prune juice may be helpful.  You may use a stool softener, such as Colace (over the counter) 100 mg twice a day.  Use MiraLax (over the counter) for constipation as needed.  ? Diet - low sodium heart healthy   Complete by: As directed ?  ? Do not put a pillow under the knee. Place it under the heel.   Complete by: As directed ?  ? Driving restrictions   Complete by: As directed ?  ? No driving for two weeks  ? Post-operative opioid taper instructions:   Complete by: As directed ?  ? POST-OPERATIVE OPIOID TAPER INSTRUCTIONS: ?It is important to wean off of your opioid medication as soon as possible. If you do not need pain medication after your surgery it is ok to stop day one. ?Opioids include: ?Codeine, Hydrocodone(Norco, Vicodin), Oxycodone(Percocet, oxycontin) and hydromorphone amongst others.  ?Long term and even short term use of opiods can cause: ?Increased pain response ?Dependence ?Constipation ?Depression ?Respiratory depression ?And more.  ?Withdrawal symptoms can include ?Flu like symptoms ?Nausea, vomiting ?And more ?Techniques to manage these symptoms ?Hydrate well ?Eat regular  healthy meals ?Stay active ?Use relaxation techniques(deep breathing, meditating, yoga) ?Do Not substitute Alcohol to help with tapering ?If you have been on opioids for less than two weeks and do not have pain than it is ok to stop all together.  ?Plan to wean off of opioids ?This plan should start within one  week post op of your joint replacement. ?Maintain the same interval or time between taking each dose and first decrease the dose.  ?Cut the total daily intake of opioids by one tablet each day ?Next start to increase the time between doses. ?The last dose that should be eliminated is the evening dose.  ? ?  ? TED hose   Complete by: As directed ?  ? Use stockings (TED hose) for three weeks on both leg(s).  You may remove them at night for sleeping.  ? Weight bearing as tolerated   Complete by: As directed ?  ? ?  ? ? ? Follow-up Information   ? ? Gaynelle Arabian, MD. Schedule an appointment as soon as possible for a visit in 2 week(s).   ?Specialty: Orthopedic Surgery ?Contact information: ?[address] ?[address] ?[place] [address] ?[phone] ? ? ?  ?  ? ?  ?  ? ?  ? ? ? ?Signed: ?Theresa Duty ?09/06/2021, 3:15 PM ? ? ? ?

## 2021-09-06 NOTE — Progress Notes (Signed)
Physical Therapy Treatment ?Patient Details ?Name: Frederick Pham ?MRN: [id number] ?DOB: 1950-09-23 ?Today's Date: 09/06/2021 ? ? ?History of Present Illness Pt is a 71yo male presenting s/p R TKA on 09/05/21. PMH: OA; pt reports neuropathy ? ?  ?PT Comments  ? ? Pt with good quad activation, ROM, and pain control today. Spent increased time this session working on stairs, gait, and transfer training.  Pt is very analytical and focuses on physics on transfers.  He was often effected by therapist position (feeling like it through off his center of gravity trying to avoid therapist) even when therapist tried guarding at distance.  Pt with improved balance and sit to stands when therapist backed off at least 5'.  Tried multiple methods of stairs - pt's options for home are 5 steps no rails or 13 steps 1 rail on R going up/L down.  After multiple methods - pt did best with 1 rail on R sideways, advised this method with assist.  Wife was present and able to provide cues.  Pt with slower start of session but with practice was able to advance to level safe to return home with family.  ?  ?Recommendations for follow up therapy are one component of a multi-disciplinary discharge planning process, led by the attending physician.  Recommendations may be updated based on patient status, additional functional criteria and insurance authorization. ? ?Follow Up Recommendations ? Follow physician's recommendations for discharge plan and follow up therapies ?  ?  ?Assistance Recommended at Discharge Frequent or constant Supervision/Assistance  ?Patient can return home with the following A little help with walking and/or transfers;A little help with bathing/dressing/bathroom;Assistance with cooking/housework;Assist for transportation;Help with stairs or ramp for entrance ?  ?Equipment Recommendations ? None recommended by PT  ?  ?Recommendations for Other Services   ? ? ?  ?Precautions / Restrictions Precautions ?Precautions:  Fall ?Restrictions ?Weight Bearing Restrictions: Yes ?LLE Weight Bearing: Weight bearing as tolerated  ?  ? ?Mobility ? Bed Mobility ?Overal bed mobility: Needs Assistance ?Bed Mobility: Supine to Sit ?  ?  ?Supine to sit: Min assist ?  ?  ?General bed mobility comments: Increased time and required hand to pull forward.  Pt felt difficult because bed too soft and soaked up momentum ?  ? ?Transfers ?Overall transfer level: Needs assistance ?Equipment used: Rolling walker (2 wheels) ?Transfers: Sit to/from Stand ?Sit to Stand: Min assist, Supervision ?  ?  ?  ?  ?  ?General transfer comment: Practiced at least 15 sit to stands during session.  Pt struggled when therapist nearby b/c he kept trying to avoid therapist causing center of gravity to be posterior and pt going posteriorly .  Therapist tried guarding from distance but pt still had difficulty.  Therapist completely backed up 5' and pt was able to sit to stand without assist or going posteriorly.  Performed with and without armrest to simulate standing from bed. ?  ? ?Ambulation/Gait ?Ambulation/Gait assistance: Min guard ?Gait Distance (Feet): 50 Feet (50'x2) ?Assistive device: Rolling walker (2 wheels) ?Gait Pattern/deviations: Step-to pattern, Decreased stride length, Shuffle ?Gait velocity: decreased ?  ?  ?General Gait Details: Pt reports hx of neuropathy and they report short steps at baseline (slightly less now).  Knees were stable and good pain tolerance.  As with standing, pt's balance affected by therapist postion.  Therapist guarding with gait belt from distance posterior to pt. ? ? ?Stairs ?Stairs: Yes ?  ?  ?  ?General stair comments: Spent 20-30 mins  trying different versions of stair training with rest breaks.  Pt at times difficulty following sequencing commands - not understanding therapist wording and possibly over analysing the physics of stairs.  He did well with 3 steps 2 rails with cues for sequencing.  Tried backward with RW as pt has 5  STE without rails but pt not able to do more than 1 step with his tendency to lean posteriorly with therapist close.  Then tried steps with crutch and HHA and cane with HHA but pt unable to advance L foot up - again seeming limited by therapist being close.  They then report that pt can level entry the "man cave" where there are 2 bathrooms and pull out couch.  He could then get to next floor with flight of steps and rail on R going up.  Then practiced 5 steps x 2 with rail on R up -sideways.  First set with max cues and min A. But with practice pt performed next set easily with close guard and no assist.  Wife present and able to provide cues and feels comfortable with stairs.  Pt also reports will have neighbor/friend who is EMT to assist ? ? ?Wheelchair Mobility ?  ? ?Modified Rankin (Stroke Patients Only) ?  ? ? ?  ?Balance Overall balance assessment: Needs assistance ?Sitting-balance support: Single extremity supported, Feet supported ?Sitting balance-Leahy Scale: Good ?  ?  ?Standing balance support: No upper extremity supported, Single extremity supported, Bilateral upper extremity supported ?Standing balance-Leahy Scale: Fair ?Standing balance comment: Pt with varied balance during session.  Using RW for ambulation.  Initial sit to stands and when therapist close pt leaning posteriorly.  As pt practiced and PT backed off he could static stand without AD ?  ?  ?  ?  ?  ?  ?  ?  ?  ?  ?  ?  ? ?  ?Cognition Arousal/Alertness: Awake/alert ?Behavior During Therapy: [facility] for tasks assessed/performed ?Overall Cognitive Status: Within Functional Limits for tasks assessed ?  ?  ?  ?  ?  ?  ?  ?  ?  ?  ?  ?  ?  ?  ?  ?  ?  ?  ?  ? ?  ?Exercises Total Joint Exercises ?Ankle Circles/Pumps: AROM, Both, 10 reps, Supine ?Quad Sets: AROM, Both, 10 reps, Supine (5 sec hold; cues) ?Heel Slides: AROM, Right, 10 reps, Supine ?Hip ABduction/ADduction: AROM, Right, 10 reps, Supine ?Long Arc Quad: AROM, 10 reps, Right, Seated ?Knee  Flexion: AROM, Right, 10 reps, Seated ?Goniometric ROM: R knee 5 to 85 degrees ?Other Exercises ?Other Exercises: Cues for correct motion ? ?  ?General Comments  Educated on safe ice use, no pivots, car transfers, resting with leg straight, and TED hose during day. Also, encouraged walking every 1-2 hours during day. Educated on HEP with focus on mobility the first weeks. Discussed doing exercises within pain control and if pain increasing could decreased ROM, reps, and stop exercises as needed. Encouraged to perform quad sets and ankle pumps frequently for blood flow and to promote full knee extension. ? ?  ?  ? ?Pertinent Vitals/Pain Pain Assessment ?Pain Assessment: 0-10 ?Pain Score: 2  ?Pain Location: r knee ?Pain Descriptors / Indicators: Discomfort ?Pain Intervention(s): Limited activity within patient's tolerance, Monitored during session, Ice applied  ? ? ?Home Living   ?  ?  ?  ?  ?  ?  ?  ?  ?  ?   ?  ?  Prior Function    ?  ?  ?   ? ?PT Goals (current goals can now be found in the care plan section) Progress towards PT goals: Progressing toward goals ? ?  ?Frequency ? ? ? 7X/week ? ? ? ?  ?PT Plan Current plan remains appropriate  ? ? ?Co-evaluation   ?  ?  ?  ?  ? ?  ?AM-PAC PT "6 Clicks" Mobility   ?Outcome Measure ? Help needed turning from your back to your side while in a flat bed without using bedrails?: A Little ?Help needed moving from lying on your back to sitting on the side of a flat bed without using bedrails?: A Little ?Help needed moving to and from a bed to a chair (including a wheelchair)?: A Little ?Help needed standing up from a chair using your arms (e.g., wheelchair or bedside chair)?: A Little ?Help needed to walk in hospital room?: A Little ?Help needed climbing 3-5 steps with a railing? : A Little ?6 Click Score: 18 ? ?  ?End of Session Equipment Utilized During Treatment: Gait belt ?Activity Tolerance: Patient tolerated treatment well ?Patient left: in chair;with call bell/phone  within reach;with chair alarm set;with family/visitor present ?Nurse Communication: Mobility status ?PT Visit Diagnosis: Difficulty in walking, not elsewhere classified (R26.2);Pain;Unsteadiness on feet (R26.81) ?P

## 2021-09-06 NOTE — Progress Notes (Signed)
? ?  Subjective: ?1 Day Post-Op Procedure(s) (LRB): ?TOTAL KNEE ARTHROPLASTY (Right) ?Patient reports pain as mild.   ?Patient seen in rounds by Dr. Wynelle Link. ?Patient is well, and has had no acute complaints or problems No issues overnight. Denies chest pain, SOB, or calf pain. Foley catheter removed this AM.  ?We will continue therapy today, ambulated 5' yesterday.  ? ?Objective: ?Vital signs in last 24 hours: ?Temp:  [97.7 ?F (36.5 ?C)-98.2 ?F (36.8 ?C)] 97.7 ?F (36.5 ?C) (03/28 2633) ?Pulse Rate:  [60-102] 69 (03/28 3545) ?Resp:  [11-23] 18 (03/28 6256) ?BP: (104-147)/(58-76) 123/67 (03/28 3893) ?SpO2:  [97 %-100 %] 99 % (03/28 7342) ?Weight:  [63 kg] 63 kg (03/27 0915) ? ?Intake/Output from previous day: ? ?Intake/Output Summary (Last 24 hours) at 09/06/2021 0709 ?Last data filed at 09/06/2021 8768 ?Gross per 24 hour  ?Intake 3690 ml  ?Output 2800 ml  ?Net 890 ml  ?  ? ?Intake/Output this shift: ?No intake/output data recorded. ? ?Labs: ?Recent Labs  ?  09/05/21 ?0850 09/06/21 ?0302  ?HGB 13.4 10.9*  ? ?Recent Labs  ?  09/05/21 ?0850 09/06/21 ?0302  ?WBC 56.0* 55.5*  ?RBC 4.14* 3.51*  ?HCT 40.0 33.3*  ?PLT 134* 126*  ? ?Recent Labs  ?  09/06/21 ?0302  ?NA 139  ?K 4.1  ?CL 106  ?CO2 28  ?BUN 24*  ?CREATININE 1.28*  ?GLUCOSE 177*  ?CALCIUM 8.4*  ? ?No results for input(s): LABPT, INR in the last 72 hours. ? ?Exam: ?General - Patient is Alert and Oriented ?Extremity - Neurologically intact ?Neurovascular intact ?Sensation intact distally ?Dorsiflexion/Plantar flexion intact ?Dressing - dressing C/D/I ?Motor Function - intact, moving foot and toes well on exam.  ? ?Past Medical History:  ?Diagnosis Date  ? Arthritis   ? Cancer [facility])   ? CLL (chronic lymphocytic leukemia) ([place])   ? ? ?Assessment/Plan: ?1 Day Post-Op Procedure(s) (LRB): ?TOTAL KNEE ARTHROPLASTY (Right) ?Principal Problem: ?  OA (osteoarthritis) of knee ?Active Problems: ?  Osteoarthritis of right knee ? ?Estimated body mass index is 23.13 kg/m? as  calculated from the following: ?  Height as of this encounter: '5\' 5"'$  (1.651 m). ?  Weight as of this encounter: 63 kg. ?Advance diet ?Up with therapy ?D/C IV fluids ? ? ?Patient's anticipated LOS is less than 2 midnights, meeting these requirements: ?- Lives within 1 hour of care ?- Has a competent adult at home to recover with post-op recover ?- NO history of ? - Chronic pain requiring opiods ? - Diabetes ? - Coronary Artery Disease ? - Heart failure ? - Heart attack ? - Stroke ? - DVT/VTE ? - Cardiac arrhythmia ? - Respiratory Failure/COPD ? - Renal failure ? - Anemia ? - Advanced Liver disease ? ?DVT Prophylaxis - Xarelto ?Weight bearing as tolerated. ?Continue therapy. ? ?WBC count actually lower this AM (pt with known active CLL). ?Platelet count stable. ? ?Plan is to go Home after hospital stay. ?Plan for discharge later today if progresses with therapy and meeting goals. ?Scheduled for OPPT at [facility]). ?Follow-up in the office in 2 weeks. ? ?The PDMP database was reviewed today prior to any opioid medications being prescribed to this patient. ? ?Theresa Duty, PA-C ?Orthopedic Surgery ?[phone] ?09/06/2021, 7:09 AM ? ?

## 2021-09-06 NOTE — TOC Transition Note (Signed)
Transition of Care (TOC) - CM/SW Discharge Note ? ? ?Patient Details  ?Name: Frederick Pham ?MRN: [id number] ?Date of Birth: 01-07-1951 ? ?Transition of Care (TOC) CM/SW Contact:  ?Shirlee Whitmire, LCSW ?Phone Number: ?09/06/2021, 2:21 PM ? ? ?Clinical Narrative:    ?Pt cleared for dc home today. Received rw via [place] and plan for OPPT in [facility].  No TOC needs. ? ? ?Final next level of care: OP Rehab ?Barriers to Discharge: No Barriers Identified ? ? ?Patient Goals and CMS Choice ?Patient states their goals for this hospitalization and ongoing recovery are:: return home ?  ?  ? ?Discharge Placement ?  ?           ?  ?  ?  ?  ? ?Discharge Plan and Services ?  ?  ?           ?DME Arranged: Walker rolling ?DME Agency: [employer] ?  ?  ?  ?  ?  ?  ?  ?  ? ?Social Determinants of Health (SDOH) Interventions ?  ? ? ?Readmission Risk Interventions ?   ? View : No data to display.  ?  ?  ?  ? ? ? ? ? ?

## 2021-09-12 ENCOUNTER — Inpatient Hospital Stay (HOSPITAL_COMMUNITY)
Admission: EM | Admit: 2021-09-12 | Discharge: 2021-09-17 | DRG: 522 | Disposition: A | Discharge status: Skilled Nursing Facility | Payer: Medicare Other | Source: Skilled Nursing Facility | Attending: Internal Medicine | Admitting: Internal Medicine

## 2021-09-12 ENCOUNTER — Encounter (HOSPITAL_COMMUNITY): Payer: Self-pay

## 2021-09-12 ENCOUNTER — Emergency Department (HOSPITAL_COMMUNITY): Payer: Medicare Other

## 2021-09-12 ENCOUNTER — Other Ambulatory Visit: Payer: Self-pay

## 2021-09-12 DIAGNOSIS — G8929 Other chronic pain: Secondary | ICD-10-CM | POA: Diagnosis present

## 2021-09-12 DIAGNOSIS — Z87891 Personal history of nicotine dependence: Secondary | ICD-10-CM

## 2021-09-12 DIAGNOSIS — D72820 Lymphocytosis (symptomatic): Secondary | ICD-10-CM | POA: Diagnosis not present

## 2021-09-12 DIAGNOSIS — C9110 Chronic lymphocytic leukemia of B-cell type not having achieved remission: Secondary | ICD-10-CM | POA: Diagnosis present

## 2021-09-12 DIAGNOSIS — W19XXXA Unspecified fall, initial encounter: Secondary | ICD-10-CM

## 2021-09-12 DIAGNOSIS — Y92129 Unspecified place in nursing home as the place of occurrence of the external cause: Secondary | ICD-10-CM

## 2021-09-12 DIAGNOSIS — M7989 Other specified soft tissue disorders: Secondary | ICD-10-CM

## 2021-09-12 DIAGNOSIS — D72829 Elevated white blood cell count, unspecified: Secondary | ICD-10-CM

## 2021-09-12 DIAGNOSIS — M1990 Unspecified osteoarthritis, unspecified site: Secondary | ICD-10-CM | POA: Diagnosis present

## 2021-09-12 DIAGNOSIS — R451 Restlessness and agitation: Secondary | ICD-10-CM | POA: Diagnosis not present

## 2021-09-12 DIAGNOSIS — S72002A Fracture of unspecified part of neck of left femur, initial encounter for closed fracture: Secondary | ICD-10-CM | POA: Diagnosis not present

## 2021-09-12 DIAGNOSIS — W010XXA Fall on same level from slipping, tripping and stumbling without subsequent striking against object, initial encounter: Secondary | ICD-10-CM | POA: Diagnosis present

## 2021-09-12 DIAGNOSIS — D638 Anemia in other chronic diseases classified elsewhere: Secondary | ICD-10-CM

## 2021-09-12 DIAGNOSIS — Z96651 Presence of right artificial knee joint: Secondary | ICD-10-CM | POA: Diagnosis present

## 2021-09-12 DIAGNOSIS — M25561 Pain in right knee: Secondary | ICD-10-CM | POA: Diagnosis not present

## 2021-09-12 DIAGNOSIS — R410 Disorientation, unspecified: Secondary | ICD-10-CM

## 2021-09-12 HISTORY — DX: Fracture of unspecified part of neck of left femur, initial encounter for closed fracture: S72.002A

## 2021-09-12 MED ORDER — HYDROCODONE-ACETAMINOPHEN 5-325 MG PO TABS
1.0000 | ORAL_TABLET | Freq: Four times a day (QID) | ORAL | Status: DC | PRN
  Filled 2021-09-12: qty 2

## 2021-09-12 MED ORDER — MORPHINE SULFATE (PF) 4 MG/ML IV SOLN
4.0000 mg | INTRAVENOUS | Status: DC | PRN
  Administered 2021-09-13: 4 mg via INTRAVENOUS
  Filled 2021-09-12: qty 1

## 2021-09-12 MED ORDER — ONDANSETRON HCL 4 MG/2ML IJ SOLN
4.0000 mg | Freq: Once | INTRAMUSCULAR | Status: AC | PRN
  Administered 2021-09-13: 4 mg via INTRAVENOUS
  Filled 2021-09-12: qty 2

## 2021-09-12 NOTE — ED Notes (Signed)
Called Orthopedic Surgery to let them know pt. Arrived to the Emergency Department. ?

## 2021-09-12 NOTE — ED Notes (Signed)
Pt transported for x-rays. Will obtain ekg once he returns to room. ?

## 2021-09-12 NOTE — Assessment & Plan Note (Addendum)
-  Hip fx pathway-strict bedrest pending surgery. ?-Plan for surgical repair 4/5. ?-Pain control and DVT prophylaxis per surgery. ?-PT/OT after surgery ?

## 2021-09-12 NOTE — Assessment & Plan Note (Addendum)
Significant swelling and bruising in RLE.  Venous Doppler negative for DVT.  ?Per orthopedic surgery. ?

## 2021-09-12 NOTE — ED Provider Notes (Signed)
? ?[facility] DEPT ?Provider Note: Georgena Spurling, MD, FACEP ? ?CSN: [id number] ?MRN: [id number] ?ARRIVAL: 09/12/21 at 2300 ?ROOM: RESA/RESA ? ? ?CHIEF COMPLAINT  ?Hip Injury ? ? ?HISTORY OF PRESENT ILLNESS  ?09/12/21 11:18 PM ?Frederick Pham is a 71 y.o. male who underwent a right total knee replacement on 09/05/2021 by Dr. Wynelle Link of [facility].  He was at rehab today at an outlying facility where he injured his left hip.  X-rays showed fracture of the left hip joint and he was transferred here for definitive care by [facility].  He was accepted by Dr. Kathaleen Bury.  The patient denies significant pain at this time. ? ? ?Past Medical History:  ?Diagnosis Date  ? Arthritis   ? CLL (chronic lymphocytic leukemia) ([place])   ? ? ?Past Surgical History:  ?Procedure Laterality Date  ? FRACTURE SURGERY    ? age 29  ? TONSILLECTOMY    ? age 6  ? TOTAL KNEE ARTHROPLASTY Right 09/05/2021  ? Procedure: TOTAL KNEE ARTHROPLASTY;  Surgeon: Gaynelle Arabian, MD;  Location: [facility];  Service: Orthopedics;  Laterality: Right;  ? WISDOM TOOTH EXTRACTION    ? ? ?Family History  ?Adopted: Yes  ? ? ?Social History  ? ?Tobacco Use  ? Smoking status: Former  ?  Packs/day: 1.00  ?  Years: 10.00  ?  Pack years: 10.00  ?  Types: Cigarettes  ?  Quit date: 10/08/1990  ?  Years since quitting: 30.9  ? Smokeless tobacco: Never  ?Vaping Use  ? Vaping Use: Never used  ?Substance Use Topics  ? Alcohol use: Yes  ?  Alcohol/week: 1.0 - 2.0 standard drink  ?  Types: 1 - 2 Standard drinks or equivalent per week  ?  Comment: occas.  ? Drug use: Never  ? ? ?Prior to Admission medications   ?Medication Sig Start Date End Date Taking? Authorizing Provider  ?gabapentin (NEURONTIN) 300 MG capsule Take a 300 mg capsule three times a day for two weeks following surgery.Then take a 300 mg capsule two times a day for two weeks. Then take a 300 mg capsule once a day for two weeks. Then discontinue. 09/06/21   Edmisten, Ok Anis, PA  ?methocarbamol (ROBAXIN) 500 MG tablet  Take 1 tablet (500 mg total) by mouth every 6 (six) hours as needed for muscle spasms. 09/06/21   Edmisten, Ok Anis, PA  ?oxyCODONE (OXY IR/ROXICODONE) 5 MG immediate release tablet Take 1-2 tablets (5-10 mg total) by mouth every 6 (six) hours as needed for severe pain. 09/06/21   Edmisten, Ok Anis, PA  ?rivaroxaban (XARELTO) 10 MG TABS tablet Take 1 tablet (10 mg total) by mouth daily with breakfast for 20 days. Then take one 81 mg aspirin once a day for three weeks. Then discontinue aspirin. 09/06/21 09/26/21  Edmisten, Ok Anis, PA  ?traMADol (ULTRAM) 50 MG tablet Take 1-2 tablets (50-100 mg total) by mouth every 6 (six) hours as needed for moderate pain. 09/06/21   Edmisten, Ok Anis, PA  ? ? ?Allergies ?Patient has no known allergies. ? ? ?REVIEW OF SYSTEMS  ?Negative except as noted here or in the History of Present Illness. ? ? ?PHYSICAL EXAMINATION  ?Initial Vital Signs ?Blood pressure (!) 148/80, pulse 98, temperature 98.8 ?F (37.1 ?C), temperature source Oral, resp. rate 16, SpO2 96 %. ? ?Examination ?General: Well-developed, well-nourished male in no acute distress; appearance consistent with age of record ?HENT: normocephalic; atraumatic ?Eyes: pupils equal, round and reactive to light; extraocular muscles intact ?Neck:  supple ?Heart: regular rate and rhythm ?Lungs: clear to auscultation bilaterally ?Abdomen: soft; nondistended; nontender; bowel sounds present ?Extremities: Ecchymosis of right lower extremity with bandage over anterior right knee; external rotation of the left lower extremity with pain on passive movement ?Neurologic: Awake, alert and oriented; motor function intact in all extremities and symmetric; no facial droop ?Skin: Warm and dry ?Psychiatric: Normal mood and affect ? ? ?RESULTS  ?Summary of this visit's results, reviewed and interpreted by myself: ? ? EKG Interpretation ? ?Date/Time:    ?Ventricular Rate:    ?PR Interval:    ?QRS Duration:   ?QT Interval:    ?QTC Calculation:   ?R  Axis:     ?Text Interpretation:   ?  ? ?  ? ?Laboratory Studies: ?No results found for this or any previous visit (from the past 24 hour(s)). ?Imaging Studies: ?No results found. ? ?ED COURSE and MDM  ?Nursing notes, initial and subsequent vitals signs, including pulse oximetry, reviewed and interpreted by myself. ? ?Vitals:  ? 09/12/21 2303  ?BP: (!) 148/80  ?Pulse: 98  ?Resp: 16  ?Temp: 98.8 ?F (37.1 ?C)  ?TempSrc: Oral  ?SpO2: 96%  ? ?Medications  ?ondansetron (ZOFRAN) injection 4 mg (has no administration in time range)  ?morphine (PF) 4 MG/ML injection 4 mg (has no administration in time range)  ?HYDROcodone-acetaminophen (NORCO/VICODIN) 5-325 MG per tablet 1-2 tablet (has no administration in time range)  ? ?[facility] has already been consulted for the patient's fracture.  We will have the patient admitted to the hospitalist service. ? ?11:43 PM ?Dr. Alcario Drought to admit to hospitalist service. ? ?PROCEDURES  ?Procedures ? ? ?ED DIAGNOSES  ? ?  ICD-10-CM   ?1. Closed fracture of left hip, initial encounter ([place])  S72.002A   ?  ? ? ? ?  ?Shanon Rosser, MD ?09/13/21 0720 ? ?

## 2021-09-12 NOTE — Assessment & Plan Note (Addendum)
Followed by Dr. Lorenso Courier.  WBC elevated to 50,000 (baseline). ?-Plan was continued observation per oncology note on 08/24/2021. ?

## 2021-09-12 NOTE — ED Triage Notes (Signed)
Pt. BIB [facility] from [facility] with a hip L fracture. [facility] notes external rotation to the extremity. Pt. Given morphine before leaving [facility] facility. ?

## 2021-09-13 ENCOUNTER — Inpatient Hospital Stay (HOSPITAL_COMMUNITY): Payer: Medicare Other

## 2021-09-13 ENCOUNTER — Inpatient Hospital Stay (HOSPITAL_COMMUNITY): Payer: Medicare Other | Admitting: Anesthesiology

## 2021-09-13 ENCOUNTER — Telehealth: Payer: Self-pay | Admitting: *Deleted

## 2021-09-13 DIAGNOSIS — M25561 Pain in right knee: Secondary | ICD-10-CM

## 2021-09-13 DIAGNOSIS — S72002A Fracture of unspecified part of neck of left femur, initial encounter for closed fracture: Secondary | ICD-10-CM | POA: Diagnosis not present

## 2021-09-13 DIAGNOSIS — W19XXXA Unspecified fall, initial encounter: Secondary | ICD-10-CM

## 2021-09-13 DIAGNOSIS — D638 Anemia in other chronic diseases classified elsewhere: Secondary | ICD-10-CM

## 2021-09-13 DIAGNOSIS — M7989 Other specified soft tissue disorders: Secondary | ICD-10-CM | POA: Diagnosis not present

## 2021-09-13 DIAGNOSIS — Y92129 Unspecified place in nursing home as the place of occurrence of the external cause: Secondary | ICD-10-CM

## 2021-09-13 DIAGNOSIS — G8929 Other chronic pain: Secondary | ICD-10-CM

## 2021-09-13 DIAGNOSIS — C9110 Chronic lymphocytic leukemia of B-cell type not having achieved remission: Secondary | ICD-10-CM

## 2021-09-13 DIAGNOSIS — D72820 Lymphocytosis (symptomatic): Secondary | ICD-10-CM

## 2021-09-13 HISTORY — DX: Anemia in other chronic diseases classified elsewhere: D63.8

## 2021-09-13 LAB — CBC WITH DIFFERENTIAL/PLATELET
Abs Immature Granulocytes: 0.03 10*3/uL (ref 0.00–0.07)
Basophils Absolute: 0.1 10*3/uL (ref 0.0–0.1)
Basophils Relative: 0 %
Eosinophils Absolute: 0 10*3/uL (ref 0.0–0.5)
Eosinophils Relative: 0 %
HCT: 30.1 % — ABNORMAL LOW (ref 39.0–52.0)
Hemoglobin: 9.8 g/dL — ABNORMAL LOW (ref 13.0–17.0)
Immature Granulocytes: 0 %
Lymphocytes Relative: 94 %
Lymphs Abs: 48 10*3/uL — ABNORMAL HIGH (ref 0.7–4.0)
MCH: 31.2 pg (ref 26.0–34.0)
MCHC: 32.6 g/dL (ref 30.0–36.0)
MCV: 95.9 fL (ref 80.0–100.0)
Monocytes Absolute: 0.2 10*3/uL (ref 0.1–1.0)
Monocytes Relative: 1 %
Neutro Abs: 2.5 10*3/uL (ref 1.7–7.7)
Neutrophils Relative %: 5 %
Platelets: 163 10*3/uL (ref 150–400)
RBC: 3.14 MIL/uL — ABNORMAL LOW (ref 4.22–5.81)
RDW: 13.9 % (ref 11.5–15.5)
WBC: 50.9 10*3/uL (ref 4.0–10.5)
nRBC: 0 % (ref 0.0–0.2)

## 2021-09-13 LAB — CBC
HCT: 33.3 % — ABNORMAL LOW (ref 39.0–52.0)
Hemoglobin: 10.9 g/dL — ABNORMAL LOW (ref 13.0–17.0)
MCH: 31.1 pg (ref 26.0–34.0)
MCHC: 32.7 g/dL (ref 30.0–36.0)
MCV: 94.9 fL (ref 80.0–100.0)
Platelets: 126 10*3/uL — ABNORMAL LOW (ref 150–400)
RBC: 3.51 MIL/uL — ABNORMAL LOW (ref 4.22–5.81)
RDW: 13.9 % (ref 11.5–15.5)
WBC: 55.5 10*3/uL (ref 4.0–10.5)
nRBC: 0 % (ref 0.0–0.2)

## 2021-09-13 LAB — BASIC METABOLIC PANEL
Anion gap: 8 (ref 5–15)
BUN: 26 mg/dL — ABNORMAL HIGH (ref 8–23)
CO2: 28 mmol/L (ref 22–32)
Calcium: 8.4 mg/dL — ABNORMAL LOW (ref 8.9–10.3)
Chloride: 101 mmol/L (ref 98–111)
Creatinine, Ser: 1.1 mg/dL (ref 0.61–1.24)
GFR, Estimated: >60 mL/min (ref >60)
Glucose, Bld: 125 mg/dL — ABNORMAL HIGH (ref 70–99)
Potassium: 4.5 mmol/L (ref 3.5–5.1)
Sodium: 137 mmol/L (ref 135–145)

## 2021-09-13 LAB — ABO/RH: ABO/RH(D): A POS

## 2021-09-13 LAB — TYPE AND SCREEN
ABO/RH(D): A POS
Antibody Screen: NEGATIVE

## 2021-09-13 LAB — SURGICAL PCR SCREEN
MRSA, PCR: NEGATIVE
Staphylococcus aureus: NEGATIVE

## 2021-09-13 LAB — HIV ANTIBODY (ROUTINE TESTING W REFLEX): HIV Screen 4th Generation wRfx: NONREACTIVE

## 2021-09-13 MED ORDER — MIDAZOLAM HCL 2 MG/2ML IJ SOLN: 1.0000 mg | INTRAMUSCULAR | Status: DC

## 2021-09-13 MED ORDER — METHOCARBAMOL 500 MG PO TABS: 500.0000 mg | ORAL_TABLET | Freq: Four times a day (QID) | ORAL | Status: DC | PRN

## 2021-09-13 MED ORDER — MIDAZOLAM HCL 2 MG/2ML IJ SOLN
INTRAMUSCULAR | Status: AC
  Filled 2021-09-13: qty 2

## 2021-09-13 MED ORDER — METHOCARBAMOL 1000 MG/10ML IJ SOLN: 500.0000 mg | Freq: Four times a day (QID) | INTRAVENOUS | Status: DC | PRN

## 2021-09-13 MED ORDER — FENTANYL CITRATE PF 50 MCG/ML IJ SOSY: 50.0000 ug | PREFILLED_SYRINGE | INTRAMUSCULAR | Status: DC

## 2021-09-13 MED ORDER — MELATONIN 3 MG PO TABS
3.0000 mg | ORAL_TABLET | Freq: Once | ORAL | Status: DC
  Filled 2021-09-13: qty 1

## 2021-09-13 MED ORDER — ROPIVACAINE HCL 5 MG/ML IJ SOLN
INTRAMUSCULAR | Status: DC | PRN
  Administered 2021-09-13 (×2): 30 mL via PERINEURAL

## 2021-09-13 MED ORDER — OXYCODONE HCL 5 MG PO TABS
5.0000 mg | ORAL_TABLET | ORAL | Status: DC | PRN
  Administered 2021-09-13: 10 mg via ORAL
  Filled 2021-09-13: qty 2

## 2021-09-13 MED ORDER — TRAMADOL HCL 50 MG PO TABS: 50.0000 mg | ORAL_TABLET | Freq: Four times a day (QID) | ORAL | Status: DC | PRN

## 2021-09-13 MED ORDER — ENOXAPARIN SODIUM 40 MG/0.4ML IJ SOSY
40.0000 mg | PREFILLED_SYRINGE | INTRAMUSCULAR | Status: AC
  Administered 2021-09-13: 40 mg via SUBCUTANEOUS
  Filled 2021-09-13: qty 0.4

## 2021-09-13 MED ORDER — ADULT MULTIVITAMIN W/MINERALS CH
1.0000 | ORAL_TABLET | Freq: Every day | ORAL | Status: DC
  Filled 2021-09-13 (×2): qty 1

## 2021-09-13 MED ORDER — OXYCODONE HCL 5 MG PO TABS: 5.0000 mg | ORAL_TABLET | Freq: Four times a day (QID) | ORAL | Status: DC | PRN

## 2021-09-13 MED ORDER — LACTATED RINGERS IV SOLN: INTRAVENOUS | Status: DC

## 2021-09-13 MED ORDER — HALOPERIDOL LACTATE 5 MG/ML IJ SOLN: 1.0000 mg | Freq: Once | INTRAMUSCULAR | Status: DC

## 2021-09-13 MED ORDER — METHOCARBAMOL 1000 MG/10ML IJ SOLN
500.0000 mg | Freq: Four times a day (QID) | INTRAVENOUS | Status: DC | PRN
  Filled 2021-09-13: qty 5

## 2021-09-13 MED ORDER — ENSURE SURGERY PO LIQD
237.0000 mL | Freq: Two times a day (BID) | ORAL | Status: DC
  Administered 2021-09-15 – 2021-09-17 (×4): 237 mL via ORAL

## 2021-09-13 MED ORDER — FENTANYL CITRATE PF 50 MCG/ML IJ SOSY
PREFILLED_SYRINGE | INTRAMUSCULAR | Status: AC
  Filled 2021-09-13: qty 2

## 2021-09-13 NOTE — Consult Note (Signed)
? ? ?Patient ID: ?Frederick Pham ?MRN: [id number] ?DOB/AGE: 1950/12/04 71 y.o. ? ?Admit date: 09/12/2021 ? ?Chief Complaint: ?Left hip pain. ? ?Subjective: ?Frederick Pham, 71 y/o male, PMH significant for right TKA on 09/05/2021 and active CLL presented to the ED at [facility] due to left hip pain following a fall at home. Patient was working on exercises for his right knee, when he lost his balance and fell directly onto his left side. Had immediate severe pain and was unable to bear weight. Workup found displaced closed left femoral neck fracture. Patient transferred to [facility] for further management and was admitted. Orthopedics consulted last night.  ? ?Allergies: ?No Known Allergies  ? ?Medications: ?Medications Prior to Admission  ?Medication Sig Dispense Refill Last Dose  ? gabapentin (NEURONTIN) 300 MG capsule Take a 300 mg capsule three times a day for two weeks following surgery.Then take a 300 mg capsule two times a day for two weeks. Then take a 300 mg capsule once a day for two weeks. Then discontinue. 84 capsule 0   ? methocarbamol (ROBAXIN) 500 MG tablet Take 1 tablet (500 mg total) by mouth every 6 (six) hours as needed for muscle spasms. 40 tablet 0   ? oxyCODONE (OXY IR/ROXICODONE) 5 MG immediate release tablet Take 1-2 tablets (5-10 mg total) by mouth every 6 (six) hours as needed for severe pain. 42 tablet 0   ? rivaroxaban (XARELTO) 10 MG TABS tablet Take 1 tablet (10 mg total) by mouth daily with breakfast for 20 days. Then take one 81 mg aspirin once a day for three weeks. Then discontinue aspirin. 20 tablet 0   ? traMADol (ULTRAM) 50 MG tablet Take 1-2 tablets (50-100 mg total) by mouth every 6 (six) hours as needed for moderate pain. 40 tablet 0   ? ? ?Past Medical History: ?Past Medical History:  ?Diagnosis Date  ? Arthritis   ? CLL (chronic lymphocytic leukemia) ([place])   ?  ? ?Past Surgical History: ?Past Surgical History:  ?Procedure Laterality Date  ? FRACTURE SURGERY    ? age 85  ?  TONSILLECTOMY    ? age 36  ? TOTAL KNEE ARTHROPLASTY Right 09/05/2021  ? Procedure: TOTAL KNEE ARTHROPLASTY;  Surgeon: Gaynelle Arabian, MD;  Location: [facility];  Service: Orthopedics;  Laterality: Right;  ? WISDOM TOOTH EXTRACTION    ?  ? ?Family History: ?Family History  ?Adopted: Yes  ? ? ?Social History: ?Social History  ? ?Tobacco Use  ? Smoking status: Former  ?  Packs/day: 1.00  ?  Years: 10.00  ?  Pack years: 10.00  ?  Types: Cigarettes  ?  Quit date: 10/08/1990  ?  Years since quitting: 30.9  ? Smokeless tobacco: Never  ?Substance Use Topics  ? Alcohol use: Yes  ?  Alcohol/week: 1.0 - 2.0 standard drink  ?  Types: 1 - 2 Standard drinks or equivalent per week  ?  Comment: occas.  ?  ? ?Review of Systems ?Constitutional: negative ?Respiratory: negative ?Cardiovascular: negative ?Gastrointestinal: negative ?Musculoskeletal:positive for hip pain ?Neurological: negative ? ?Physical Exam: ? ?General: alert and no distress ?HENT:Head: Normocephalic, no lesions, without obvious abnormality. ?Neck:supple ? ?Musculoskeletal: ?Left lower extremity shortened and internally rotated. ?Distal pulse 2+ ?No open lesions about the hip. ?Sensation and motor function intact.  ?Plantar/dorsiflexion intact. Able to wiggle toes. ? ?LABS: ?Results for orders placed or performed during the hospital encounter of 09/12/21  ?Surgical pcr screen  ? Specimen: Nasal Mucosa; Nasal Swab  ?Result Value  Ref Range  ? MRSA, PCR NEGATIVE NEGATIVE  ? Staphylococcus aureus NEGATIVE NEGATIVE  ?CBC with Differential  ?Result Value Ref Range  ? WBC 50.9 (HH) 4.0 - 10.5 K/uL  ? RBC 3.14 (L) 4.22 - 5.81 MIL/uL  ? Hemoglobin 9.8 (L) 13.0 - 17.0 g/dL  ? HCT 30.1 (L) 39.0 - 52.0 %  ? MCV 95.9 80.0 - 100.0 fL  ? MCH 31.2 26.0 - 34.0 pg  ? MCHC 32.6 30.0 - 36.0 g/dL  ? RDW 13.9 11.5 - 15.5 %  ? Platelets 163 150 - 400 K/uL  ? nRBC 0.0 0.0 - 0.2 %  ? Neutrophils Relative % 5 %  ? Neutro Abs 2.5 1.7 - 7.7 K/uL  ? Lymphocytes Relative 94 %  ? Lymphs Abs 48.0 (H) 0.7  - 4.0 K/uL  ? Monocytes Relative 1 %  ? Monocytes Absolute 0.2 0.1 - 1.0 K/uL  ? Eosinophils Relative 0 %  ? Eosinophils Absolute 0.0 0.0 - 0.5 K/uL  ? Basophils Relative 0 %  ? Basophils Absolute 0.1 0.0 - 0.1 K/uL  ? WBC Morphology ABSOLUTE LYMPHOCYTOSIS   ? Immature Granulocytes 0 %  ? Abs Immature Granulocytes 0.03 0.00 - 0.07 K/uL  ? Reactive, Benign Lymphocytes PRESENT   ? Polychromasia PRESENT   ?Basic metabolic panel  ?Result Value Ref Range  ? Sodium 137 135 - 145 mmol/L  ? Potassium 4.5 3.5 - 5.1 mmol/L  ? Chloride 101 98 - 111 mmol/L  ? CO2 28 22 - 32 mmol/L  ? Glucose, Bld 125 (H) 70 - 99 mg/dL  ? BUN 26 (H) 8 - 23 mg/dL  ? Creatinine, Ser 1.10 0.61 - 1.24 mg/dL  ? Calcium 8.4 (L) 8.9 - 10.3 mg/dL  ? GFR, Estimated >60 >60 mL/min  ? Anion gap 8 5 - 15  ?HIV Antibody (routine testing w rflx)  ?Result Value Ref Range  ? HIV Screen 4th Generation wRfx Non Reactive Non Reactive  ?Type and screen [place]  ?Result Value Ref Range  ? ABO/RH(D) A POS   ? Antibody Screen NEG   ? Sample Expiration    ?  09/15/2021,2359 ?Performed at [facility], [place] [address], [place], [place] [address] ?  ?ABO/Rh  ?Result Value Ref Range  ? ABO/RH(D)    ?  A POS ?Performed at [facility], [place] [address] [place], [place] [address] ?  ? ?Recent Labs  ?  09/12/21 ?2341  ?HGB 9.8*  ? ?Recent Labs  ?  09/12/21 ?2341  ?WBC 50.9*  ?RBC 3.14*  ?HCT 30.1*  ?PLT 163  ? ?Recent Labs  ?  09/12/21 ?2341  ?NA 137  ?K 4.5  ?CL 101  ?CO2 28  ?BUN 26*  ?CREATININE 1.10  ?GLUCOSE 125*  ?CALCIUM 8.4*  ? ? ?Assessment/Plan: ?Displaced left femoral neck fracture ? ?Patient has been admitted to [facility] for management of left femoral neck fracture. Orthopedics consulted for operative management. Will plan for OR tomorrow afternoon following regular surgery schedule. Left total hip arthroplasty will be performed by Dione Plover. Aluisio, MD. ? ?NPO at midnight, regular diet today.   ?Continued PO oxycodone and tramadol that patient was using s/p right TKA (does not tolerate IV morphine well). ?Was on 10 mg Xarelto QD at home for DVT prophylaxis (right TKA). Hold for now. Lovenox ordered for today x 1 dose.  ? ?NWB to the left lower extremity.  ? ?Plan for left total hip arthroplasty (  anterior approach). Preop orders signed and held.  ? ? ?Theresa Duty, PA-C ?Orthopedic Surgery ?[facility] ? ? ? ?

## 2021-09-13 NOTE — Plan of Care (Signed)

## 2021-09-13 NOTE — Progress Notes (Signed)
Assisted Dr. Candida Peeling with Left Femoral Nerve block. Side rails up, monitors on throughout procedure. See vital signs in flow sheet. Tolerated Procedure well. No sedation given. ? ?

## 2021-09-13 NOTE — Assessment & Plan Note (Signed)
Accidental fall while working with therapy at nursing home.  No prodromes.  Did not hit his head.  No LOC. ?Currently stable at rest.  Fall precaution after surgery ?

## 2021-09-13 NOTE — Progress Notes (Signed)
RLE venous duplex has been completed. ? ?Results can be found under chart review under CV PROC. ?09/13/2021 11:02 AM ?Ronell Boldin RVT, RDMS ? ?

## 2021-09-13 NOTE — Plan of Care (Signed)
?  Problem: Education: ?Goal: Knowledge of General Education information will improve ?Description: Including pain rating scale, medication(s)/side effects and non-pharmacologic comfort measures ?Outcome: Progressing ?  ?Problem: Safety: ?Goal: Ability to remain free from injury will improve ?Outcome: Progressing ?  ?Problem: Skin Integrity: ?Goal: Risk for impaired skin integrity will decrease ?Outcome: Progressing ?  ?Problem: Education: ?Goal: Verbalization of understanding the information provided (i.e., activity precautions, restrictions, etc) will improve ?Outcome: Progressing ?  ?Problem: Pain Management: ?Goal: Pain level will decrease ?Outcome: Progressing ?  ?

## 2021-09-13 NOTE — Progress Notes (Addendum)
?PROGRESS NOTE ? ?Frederick Pham IHK:[id number] DOB: 1951-02-10  ? ?PCP: Yvonna Alanis, NP ? ?Patient is from: SNF ? ?DOA: 09/12/2021 LOS: 1 ? ?Chief complaints ?Chief Complaint  ?Patient presents with  ? Hip Injury  ?  ? ?Brief Narrative / Interim history: ?71 year old M with PMH of CLL and recent right TKA for osteoarthritis returning from rehab with left hip fracture after accidental fall while working with therapy at [facility].  Orthopedic surgery consulted.  Plan for surgery on 4/5.  ? ?Subjective: ?Seen and examined earlier this morning.  No major events overnight of this morning.  No complaints.  Pain fairly controlled.  He rates his pain 4/10.  Pain is worse with movement.  Denies chest pain, dyspnea, dizziness, GI or UTI symptoms. ? ?Objective: ?Vitals:  ? 09/13/21 1029 09/13/21 1034 09/13/21 1039 09/13/21 1044  ?BP: (!) 141/66 (!) 141/68 136/67   ?Pulse: 80 86 80 83  ?Resp: '11 14 12 13  '$ ?Temp:      ?TempSrc:      ?SpO2: 99% 100% 100% 100%  ?Weight:      ?Height:      ? ? ?Examination: ? ?GENERAL: No apparent distress.  Nontoxic. ?HEENT: MMM.  Vision and hearing grossly intact.  ?NECK: Supple.  No apparent JVD.  ?RESP:  No IWOB.  Fair aeration bilaterally. ?CVS:  RRR. Heart sounds normal.  ?ABD/GI/GU: BS+. Abd soft, NTND.  ?MSK/EXT: Dressing over right knee DCI.  Skin bruising in medial thigh and around medial ankle.  Significant RLE swelling/edema. ?SKIN: As above. ?NEURO: Awake, alert and oriented appropriately.  No apparent focal neuro deficit. ?PSYCH: Calm. Normal affect.  ? ?Procedures:  ?None ? ?Microbiology summarized: ?MRSA PCR screen negative. ? ?Assessment and Plan: ?* Closed left hip fracture, initial encounter ([place]) ?-Hip fx pathway-strict bedrest pending surgery. ?-Plan for surgical repair 4/5. ?-Pain control and DVT prophylaxis per surgery. ?-PT/OT after surgery ? ?Fall at nursing home ?Accidental fall while working with therapy at nursing home.  No prodromes.  Did not hit his head.  No  LOC. ?Currently stable at rest.  Fall precaution after surgery ? 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  Venous Doppler negative for DVT.  ?Per orthopedic surgery. ? ?Chronic lymphocytic leukemia, Rai stage I ([place]) ? Followed by Dr. Lorenso Courier.  WBC elevated to 50,000 (baseline). ?-Plan was continued observation per oncology note on 08/24/2021. ? ?Swelling of right lower extremity ?Likely from his recent TKA.  Lower extremity Doppler negative for DVT. ?-Continue SCD and leg elevation ? ?Anemia of chronic disease ?Recent Labs  ?  01/21/21 ?1330 02/08/21 ?1014 02/18/21 ?1350 05/26/21 ?1000 08/24/21 ?0951 09/05/21 ?0850 09/06/21 ?0302 09/12/21 ?2341  ?HGB 14.2 14.1 14.0 13.3 13.6 13.4 10.9* 9.8*  ?He might have some blood loss from recent right TKA.  He has swelling and bruising in right leg. ?-Monitor H&H ? ? ? ?Body mass index is 24.27 kg/m?. ?  ?  ?  ?  ?DVT prophylaxis:  ?SCDs Start: 09/12/21 2336 ? ?Code Status: Full code ?Family Communication: Updated patient's wife at bedside. ?Level of care: Med-Surg ?Status is: Inpatient ?Remains inpatient appropriate because: Left hip fracture requiring surgical repair ? ? ?Final disposition: TBD ? ?Consultants:  ?Orthopedic surgery ? ?Sch Meds:  ?Scheduled Meds: ? fentaNYL (SUBLIMAZE) injection  50-100 mcg Intravenous UD  ? midazolam  1-2 mg Intravenous UD  ? ?Continuous Infusions: ? lactated ringers Stopped (09/13/21 1040)  ? methocarbamol (ROBAXIN) IV    ? ?  PRN Meds:.methocarbamol (ROBAXIN) IV, methocarbamol, morphine injection, oxyCODONE, traMADol ? ?Antimicrobials: ?Anti-infectives (From admission, onward)  ? ? None  ? ?  ? ? ? ?I have personally reviewed the following labs and images: ?CBC: ?Recent Labs  ?Lab 09/12/21 ?2341  ?WBC 50.9*  ?NEUTROABS 2.5  ?HGB 9.8*  ?HCT 30.1*  ?MCV 95.9  ?PLT 163  ? ?BMP &GFR ?Recent Labs  ?Lab 09/12/21 ?2341  ?NA 137  ?K 4.5  ?CL 101  ?CO2 28  ?GLUCOSE 125*  ?BUN 26*  ?CREATININE 1.10   ?CALCIUM 8.4*  ? ?Estimated Creatinine Clearance: 56.4 mL/min (by C-G formula based on SCr of 1.1 mg/dL). ?Liver & Pancreas: ?No results for input(s): AST, ALT, ALKPHOS, BILITOT, PROT, ALBUMIN in the last 168 hours. ?No results for input(s): LIPASE, AMYLASE in the last 168 hours. ?No results for input(s): AMMONIA in the last 168 hours. ?Diabetic: ?No results for input(s): HGBA1C in the last 72 hours. ?No results for input(s): GLUCAP in the last 168 hours. ?Cardiac Enzymes: ?No results for input(s): CKTOTAL, CKMB, CKMBINDEX, TROPONINI in the last 168 hours. ?No results for input(s): PROBNP in the last 8760 hours. ?Coagulation Profile: ?No results for input(s): INR, PROTIME in the last 168 hours. ?Thyroid Function Tests: ?No results for input(s): TSH, T4TOTAL, FREET4, T3FREE, THYROIDAB in the last 72 hours. ?Lipid Profile: ?No results for input(s): CHOL, HDL, LDLCALC, TRIG, CHOLHDL, LDLDIRECT in the last 72 hours. ?Anemia Panel: ?No results for input(s): VITAMINB12, FOLATE, FERRITIN, TIBC, IRON, RETICCTPCT in the last 72 hours. ?Urine analysis: ?No results found for: COLORURINE, APPEARANCEUR, [place], [place], [place], [place], [place], KETONESUR, PROTEINUR, [place], NITRITE, LEUKOCYTESUR ?Sepsis Labs: ?Invalid input(s): PROCALCITONIN, LACTICIDVEN ? ?Microbiology: ?Recent Results (from the past 240 hour(s))  ?Surgical pcr screen     Status: None  ? Collection Time: 09/13/21  6:17 AM  ? Specimen: Nasal Mucosa; Nasal Swab  ?Result Value Ref Range Status  ? MRSA, PCR NEGATIVE NEGATIVE Final  ? Staphylococcus aureus NEGATIVE NEGATIVE Final  ?  Comment: (NOTE) ?The Xpert SA Assay (FDA approved for NASAL specimens in patients 30 ?years of age and older), is one component of a comprehensive ?surveillance program. It is not intended to diagnose infection nor to ?guide or monitor treatment. ?Performed at [facility], [address] ?[place], [place] [address] ?  ? ? ?Radiology Studies: ?DG Chest 1  View ? ?Result Date: 09/13/2021 ?CLINICAL DATA:  Fall with left hip pain EXAM: CHEST  1 VIEW COMPARISON:  CT 02/18/2021 FINDINGS: Low lung volumes. No focal opacity, pleural effusion or pneumothorax. Normal cardiomediastinal silhouette. IMPRESSION: No active disease. Electronically Signed   By: Donavan Foil M.D.   On: 09/13/2021 00:06  ? ?DG Hip Unilat W or Wo Pelvis 2-3 Views Left ? ?Result Date: 09/13/2021 ?CLINICAL DATA:  Fall EXAM: DG HIP (WITH OR WITHOUT PELVIS) 2-3V LEFT COMPARISON:  None. FINDINGS: Acute fracture involving the left femoral neck with cranial migration of the trochanter. No femoral head dislocation. Pubic symphysis and rami appear intact IMPRESSION: Acute displaced left femoral neck fracture Electronically Signed   By: Donavan Foil M.D.   On: 09/13/2021 00:06  ? ?VAS [place] LOWER EXTREMITY VENOUS (DVT) ? ?Result Date: 09/13/2021 ? Lower Venous DVT Study Patient Name:  YASMIN DIBELLO  Date of Exam:   09/13/2021 Medical Rec #: [id number]        Accession #:    [id number] Date of Birth: 1951-05-26       Patient Gender: M Patient Age:   72  years Exam Location:  [facility] Procedure:      VAS [place] LOWER EXTREMITY VENOUS (DVT) Referring Phys: Bretta Bang Pilar Westergaard --------------------------------------------------------------------------------  Indications: Swelling.  Risk Factors: Right TKA on 09/05/21. Fall on 09/12/21 resulting in left hip fracture. Anticoagulation: Xarelto (post op TKA). Limitations: Poor ultrasound/tissue interface and patient immobility. Comparison Study: No previous exams Performing Technologist: Jody Hill RVT, RDMS  Examination Guidelines: A complete evaluation includes B-mode imaging, spectral Doppler, color Doppler, and power Doppler as needed of all accessible portions of each vessel. Bilateral testing is considered an integral part of a complete examination. Limited examinations for reoccurring indications may be performed as noted. The reflux portion of the exam is performed with the  patient in reverse Trendelenburg.  +--------+---------------+---------+-----------+----------+--------------------+ RIGHT   CompressibilityPhasicitySpontaneityPropertiesThrombus Aging       +--------+---------------+--

## 2021-09-13 NOTE — Assessment & Plan Note (Signed)
Recent Labs  ?  01/21/21 ?1330 02/08/21 ?1014 02/18/21 ?1350 05/26/21 ?1000 08/24/21 ?0951 09/05/21 ?0850 09/06/21 ?0302 09/12/21 ?2341  ?HGB 14.2 14.1 14.0 13.3 13.6 13.4 10.9* 9.8*  ?He might have some blood loss from recent right TKA.  He has swelling and bruising in right leg. ?-Monitor H&H ? ?

## 2021-09-13 NOTE — Telephone Encounter (Signed)
Frederick Pham, wife, called requesting to speak with you Directly. Stated that they have had a week FULL of hard things/decisions that she needs to speak with you directly about.  ? ?Stated that patient is currently in [place] with a Broken Hip a week out from having Total Knee Replacement.  ? ?Requesting to speak with you directly concerning some medical issues.  ? ?Requesting for you to call her at #: [phone] ?

## 2021-09-13 NOTE — Progress Notes (Signed)
Initial Nutrition Assessment ? ?DOCUMENTATION CODES:  ? ?Not applicable ? ?INTERVENTION:  ?- will order Ensure Surgery BID, each supplement provides 330 kcal and 18 grams of protein. ?- will order 1 tablet multivitamin with minerals daily. ?- complete NFPE when feasible.  ? ? ?NUTRITION DIAGNOSIS:  ? ?Increased nutrient needs related to hip fracture, post-op healing as evidenced by estimated needs. ? ?GOAL:  ? ?Patient will meet greater than or equal to 90% of their needs ? ?MONITOR:  ? ?PO intake, Supplement acceptance, Labs, Weight trends ? ?REASON FOR ASSESSMENT:  ? ?Consult ?Hip fracture protocol ? ?ASSESSMENT:  ? ?71 year old M with medical history of arthritis, CLL, and recent right TKA for osteoarthritis returning from rehab with L hip fracture after accidental fall while working with therapy at [facility].  Orthopedic surgery consulted.  Plan for surgery on 4/5. ? ?Patient laying in bed with daughter at bedside. Flow sheet documentation indicates that patient is a/o to self and place. Daughter shares that patient's wife ran an errand and will be back. ? ?Patient did have breakfast this AM but he is unable to recall what he ate. He shares that he ate something out of a bowl the last time he ate PTA. He is unable to recall what it was.  ? ?Daughter shares that in the 1-2 days post-op last week patient's appetite and intakes were decreased but that they had started to slightly improve. ? ?He has not been seen by a [place] RD at any time in the past. ? ?Weight  today is 150 lb and weight is significant up from all weights recorded over the past 8 months. Weight on 08/24/21 was 140 lb. ? ?Mild pitting edema to RLE documented in the edema section of flow sheet.  ? ? ?Labs reviewed; BUN: 26 mg/dl, Ca: 8.4 mg/dl. ?Medications reviewed.  ?  ? ?NUTRITION - FOCUSED PHYSICAL EXAM: ? ?Patient declined. ? ?Diet Order:   ?Diet Order   ? ?       ?  Diet regular Room service appropriate? Yes; Fluid consistency: Thin  Diet  effective now       ?  ? ?  ?  ? ?  ? ? ?EDUCATION NEEDS:  ? ?No education needs have been identified at this time ? ?Skin:  Skin Assessment: Skin Integrity Issues: ?Skin Integrity Issues:: Incisions ?Incisions: R knee (3/27) ? ?Last BM:  PTA/unknown ? ?Height:  ? ?Ht Readings from Last 1 Encounters:  ?09/13/21 '5\' 6"'$  (1.676 m)  ? ? ?Weight:  ? ?Wt Readings from Last 1 Encounters:  ?09/13/21 68.2 kg  ? ? ? ?BMI:  Body mass index is 24.27 kg/m?. ? ?Estimated Nutritional Needs:  ?Kcal:  1700-1900 kcal ?Protein:  80-95 grams ?Fluid:  >/= 1.8 L/day ? ? ? ? ?Jarome Matin, MS, RD, LDN ?Registered Dietitian II ?Inpatient Clinical Nutrition ?RD pager # and on-call/weekend pager # available in [place]  ? ?

## 2021-09-13 NOTE — H&P (Signed)
?History and Physical  ? ? ?Patient: Frederick Pham HKV:[id number] DOB: [birth year] ?DOA: 09/12/2021 ?DOS: the patient was seen and examined on 09/13/2021 ?PCP: Yvonna Alanis, NP  ?Patient coming from: Outside Hospital ? ?Chief Complaint:  ?Chief Complaint  ?Patient presents with  ? Hip Injury  ? ?HPI: Frederick Pham is a 71 y.o. male with medical history significant of CLL followed by oncology, currently just on observation only for this. ? ?Pt just had TKA of R knee on 3/27.  He was put on xarelto for DVT ppx and discharged, was undergoing rehab. ? ?Today at rehab, had mechanical fall, Injury to L hip.  Went to OSH where X ray demonstrated L hip fx. ? ?[facility] (who had just performed his TKA a couple of days ago) was called and pt transferred to [facility] ED to ED.  Hospitalist has now been asked to admit patient. ?Review of Systems: As mentioned in the history of present illness. All other systems reviewed and are negative. ?Past Medical History:  ?Diagnosis Date  ? Arthritis   ? CLL (chronic lymphocytic leukemia) ([place])   ? ?Past Surgical History:  ?Procedure Laterality Date  ? FRACTURE SURGERY    ? age 61  ? TONSILLECTOMY    ? age 61  ? TOTAL KNEE ARTHROPLASTY Right 09/05/2021  ? Procedure: TOTAL KNEE ARTHROPLASTY;  Surgeon: Gaynelle Arabian, MD;  Location: [facility];  Service: Orthopedics;  Laterality: Right;  ? WISDOM TOOTH EXTRACTION    ? ?Social History:  reports that he quit smoking about 30 years ago. His smoking use included cigarettes. He has a 10.00 pack-year smoking history. He has never used smokeless tobacco. He reports current alcohol use of about 1.0 - 2.0 standard drink per week. He reports that he does not use drugs. ? ?No Known Allergies ? ?Family History  ?Adopted: Yes  ? ? ?Prior to Admission medications   ?Medication Sig Start Date End Date Taking? Authorizing Provider  ?gabapentin (NEURONTIN) 300 MG capsule Take a 300 mg capsule three times a day for two weeks following surgery.Then take a 300 mg capsule  two times a day for two weeks. Then take a 300 mg capsule once a day for two weeks. Then discontinue. 09/06/21   Edmisten, Ok Anis, PA  ?methocarbamol (ROBAXIN) 500 MG tablet Take 1 tablet (500 mg total) by mouth every 6 (six) hours as needed for muscle spasms. 09/06/21   Edmisten, Ok Anis, PA  ?oxyCODONE (OXY IR/ROXICODONE) 5 MG immediate release tablet Take 1-2 tablets (5-10 mg total) by mouth every 6 (six) hours as needed for severe pain. 09/06/21   Edmisten, Ok Anis, PA  ?rivaroxaban (XARELTO) 10 MG TABS tablet Take 1 tablet (10 mg total) by mouth daily with breakfast for 20 days. Then take one 81 mg aspirin once a day for three weeks. Then discontinue aspirin. 09/06/21 09/26/21  Edmisten, Ok Anis, PA  ?traMADol (ULTRAM) 50 MG tablet Take 1-2 tablets (50-100 mg total) by mouth every 6 (six) hours as needed for moderate pain. 09/06/21   Edmisten, Ok Anis, PA  ? ? ?Physical Exam: ?Vitals:  ? 09/12/21 2303 09/13/21 0000  ?BP: (!) 148/80 114/60  ?Pulse: 98 84  ?Resp: 16 11  ?Temp: 98.8 ?F (37.1 ?C)   ?TempSrc: Oral   ?SpO2: 96% 95%  ? ?Constitutional: NAD, calm, comfortable ?Eyes: PERRL, lids and conjunctivae normal ?ENMT: Mucous membranes are moist. Posterior pharynx clear of any exudate or lesions.Normal dentition.  ?Neck: normal, supple, no masses, no thyromegaly ?  Respiratory: clear to auscultation bilaterally, no wheezing, no crackles. Normal respiratory effort. No accessory muscle use.  ?Cardiovascular: Regular rate and rhythm, no murmurs / rubs / gallops. No extremity edema. 2+ pedal pulses. No carotid bruits.  ?Abdomen: no tenderness, no masses palpated. No hepatosplenomegaly. Bowel sounds positive.  ?Musculoskeletal: Ecchymosis of RLE with bandage over R anterior knee, external rotation of LLE with pain on palpation. ?Skin: no rashes, lesions, ulcers. No induration ?Neurologic: CN 2-12 grossly intact. Sensation intact, DTR normal. Strength 5/5 in all 4.  ?Psychiatric: Normal judgment and insight. Alert  and oriented x 3. Normal mood.  ? ?Data Reviewed: ? ?Results are pending, will review when available. ? ?Assessment and Plan: ?* Closed left hip fracture, initial encounter ([place]) ?Hip fx pathway ?Pain control including IV narcotics per pathway ?Pre-op work up including CBC, CMP, CXR, EKG, all pending ?Ortho consulted, presumably pt to go to OR in next day or two for hip fx repair. ?Did take Xarelto in AM of 09/12/2021 for DVT ppx (following R TKA) ?NPO after MN - ortho to decide on timing of OR repair. ? ?Chronic pain of right knee ?Just had R TKA a couple of days ago. ?Hold Xarelto in setting of new hip fx. ? ?Lymphocytosis ?CLL, chronic and longstanding. ?Current treatment = observation only, not on any sort of chemo.  See also Onc office notes. ?WBC 50.9k today is CW his baseline. ? ? ? ? ? Advance Care Planning:   Code Status: Full Code ? ?Consults: [facility] ? ?Family Communication: Wife at bedside ? ?Severity of Illness: ?The appropriate patient status for this patient is INPATIENT. Inpatient status is judged to be reasonable and necessary in order to provide the required intensity of service to ensure the patient's safety. The patient's presenting symptoms, physical exam findings, and initial radiographic and laboratory data in the context of their chronic comorbidities is felt to place them at high risk for further clinical deterioration. Furthermore, it is not anticipated that the patient will be medically stable for discharge from the hospital within 2 midnights of admission.  ? ?* I certify that at the point of admission it is my clinical judgment that the patient will require inpatient hospital care spanning beyond 2 midnights from the point of admission due to high intensity of service, high risk for further deterioration and high frequency of surveillance required.* ? ?Author: ?Etta Quill., DO ?09/13/2021 12:26 AM ? ?For on call review [email or web address].  ?

## 2021-09-13 NOTE — Assessment & Plan Note (Signed)
Likely from his recent TKA.  Lower extremity Doppler negative for DVT. ?-Continue SCD and leg elevation ?

## 2021-09-13 NOTE — Hospital Course (Signed)
71 year old M with PMH of CLL and recent right TKA for osteoarthritis returning from rehab with left hip fracture after accidental fall while working with therapy at [facility].  Orthopedic surgery consulted.  Plan for surgery on 4/5. ?

## 2021-09-13 NOTE — H&P (View-Only) (Signed)
? ? ?Patient ID: ?Frederick Pham ?MRN: [id number] ?DOB/AGE: 1950/12/04 71 y.o. ? ?Admit date: 09/12/2021 ? ?Chief Complaint: ?Left hip pain. ? ?Subjective: ?Frederick Pham, 71 y/o male, PMH significant for right TKA on 09/05/2021 and active CLL presented to the ED at [facility] due to left hip pain following a fall at home. Patient was working on exercises for his right knee, when he lost his balance and fell directly onto his left side. Had immediate severe pain and was unable to bear weight. Workup found displaced closed left femoral neck fracture. Patient transferred to [facility] for further management and was admitted. Orthopedics consulted last night.  ? ?Allergies: ?No Known Allergies  ? ?Medications: ?Medications Prior to Admission  ?Medication Sig Dispense Refill Last Dose  ? gabapentin (NEURONTIN) 300 MG capsule Take a 300 mg capsule three times a day for two weeks following surgery.Then take a 300 mg capsule two times a day for two weeks. Then take a 300 mg capsule once a day for two weeks. Then discontinue. 84 capsule 0   ? methocarbamol (ROBAXIN) 500 MG tablet Take 1 tablet (500 mg total) by mouth every 6 (six) hours as needed for muscle spasms. 40 tablet 0   ? oxyCODONE (OXY IR/ROXICODONE) 5 MG immediate release tablet Take 1-2 tablets (5-10 mg total) by mouth every 6 (six) hours as needed for severe pain. 42 tablet 0   ? rivaroxaban (XARELTO) 10 MG TABS tablet Take 1 tablet (10 mg total) by mouth daily with breakfast for 20 days. Then take one 81 mg aspirin once a day for three weeks. Then discontinue aspirin. 20 tablet 0   ? traMADol (ULTRAM) 50 MG tablet Take 1-2 tablets (50-100 mg total) by mouth every 6 (six) hours as needed for moderate pain. 40 tablet 0   ? ? ?Past Medical History: ?Past Medical History:  ?Diagnosis Date  ? Arthritis   ? CLL (chronic lymphocytic leukemia) ([place])   ?  ? ?Past Surgical History: ?Past Surgical History:  ?Procedure Laterality Date  ? FRACTURE SURGERY    ? age 10  ?  TONSILLECTOMY    ? age 22  ? TOTAL KNEE ARTHROPLASTY Right 09/05/2021  ? Procedure: TOTAL KNEE ARTHROPLASTY;  Surgeon: Gaynelle Arabian, MD;  Location: [facility];  Service: Orthopedics;  Laterality: Right;  ? WISDOM TOOTH EXTRACTION    ?  ? ?Family History: ?Family History  ?Adopted: Yes  ? ? ?Social History: ?Social History  ? ?Tobacco Use  ? Smoking status: Former  ?  Packs/day: 1.00  ?  Years: 10.00  ?  Pack years: 10.00  ?  Types: Cigarettes  ?  Quit date: 10/08/1990  ?  Years since quitting: 30.9  ? Smokeless tobacco: Never  ?Substance Use Topics  ? Alcohol use: Yes  ?  Alcohol/week: 1.0 - 2.0 standard drink  ?  Types: 1 - 2 Standard drinks or equivalent per week  ?  Comment: occas.  ?  ? ?Review of Systems ?Constitutional: negative ?Respiratory: negative ?Cardiovascular: negative ?Gastrointestinal: negative ?Musculoskeletal:positive for hip pain ?Neurological: negative ? ?Physical Exam: ? ?General: alert and no distress ?HENT:Head: Normocephalic, no lesions, without obvious abnormality. ?Neck:supple ? ?Musculoskeletal: ?Left lower extremity shortened and internally rotated. ?Distal pulse 2+ ?No open lesions about the hip. ?Sensation and motor function intact.  ?Plantar/dorsiflexion intact. Able to wiggle toes. ? ?LABS: ?Results for orders placed or performed during the hospital encounter of 09/12/21  ?Surgical pcr screen  ? Specimen: Nasal Mucosa; Nasal Swab  ?Result Value  Ref Range  ? MRSA, PCR NEGATIVE NEGATIVE  ? Staphylococcus aureus NEGATIVE NEGATIVE  ?CBC with Differential  ?Result Value Ref Range  ? WBC 50.9 (HH) 4.0 - 10.5 K/uL  ? RBC 3.14 (L) 4.22 - 5.81 MIL/uL  ? Hemoglobin 9.8 (L) 13.0 - 17.0 g/dL  ? HCT 30.1 (L) 39.0 - 52.0 %  ? MCV 95.9 80.0 - 100.0 fL  ? MCH 31.2 26.0 - 34.0 pg  ? MCHC 32.6 30.0 - 36.0 g/dL  ? RDW 13.9 11.5 - 15.5 %  ? Platelets 163 150 - 400 K/uL  ? nRBC 0.0 0.0 - 0.2 %  ? Neutrophils Relative % 5 %  ? Neutro Abs 2.5 1.7 - 7.7 K/uL  ? Lymphocytes Relative 94 %  ? Lymphs Abs 48.0 (H) 0.7  - 4.0 K/uL  ? Monocytes Relative 1 %  ? Monocytes Absolute 0.2 0.1 - 1.0 K/uL  ? Eosinophils Relative 0 %  ? Eosinophils Absolute 0.0 0.0 - 0.5 K/uL  ? Basophils Relative 0 %  ? Basophils Absolute 0.1 0.0 - 0.1 K/uL  ? WBC Morphology ABSOLUTE LYMPHOCYTOSIS   ? Immature Granulocytes 0 %  ? Abs Immature Granulocytes 0.03 0.00 - 0.07 K/uL  ? Reactive, Benign Lymphocytes PRESENT   ? Polychromasia PRESENT   ?Basic metabolic panel  ?Result Value Ref Range  ? Sodium 137 135 - 145 mmol/L  ? Potassium 4.5 3.5 - 5.1 mmol/L  ? Chloride 101 98 - 111 mmol/L  ? CO2 28 22 - 32 mmol/L  ? Glucose, Bld 125 (H) 70 - 99 mg/dL  ? BUN 26 (H) 8 - 23 mg/dL  ? Creatinine, Ser 1.10 0.61 - 1.24 mg/dL  ? Calcium 8.4 (L) 8.9 - 10.3 mg/dL  ? GFR, Estimated >60 >60 mL/min  ? Anion gap 8 5 - 15  ?HIV Antibody (routine testing w rflx)  ?Result Value Ref Range  ? HIV Screen 4th Generation wRfx Non Reactive Non Reactive  ?Type and screen [place]  ?Result Value Ref Range  ? ABO/RH(D) A POS   ? Antibody Screen NEG   ? Sample Expiration    ?  09/15/2021,2359 ?Performed at [facility], [place] [address] [place], [place] [address] ?  ?ABO/Rh  ?Result Value Ref Range  ? ABO/RH(D)    ?  A POS ?Performed at [facility], [place] [address], [place], [place] [address] ?  ? ?Recent Labs  ?  09/12/21 ?2341  ?HGB 9.8*  ? ?Recent Labs  ?  09/12/21 ?2341  ?WBC 50.9*  ?RBC 3.14*  ?HCT 30.1*  ?PLT 163  ? ?Recent Labs  ?  09/12/21 ?2341  ?NA 137  ?K 4.5  ?CL 101  ?CO2 28  ?BUN 26*  ?CREATININE 1.10  ?GLUCOSE 125*  ?CALCIUM 8.4*  ? ? ?Assessment/Plan: ?Displaced left femoral neck fracture ? ?Patient has been admitted to [facility] for management of left femoral neck fracture. Orthopedics consulted for operative management. Will plan for OR tomorrow afternoon following regular surgery schedule. Left total hip arthroplasty will be performed by Dione Plover. Aluisio, MD. ? ?NPO at midnight, regular diet today.   ?Continued PO oxycodone and tramadol that patient was using s/p right TKA (does not tolerate IV morphine well). ?Was on 10 mg Xarelto QD at home for DVT prophylaxis (right TKA). Hold for now. Lovenox ordered for today x 1 dose.  ? ?NWB to the left lower extremity.  ? ?Plan for left total hip arthroplasty (  anterior approach). Preop orders signed and held.  ? ? ?Theresa Duty, PA-C ?Orthopedic Surgery ?[facility] [place] ? ? ? ?

## 2021-09-13 NOTE — Anesthesia Preprocedure Evaluation (Signed)
Anesthesia Evaluation  ?Patient identified by MRN, date of birth, ID band ?Patient awake ? ? ? ?Reviewed: ?Allergy & Precautions, NPO status , Patient's Chart, lab work & pertinent test results ? ?History of Anesthesia Complications ?Negative for: history of anesthetic complications ? ?Airway ?Mallampati: II ? ?TM Distance: >3 FB ?Neck ROM: Full ? ? ? Dental ?no notable dental hx. ?(+) Dental Advisory Given ?  ?Pulmonary ?neg pulmonary ROS, former smoker,  ?  ?Pulmonary exam normal ? ? ? ? ? ? ? Cardiovascular ?negative cardio ROS ?Normal cardiovascular exam ? ? ?  ?Neuro/Psych ?negative neurological ROS ?   ? GI/Hepatic ?negative GI ROS, Neg liver ROS,   ?Endo/Other  ?negative endocrine ROS ? Renal/GU ?negative Renal ROS  ? ?  ?Musculoskeletal ? ?(+) Arthritis , Osteoarthritis,   ? Abdominal ?  ?Peds ? Hematology ? ?(+) Blood dyscrasia, , CLL   ?Anesthesia Other Findings ? ? Reproductive/Obstetrics ? ?  ? ? ? ? ? ? ? ? ? ? ? ? ? ?  ?  ? ? ? ? ? ? ? ? ?Anesthesia Physical ? ?Anesthesia Plan ? ?ASA: 3 ? ?Anesthesia Plan: Regional  ? ?Post-op Pain Management: Regional block*  ? ?Induction:  ? ?PONV Risk Score and Plan: 1 and Treatment may vary due to age or medical condition ? ?Airway Management Planned: Natural Airway and Simple Face Mask ? ?Additional Equipment:  ? ?Intra-op Plan:  ? ?Post-operative Plan:  ? ?Informed Consent: I have reviewed the patients History and Physical, chart, labs and discussed the procedure including the risks, benefits and alternatives for the proposed anesthesia with the patient or authorized representative who has indicated his/her understanding and acceptance.  ? ? ? ?Dental advisory given ? ?Plan Discussed with: Anesthesiologist and CRNA ? ?Anesthesia Plan Comments:   ? ? ? ? ? ? ?Anesthesia Quick Evaluation ? ?

## 2021-09-13 NOTE — Assessment & Plan Note (Deleted)
WBC elevated to 50,000 with lymphocytosis.  Chronic.  Not on medication.  Just monitoring. ? ?

## 2021-09-13 NOTE — Anesthesia Procedure Notes (Signed)
Anesthesia Regional Block: Femoral nerve block  ? ?Pre-Anesthetic Checklist: , timeout performed,  Correct Patient, Correct Site, Correct Laterality,  Correct Procedure, Correct Position, site marked,  Risks and benefits discussed,  Surgical consent,  Pre-op evaluation,  At surgeon's request and post-op pain management ? ?Laterality: Left ? ?Prep: chloraprep     ?  ?Needles:  ?Injection technique: Single-shot ? ?Needle Type: Stimiplex   ? ? ?Needle Length: 9cm  ?Needle Gauge: 21  ? ? ? ?Additional Needles: ? ? ?Narrative:  ?Start time: 09/13/2021 10:32 AM ?End time: 09/13/2021 10:37 AM ?Injection made incrementally with aspirations every 5 mL. ? ?Performed by: Personally  ?Anesthesiologist: Lynda Rainwater, MD ? ? ? ?

## 2021-09-14 ENCOUNTER — Inpatient Hospital Stay (HOSPITAL_COMMUNITY): Payer: Medicare Other | Admitting: Anesthesiology

## 2021-09-14 ENCOUNTER — Encounter (HOSPITAL_COMMUNITY)
Admission: EM | Disposition: A | Discharge status: Skilled Nursing Facility | Payer: Self-pay | Source: Skilled Nursing Facility | Attending: Internal Medicine

## 2021-09-14 ENCOUNTER — Other Ambulatory Visit: Payer: Self-pay

## 2021-09-14 ENCOUNTER — Encounter (HOSPITAL_COMMUNITY): Payer: Self-pay | Admitting: Internal Medicine

## 2021-09-14 ENCOUNTER — Inpatient Hospital Stay (HOSPITAL_COMMUNITY): Payer: Medicare Other

## 2021-09-14 DIAGNOSIS — S72002A Fracture of unspecified part of neck of left femur, initial encounter for closed fracture: Secondary | ICD-10-CM | POA: Diagnosis not present

## 2021-09-14 DIAGNOSIS — D638 Anemia in other chronic diseases classified elsewhere: Secondary | ICD-10-CM | POA: Diagnosis not present

## 2021-09-14 DIAGNOSIS — R410 Disorientation, unspecified: Secondary | ICD-10-CM | POA: Diagnosis not present

## 2021-09-14 DIAGNOSIS — M1990 Unspecified osteoarthritis, unspecified site: Secondary | ICD-10-CM

## 2021-09-14 DIAGNOSIS — D72829 Elevated white blood cell count, unspecified: Secondary | ICD-10-CM

## 2021-09-14 DIAGNOSIS — C9110 Chronic lymphocytic leukemia of B-cell type not having achieved remission: Secondary | ICD-10-CM | POA: Diagnosis not present

## 2021-09-14 HISTORY — DX: Disorientation, unspecified: R41.0

## 2021-09-14 HISTORY — PX: TOTAL HIP ARTHROPLASTY: SHX124

## 2021-09-14 HISTORY — DX: Elevated white blood cell count, unspecified: D72.829

## 2021-09-14 SURGERY — ARTHROPLASTY, HIP, TOTAL, ANTERIOR APPROACH
Anesthesia: Spinal | Site: Hip | Laterality: Left

## 2021-09-14 MED ORDER — ONDANSETRON HCL 4 MG/2ML IJ SOLN
INTRAMUSCULAR | Status: AC
  Filled 2021-09-14: qty 2

## 2021-09-14 MED ORDER — PHENYLEPHRINE HCL-NACL 20-0.9 MG/250ML-% IV SOLN
INTRAVENOUS | Status: DC | PRN
  Administered 2021-09-14: 25 ug/min via INTRAVENOUS

## 2021-09-14 MED ORDER — BUPIVACAINE IN DEXTROSE 0.75-8.25 % IT SOLN
INTRATHECAL | Status: DC | PRN
  Administered 2021-09-14: 1.6 mL via INTRATHECAL

## 2021-09-14 MED ORDER — WATER FOR IRRIGATION, STERILE IR SOLN
Status: DC | PRN
  Administered 2021-09-14: 2000 mL

## 2021-09-14 MED ORDER — SODIUM CHLORIDE 0.9 % IV SOLN: INTRAVENOUS | Status: DC

## 2021-09-14 MED ORDER — FENTANYL CITRATE PF 50 MCG/ML IJ SOSY: 25.0000 ug | PREFILLED_SYRINGE | INTRAMUSCULAR | Status: DC | PRN

## 2021-09-14 MED ORDER — ALBUMIN HUMAN 5 % IV SOLN: INTRAVENOUS | Status: DC | PRN

## 2021-09-14 MED ORDER — ALBUMIN HUMAN 5 % IV SOLN
INTRAVENOUS | Status: AC
  Filled 2021-09-14: qty 500

## 2021-09-14 MED ORDER — PROPOFOL 10 MG/ML IV BOLUS
INTRAVENOUS | Status: AC
  Filled 2021-09-14: qty 20

## 2021-09-14 MED ORDER — CEFAZOLIN SODIUM-DEXTROSE 2-4 GM/100ML-% IV SOLN
2.0000 g | INTRAVENOUS | Status: AC
  Administered 2021-09-14: 2 g via INTRAVENOUS
  Filled 2021-09-14: qty 100

## 2021-09-14 MED ORDER — PHENYLEPHRINE HCL (PRESSORS) 10 MG/ML IV SOLN
INTRAVENOUS | Status: AC
  Filled 2021-09-14: qty 1

## 2021-09-14 MED ORDER — FENTANYL CITRATE (PF) 100 MCG/2ML IJ SOLN
INTRAMUSCULAR | Status: AC
  Filled 2021-09-14: qty 2

## 2021-09-14 MED ORDER — METOCLOPRAMIDE HCL 5 MG/ML IJ SOLN: 5.0000 mg | Freq: Three times a day (TID) | INTRAMUSCULAR | Status: DC | PRN

## 2021-09-14 MED ORDER — POVIDONE-IODINE 10 % EX SWAB
2.0000 "application " | Freq: Once | CUTANEOUS | Status: AC
  Administered 2021-09-14: 2 via TOPICAL

## 2021-09-14 MED ORDER — DEXAMETHASONE SODIUM PHOSPHATE 10 MG/ML IJ SOLN
10.0000 mg | Freq: Once | INTRAMUSCULAR | Status: AC
  Administered 2021-09-15: 10 mg via INTRAVENOUS
  Filled 2021-09-14: qty 1

## 2021-09-14 MED ORDER — ACETAMINOPHEN 325 MG PO TABS
325.0000 mg | ORAL_TABLET | Freq: Four times a day (QID) | ORAL | Status: DC | PRN
  Administered 2021-09-15 – 2021-09-17 (×4): 650 mg via ORAL
  Filled 2021-09-14 (×4): qty 2

## 2021-09-14 MED ORDER — HALOPERIDOL LACTATE 5 MG/ML IJ SOLN: 2.0000 mg | Freq: Four times a day (QID) | INTRAMUSCULAR | Status: DC | PRN

## 2021-09-14 MED ORDER — AMISULPRIDE (ANTIEMETIC) 5 MG/2ML IV SOLN: 10.0000 mg | Freq: Once | INTRAVENOUS | Status: DC | PRN

## 2021-09-14 MED ORDER — DOCUSATE SODIUM 100 MG PO CAPS
100.0000 mg | ORAL_CAPSULE | Freq: Two times a day (BID) | ORAL | Status: DC
  Administered 2021-09-14 – 2021-09-17 (×5): 100 mg via ORAL
  Filled 2021-09-14 (×5): qty 1

## 2021-09-14 MED ORDER — METOCLOPRAMIDE HCL 5 MG PO TABS: 5.0000 mg | ORAL_TABLET | Freq: Three times a day (TID) | ORAL | Status: DC | PRN

## 2021-09-14 MED ORDER — DEXAMETHASONE SODIUM PHOSPHATE 10 MG/ML IJ SOLN: 8.0000 mg | Freq: Once | INTRAMUSCULAR | Status: DC

## 2021-09-14 MED ORDER — METHOCARBAMOL 500 MG IVPB - SIMPLE MED
500.0000 mg | Freq: Four times a day (QID) | INTRAVENOUS | Status: DC | PRN
  Filled 2021-09-14: qty 50

## 2021-09-14 MED ORDER — ONDANSETRON HCL 4 MG/2ML IJ SOLN
INTRAMUSCULAR | Status: DC | PRN
  Administered 2021-09-14: 4 mg via INTRAVENOUS

## 2021-09-14 MED ORDER — POLYETHYLENE GLYCOL 3350 17 G PO PACK: 17.0000 g | PACK | Freq: Every day | ORAL | Status: DC | PRN

## 2021-09-14 MED ORDER — PROPOFOL 500 MG/50ML IV EMUL
INTRAVENOUS | Status: DC | PRN
  Administered 2021-09-14: 50 ug/kg/min via INTRAVENOUS

## 2021-09-14 MED ORDER — MIDAZOLAM HCL 2 MG/2ML IJ SOLN
INTRAMUSCULAR | Status: AC
Start: 2021-09-14 — End: ?
  Filled 2021-09-14: qty 2

## 2021-09-14 MED ORDER — MORPHINE SULFATE (PF) 2 MG/ML IV SOLN: 0.5000 mg | INTRAVENOUS | Status: DC | PRN

## 2021-09-14 MED ORDER — PROPOFOL 1000 MG/100ML IV EMUL
INTRAVENOUS | Status: AC
  Filled 2021-09-14: qty 100

## 2021-09-14 MED ORDER — DEXAMETHASONE SODIUM PHOSPHATE 10 MG/ML IJ SOLN
INTRAMUSCULAR | Status: AC
  Filled 2021-09-14: qty 1

## 2021-09-14 MED ORDER — PROPOFOL 10 MG/ML IV BOLUS
INTRAVENOUS | Status: DC | PRN
  Administered 2021-09-14: 30 mg via INTRAVENOUS
  Administered 2021-09-14: 20 mg via INTRAVENOUS

## 2021-09-14 MED ORDER — ONDANSETRON HCL 4 MG PO TABS: 4.0000 mg | ORAL_TABLET | Freq: Four times a day (QID) | ORAL | Status: DC | PRN

## 2021-09-14 MED ORDER — BUPIVACAINE-EPINEPHRINE (PF) 0.25% -1:200000 IJ SOLN
INTRAMUSCULAR | Status: AC
  Filled 2021-09-14: qty 30

## 2021-09-14 MED ORDER — LACTATED RINGERS IV SOLN: INTRAVENOUS | Status: DC

## 2021-09-14 MED ORDER — TRAMADOL HCL 50 MG PO TABS: 50.0000 mg | ORAL_TABLET | Freq: Four times a day (QID) | ORAL | Status: DC | PRN

## 2021-09-14 MED ORDER — RIVAROXABAN 10 MG PO TABS
10.0000 mg | ORAL_TABLET | Freq: Every day | ORAL | Status: DC
  Administered 2021-09-15 – 2021-09-17 (×3): 10 mg via ORAL
  Filled 2021-09-14 (×3): qty 1

## 2021-09-14 MED ORDER — CHLORHEXIDINE GLUCONATE CLOTH 2 % EX PADS
6.0000 | MEDICATED_PAD | Freq: Every day | CUTANEOUS | Status: DC
  Administered 2021-09-14 – 2021-09-16 (×3): 6 via TOPICAL

## 2021-09-14 MED ORDER — MELATONIN 3 MG PO TABS: 3.0000 mg | ORAL_TABLET | Freq: Every evening | ORAL | Status: DC | PRN

## 2021-09-14 MED ORDER — 0.9 % SODIUM CHLORIDE (POUR BTL) OPTIME
TOPICAL | Status: DC | PRN
  Administered 2021-09-14: 1000 mL

## 2021-09-14 MED ORDER — BISACODYL 10 MG RE SUPP: 10.0000 mg | Freq: Every day | RECTAL | Status: DC | PRN

## 2021-09-14 MED ORDER — HYDROCODONE-ACETAMINOPHEN 5-325 MG PO TABS: 1.0000 | ORAL_TABLET | ORAL | Status: DC | PRN

## 2021-09-14 MED ORDER — CEFAZOLIN SODIUM-DEXTROSE 2-4 GM/100ML-% IV SOLN
2.0000 g | Freq: Four times a day (QID) | INTRAVENOUS | Status: AC
  Administered 2021-09-14 – 2021-09-15 (×2): 2 g via INTRAVENOUS
  Filled 2021-09-14 (×2): qty 100

## 2021-09-14 MED ORDER — MENTHOL 3 MG MT LOZG: 1.0000 | LOZENGE | OROMUCOSAL | Status: DC | PRN

## 2021-09-14 MED ORDER — PHENOL 1.4 % MT LIQD: 1.0000 | OROMUCOSAL | Status: DC | PRN

## 2021-09-14 MED ORDER — TRANEXAMIC ACID-NACL 1000-0.7 MG/100ML-% IV SOLN
1000.0000 mg | INTRAVENOUS | Status: AC
  Administered 2021-09-14: 1000 mg via INTRAVENOUS
  Filled 2021-09-14: qty 100

## 2021-09-14 MED ORDER — DEXAMETHASONE SODIUM PHOSPHATE 10 MG/ML IJ SOLN
INTRAMUSCULAR | Status: DC | PRN
  Administered 2021-09-14: 6 mg via INTRAVENOUS

## 2021-09-14 MED ORDER — ACETAMINOPHEN 10 MG/ML IV SOLN
1000.0000 mg | Freq: Four times a day (QID) | INTRAVENOUS | Status: DC
  Administered 2021-09-14 (×2): 1000 mg via INTRAVENOUS
  Filled 2021-09-14 (×3): qty 100

## 2021-09-14 MED ORDER — ONDANSETRON HCL 4 MG/2ML IJ SOLN: 4.0000 mg | Freq: Four times a day (QID) | INTRAMUSCULAR | Status: DC | PRN

## 2021-09-14 MED ORDER — BUPIVACAINE-EPINEPHRINE (PF) 0.25% -1:200000 IJ SOLN
INTRAMUSCULAR | Status: DC | PRN
  Administered 2021-09-14: 30 mL

## 2021-09-14 MED ORDER — METHOCARBAMOL 500 MG PO TABS
500.0000 mg | ORAL_TABLET | Freq: Four times a day (QID) | ORAL | Status: DC | PRN
  Administered 2021-09-16: 500 mg via ORAL
  Filled 2021-09-14: qty 1

## 2021-09-14 SURGICAL SUPPLY — 45 items
BAG COUNTER SPONGE SURGICOUNT (BAG) IMPLANT
BAG DECANTER FOR FLEXI CONT (MISCELLANEOUS) IMPLANT
BAG ZIPLOCK 12X15 (MISCELLANEOUS) IMPLANT
BLADE SAG 18X100X1.27 (BLADE) ×2 IMPLANT
COVER PERINEAL POST (MISCELLANEOUS) ×2 IMPLANT
COVER SURGICAL LIGHT HANDLE (MISCELLANEOUS) ×2 IMPLANT
CUP ACET PINNACLE SECTR 50MM (Hips) IMPLANT
DRAPE FOOT SWITCH (DRAPES) ×2 IMPLANT
DRAPE STERI IOBAN 125X83 (DRAPES) ×2 IMPLANT
DRAPE U-SHAPE 47X51 STRL (DRAPES) ×4 IMPLANT
DRESSING AQUACEL AG SP 3.5X10 (GAUZE/BANDAGES/DRESSINGS) IMPLANT
DRSG AQUACEL AG ADV 3.5X10 (GAUZE/BANDAGES/DRESSINGS) ×2 IMPLANT
DRSG AQUACEL AG SP 3.5X10 (GAUZE/BANDAGES/DRESSINGS) ×2
DURAPREP 26ML APPLICATOR (WOUND CARE) ×2 IMPLANT
ELECT REM PT RETURN 15FT ADLT (MISCELLANEOUS) ×2 IMPLANT
GLOVE SRG 8 PF TXTR STRL LF DI (GLOVE) ×1 IMPLANT
GLOVE SURG ENC MOIS LTX SZ6.5 (GLOVE) ×2 IMPLANT
GLOVE SURG ENC MOIS LTX SZ7 (GLOVE) ×2 IMPLANT
GLOVE SURG ENC MOIS LTX SZ8 (GLOVE) ×4 IMPLANT
GLOVE SURG UNDER POLY LF SZ7 (GLOVE) ×2 IMPLANT
GLOVE SURG UNDER POLY LF SZ8 (GLOVE) ×1
GLOVE SURG UNDER POLY LF SZ8.5 (GLOVE) IMPLANT
GOWN STRL REUS W/ TWL LRG LVL3 (GOWN DISPOSABLE) ×2 IMPLANT
GOWN STRL REUS W/TWL LRG LVL3 (GOWN DISPOSABLE) ×2
HEAD FEMORAL 32 CERAMIC (Hips) ×1 IMPLANT
HOLDER FOLEY CATH W/STRAP (MISCELLANEOUS) ×2 IMPLANT
KIT TURNOVER KIT A (KITS) IMPLANT
LINER ACET PNNCL PLUS4 NEUTRAL (Hips) IMPLANT
MANIFOLD NEPTUNE II (INSTRUMENTS) ×2 IMPLANT
PACK ANTERIOR HIP CUSTOM (KITS) ×2 IMPLANT
PENCIL SMOKE EVACUATOR COATED (MISCELLANEOUS) ×2 IMPLANT
PINNACLE PLUS 4 NEUTRAL (Hips) ×2 IMPLANT
PINNACLE SECTOR CUP 50MM (Hips) ×2 IMPLANT
SPIKE FLUID TRANSFER (MISCELLANEOUS) ×2 IMPLANT
SPONGE T-LAP 18X18 ~~LOC~~+RFID (SPONGE) ×4 IMPLANT
STEM FEMORAL SZ 6MM STD ACTIS (Stem) ×1 IMPLANT
STRIP CLOSURE SKIN 1/2X4 (GAUZE/BANDAGES/DRESSINGS) ×2 IMPLANT
SUT ETHIBOND NAB CT1 #1 30IN (SUTURE) ×2 IMPLANT
SUT MNCRL AB 4-0 PS2 18 (SUTURE) ×2 IMPLANT
SUT STRATAFIX 0 PDS 27 VIOLET (SUTURE) ×2
SUT VIC AB 2-0 CT1 27 (SUTURE) ×2
SUT VIC AB 2-0 CT1 TAPERPNT 27 (SUTURE) ×2 IMPLANT
SUTURE STRATFX 0 PDS 27 VIOLET (SUTURE) ×1 IMPLANT
TRAY FOLEY MTR SLVR 16FR STAT (SET/KITS/TRAYS/PACK) ×2 IMPLANT
TUBE SUCTION HIGH CAP CLEAR NV (SUCTIONS) ×2 IMPLANT

## 2021-09-14 NOTE — Discharge Instructions (Addendum)
?Frederick Arabian, MD ?Total Joint Specialist ?[facility] ?[address] ?[place], [place] [address] ?[phone] ? ?ANTERIOR APPROACH TOTAL HIP REPLACEMENT POSTOPERATIVE DIRECTIONS ? ? ? ? ?Hip Rehabilitation, Guidelines Following Surgery  ?The results of a hip operation are greatly improved after range of motion and muscle strengthening exercises. Follow all safety measures which are given to protect your hip. If any of these exercises cause increased pain or swelling in your joint, decrease the amount until you are comfortable again. Then slowly increase the exercises. Call your caregiver if you have problems or questions.  ? ?BLOOD CLOT PREVENTION ?Take a 10 mg Xarelto once a day for three weeks following surgery. Then take an 81 mg Aspirin once a day for three weeks. Then discontinue Aspirin. ?You may resume your vitamins/supplements once you have discontinued the Xarelto. ?Do not take any NSAIDs (Advil, Aleve, Ibuprofen, Meloxicam, etc.) until you have discontinued the Xarelto.  ? ?HOME CARE INSTRUCTIONS  ?Remove items at home which could result in a fall. This includes throw rugs or furniture in walking pathways.  ?ICE to the affected hip as frequently as 20-30 minutes an hour and then as needed for pain and swelling. Continue to use ice on the hip for pain and swelling from surgery. You may notice swelling that will progress down to the foot and ankle. This is normal after surgery. Elevate the leg when you are not up walking on it.   ?Continue to use the breathing machine which will help keep your temperature down.  It is common for your temperature to cycle up and down following surgery, especially at night when you are not up moving around and exerting yourself.  The breathing machine keeps your lungs expanded and your temperature down. ? ?DIET ?You may resume your previous home diet once your are discharged from the hospital. ? ?DRESSING / WOUND CARE / SHOWERING ?You have an  adhesive waterproof bandage over the incision. Leave this in place until your first follow-up appointment. Once you remove this you will not need to place another bandage.  ?You may begin showering 3 days following surgery, but do not submerge the incision under water. ? ?ACTIVITY ?For the first 3-5 days, it is important to rest and keep the operative leg elevated. You should, as a general rule, rest for 50 minutes and walk/stretch for 10 minutes per hour. After 5 days, you may slowly increase activity as tolerated.  ?Perform the exercises you were provided twice a day for about 15-20 minutes each session. Begin these 2 days following surgery. ?Walk with your walker as instructed. Use the walker until you are comfortable transitioning to a cane. Walk with the cane in the opposite hand of the operative leg. You may discontinue the cane once you are comfortable and walking steadily. ?Avoid periods of inactivity such as sitting longer than an hour when not asleep. This helps prevent blood clots.  ?Do not drive a car for 6 weeks or until released by your surgeon.  ?Do not drive while taking narcotics. ? ?TED HOSE STOCKINGS ?Wear the elastic stockings on both legs for three weeks following surgery during the day. You may remove them at night while sleeping. ? ?WEIGHT BEARING ?Weight bearing as tolerated with assist device (walker, cane, etc) as directed, use it as long as suggested by your surgeon or therapist, typically at least 4-6 weeks. ? ?POSTOPERATIVE CONSTIPATION PROTOCOL ?Constipation - defined medically as fewer than three stools per week and severe constipation as less  than one stool per week. ? ?One of the most common issues patients have following surgery is constipation.  Even if you have a regular bowel pattern at home, your normal regimen is likely to be disrupted due to multiple reasons following surgery.  Combination of anesthesia, postoperative narcotics, change in appetite and fluid intake all can  affect your bowels.  In order to avoid complications following surgery, here are some recommendations in order to help you during your recovery period. ? ?Colace (docusate) - Pick up an over-the-counter form of Colace or another stool softener and take twice a day as long as you are requiring postoperative pain medications.  Take with a full glass of water daily.  If you experience loose stools or diarrhea, hold the colace until you stool forms back up.  If your symptoms do not get better within 1 week or if they get worse, check with your doctor. ?Dulcolax (bisacodyl) - Pick up over-the-counter and take as directed by the product packaging as needed to assist with the movement of your bowels.  Take with a full glass of water.  Use this product as needed if not relieved by Colace only.  ?MiraLax (polyethylene glycol) - Pick up over-the-counter to have on hand.  MiraLax is a solution that will increase the amount of water in your bowels to assist with bowel movements.  Take as directed and can mix with a glass of water, juice, soda, coffee, or tea.  Take if you go more than two days without a movement.Do not use MiraLax more than once per day. Call your doctor if you are still constipated or irregular after using this medication for 7 days in a row. ? ?If you continue to have problems with postoperative constipation, please contact the office for further assistance and recommendations.  If you experience "the worst abdominal pain ever" or develop nausea or vomiting, please contact the office immediatly for further recommendations for treatment. ? ?ITCHING ? If you experience itching with your medications, try taking only a single pain pill, or even half a pain pill at a time.  You can also use Benadryl over the counter for itching or also to help with sleep.  ? ?MEDICATIONS ?See your medication summary on the ?After Visit Summary? that the nursing staff will review with you prior to discharge.  You may have some home  medications which will be placed on hold until you complete the course of blood thinner medication.  It is important for you to complete the blood thinner medication as prescribed by your surgeon.  Continue your approved medications as instructed at time of discharge. ? ?PRECAUTIONS ?If you experience chest pain or shortness of breath - call 911 immediately for transfer to the hospital emergency department.  ?If you develop a fever greater that 101 F, purulent drainage from wound, increased redness or drainage from wound, foul odor from the wound/dressing, or calf pain - CONTACT YOUR SURGEON.   ?                                                ?FOLLOW-UP APPOINTMENTS ?Make sure you keep all of your appointments after your operation with your surgeon and caregivers. You should call the office at the above phone number and make an appointment for approximately two weeks after the date of your surgery or on the date  instructed by your surgeon outlined in the "After Visit Summary". ? ?RANGE OF MOTION AND STRENGTHENING EXERCISES  ?These exercises are designed to help you keep full movement of your hip joint. Follow your caregiver's or physical therapist's instructions. Perform all exercises about fifteen times, three times per day or as directed. Exercise both hips, even if you have had only one joint replacement. These exercises can be done on a training (exercise) mat, on the floor, on a table or on a bed. Use whatever works the best and is most comfortable for you. Use music or television while you are exercising so that the exercises are a pleasant break in your day. This will make your life better with the exercises acting as a break in routine you can look forward to.  ?Lying on your back, slowly slide your foot toward your buttocks, raising your knee up off the floor. Then slowly slide your foot back down until your leg is straight again.  ?Lying on your back spread your legs as far apart as you can without causing  discomfort.  ?Lying on your side, raise your upper leg and foot straight up from the floor as far as is comfortable. Slowly lower the leg and repeat.  ?Lying on your back, tighten up the muscle in the fron

## 2021-09-14 NOTE — Anesthesia Preprocedure Evaluation (Addendum)
Anesthesia Evaluation  ?Patient identified by MRN, date of birth, ID band ?Patient confused ? ? ? ?Reviewed: ?Allergy & Precautions, NPO status , Patient's Chart, lab work & pertinent test results ? ?History of Anesthesia Complications ?Negative for: history of anesthetic complications ? ?Airway ?Mallampati: II ? ?TM Distance: >3 FB ?Neck ROM: Full ? ? ? Dental ?no notable dental hx. ?(+) Dental Advisory Given ?  ?Pulmonary ?neg pulmonary ROS, former smoker,  ?  ?Pulmonary exam normal ? ? ? ? ? ? ? Cardiovascular ?negative cardio ROS ?Normal cardiovascular exam ? ? ?  ?Neuro/Psych ?negative neurological ROS ?   ? GI/Hepatic ?negative GI ROS, Neg liver ROS,   ?Endo/Other  ?negative endocrine ROS ? Renal/GU ?negative Renal ROS  ? ?  ?Musculoskeletal ? ?(+) Arthritis ,  ? Abdominal ?  ?Peds ? Hematology ? ?(+) Blood dyscrasia, , CLL   ?Anesthesia Other Findings ? ? Reproductive/Obstetrics ? ?  ? ? ? ? ? ? ? ? ? ? ? ? ? ?  ?  ? ? ? ? ? ? ? ?Anesthesia Physical ? ?Anesthesia Plan ? ?ASA: 3 ? ?Anesthesia Plan: Spinal  ? ?Post-op Pain Management: Ofirmev IV (intra-op)*  ? ?Induction:  ? ?PONV Risk Score and Plan: 2 and Propofol infusion and Ondansetron ? ?Airway Management Planned: Natural Airway and Simple Face Mask ? ?Additional Equipment:  ? ?Intra-op Plan:  ? ?Post-operative Plan:  ? ?Informed Consent: I have reviewed the patients History and Physical, chart, labs and discussed the procedure including the risks, benefits and alternatives for the proposed anesthesia with the patient or authorized representative who has indicated his/her understanding and acceptance.  ? ? ? ?Dental advisory given and Consent reviewed with POA ? ?Plan Discussed with: Anesthesiologist and CRNA ? ?Anesthesia Plan Comments:   ? ? ? ? ? ?Anesthesia Quick Evaluation ? ?

## 2021-09-14 NOTE — Anesthesia Postprocedure Evaluation (Signed)
Anesthesia Post Note ? ?Patient: Frederick Pham ? ?Procedure(s) Performed: TOTAL HIP ARTHROPLASTY ANTERIOR APPROACH (Left: Hip) ? ?  ? ?Patient location during evaluation: PACU ?Anesthesia Type: Spinal ?Level of consciousness: awake and alert ?Pain management: pain level controlled ?Vital Signs Assessment: post-procedure vital signs reviewed and stable ?Respiratory status: spontaneous breathing and respiratory function stable ?Cardiovascular status: blood pressure returned to baseline and stable ?Postop Assessment: spinal receding ?Anesthetic complications: no ? ? ?No notable events documented. ? ?Last Vitals:  ?Vitals:  ? 09/14/21 1830 09/14/21 1845  ?BP: (!) 105/57 107/60  ?Pulse: 79 79  ?Resp: 16 18  ?Temp:    ?SpO2: 100% 99%  ?  ?Last Pain:  ?Vitals:  ? 09/14/21 1845  ?TempSrc:   ?PainSc: 0-No pain  ? ? ?  ?  ?  ?  ?  ?  ? ?Azaryah Oleksy DANIEL ? ? ? ? ?

## 2021-09-14 NOTE — Anesthesia Procedure Notes (Signed)
Procedure Name: [place] ?Date/Time: 09/14/2021 4:33 PM ?Performed by: Lissa Morales, CRNA ?Pre-anesthesia Checklist: Patient identified, Emergency Drugs available, Suction available and Patient being monitored ?Patient Re-evaluated:Patient Re-evaluated prior to induction ?Oxygen Delivery Method: Simple face mask ?Placement Confirmation: positive ETCO2 ? ? ? ? ?

## 2021-09-14 NOTE — Progress Notes (Signed)
Patient refused lab. Notified MD. ?

## 2021-09-14 NOTE — Op Note (Signed)
?    OPERATIVE REPORT- TOTAL HIP ARTHROPLASTY ? ? ?PREOPERATIVE DIAGNOSIS: Displaced left femoral neck fracture ? ?POSTOPERATIVE DIAGNOSIS: Displaced left femoral neck fracture ? ?PROCEDURE: Left total hip arthroplasty, anterior approach.  ? ?SURGEON: Gaynelle Arabian, MD  ? ?ASSISTANT: Theresa Duty, PA-C ? ?ANESTHESIA:  Spinal ? ?ESTIMATED BLOOD LOSS:-550 mL   ? ?DRAINS: Hemovac x1.  ? ?COMPLICATIONS: None ?  ?CONDITION: PACU - hemodynamically stable.  ? ?BRIEF CLINICAL NOTE: Frederick Pham is a 71 y.o. male who fell and sustained a displaced left femoral neck fracture yesterday and presents for left total hip arthroplasty ? ?PROCEDURE IN DETAIL: After successful administration of spinal  ?anesthetic, the traction boots for the [facility] bed were placed on both  ?feet and the patient was placed onto the [facility] bed, boots placed into the leg  ?holders. The Left hip was then isolated from the perineum with plastic  ?drapes and prepped and draped in the usual sterile fashion. ASIS and  ?greater trochanter were marked and a oblique incision was made, starting  ?at about 1 cm lateral and 2 cm distal to the ASIS and coursing towards  ?the anterior cortex of the femur. The skin was cut with a 10 blade  ?through subcutaneous tissue to the level of the fascia overlying the  ?tensor fascia lata muscle. The fascia was then incised in line with the  ?incision at the junction of the anterior third and posterior 2/3rd. The  ?muscle was teased off the fascia and then the interval between the TFL  ?and the rectus was developed. The Hohmann retractor was then placed at  ?the top of the femoral neck over the capsule. The vessels overlying the  ?capsule were cauterized and the fat on top of the capsule was removed.  ?A Hohmann retractor was then placed anterior underneath the rectus  ?femoris to give exposure to the entire anterior capsule. A T-shaped  ?capsulotomy was performed. The edges were tagged and the femoral head  ?was  identified. ?      Osteophytes are removed off the superior acetabulum.  ?The femoral neck was then cut in situ with an oscillating saw. Traction  ?was then applied to the left lower extremity utilizing the [facility]  ?traction. The femoral head was then removed. Retractors were placed  ?around the acetabulum and then circumferential removal of the labrum was  ?performed. Osteophytes were also removed. Reaming starts at 47 mm to  ?medialize and  Increased in 2 mm increments to 49 mm. We reamed in  ?approximately 40 degrees of abduction, 20 degrees anteversion. A 50 mm  ?pinnacle acetabular shell was then impacted in anatomic position under  ?fluoroscopic guidance with excellent purchase. We did not need to place  ?any additional dome screws. A 32 mm neutral + 4 marathon liner was then  ?placed into the acetabular shell.  ?     The femoral lift was then placed along the lateral aspect of the femur  ?just distal to the vastus ridge. The leg was  externally rotated and capsule  ?was stripped off the inferior aspect of the femoral neck down to the  ?level of the lesser trochanter, this was done with electrocautery. The femur was lifted after this was performed. The  leg was then placed in an extended and adducted position essentially delivering the femur. We also removed the capsule superiorly and the piriformis from the piriformis fossa to gain excellent exposure of the  ?proximal femur. Rongeur was used to remove some cancellous  bone to get  ?into the lateral portion of the proximal femur for placement of the  ?initial starter reamer. The starter broaches was placed  the starter broach  and was shown to go down the center of the canal. Broaching  ?with the Actis system was then performed starting at size 0  coursing  ?Up to size 6. A size 6 had excellent torsional and rotational  ?and axial stability. The trial standard offset neck was then placed  ?with a 32 + 1 trial head. The hip was then reduced. We confirmed that   ?the stem was in the canal both on AP and lateral x-rays. It also has excellent sizing. The hip was reduced with outstanding stability through full extension and full external rotation.. AP pelvis was taken and the leg lengths were measured and found to be equal. Hip was then dislocated again and the femoral head and neck removed. The  ?femoral broach was removed. Size 6 Actis stem with a standard offset  ?neck was then impacted into the femur following native anteversion. Has  ?excellent purchase in the canal. Excellent torsional and rotational and  ?axial stability. It is confirmed to be in the canal on AP and lateral  ?fluoroscopic views. The 32 + 1 ceramic head was placed and the hip  ?reduced with outstanding stability. Again AP pelvis was taken and it  ?confirmed that the leg lengths were equal. The wound was then copiously  ?irrigated with saline solution and the capsule reattached and repaired  ?with Ethibond suture. 30 ml of .25% Bupivicaine was  injected into the capsule and into the edge of the tensor fascia lata as well as subcutaneous tissue. The fascia overlying the tensor fascia lata was then closed with a running #1 V-Loc. Subcu was closed with interrupted 2-0 Vicryl and subcuticular running 4-0 Monocryl. Incision was cleaned  ?and dried. Steri-Strips and a bulky sterile dressing applied. The patient was awakened and transported to  ?recovery in stable condition.  ?      Please note that a surgical assistant was a medical necessity for this procedure to perform it in a safe and expeditious manner. Assistant was necessary to provide appropriate retraction of vital neurovascular structures and to prevent femoral fracture and allow for anatomic placement of the prosthesis. ? ?Gaynelle Arabian, M.D.  ? ? ?

## 2021-09-14 NOTE — Anesthesia Procedure Notes (Signed)
Spinal ? ?Patient location during procedure: OR ?Start time: 09/14/2021 4:29 PM ?End time: 09/14/2021 4:38 PM ?Reason for block: surgical anesthesia ?Staffing ?Performed: anesthesiologist  ?Anesthesiologist: Duane Boston, MD ?Preanesthetic Checklist ?Completed: patient identified, IV checked, risks and benefits discussed, surgical consent, monitors and equipment checked, pre-op evaluation and timeout performed ?Spinal Block ?Patient position: left lateral decubitus ?Prep: DuraPrep ?Patient monitoring: cardiac monitor, continuous pulse ox and blood pressure ?Approach: midline ?Location: L2-3 ?Injection technique: single-shot ?Needle ?Needle type: Pencan  ?Needle gauge: 24 G ?Needle length: 9 cm ?Assessment ?Events: CSF return ?Additional Notes ?Functioning IV was confirmed and monitors were applied. Sterile prep and drape, including hand hygiene and sterile gloves were used. The patient was positioned and the spine was prepped. The skin was anesthetized with lidocaine.  Free flow of clear CSF was obtained prior to injecting local anesthetic into the CSF.  The spinal needle aspirated freely following injection.  The needle was carefully withdrawn.  The patient tolerated the procedure well.  ? ? ? ?

## 2021-09-14 NOTE — Plan of Care (Signed)
  Problem: Activity: Goal: Risk for activity intolerance will decrease Outcome: Progressing   Problem: Nutrition: Goal: Adequate nutrition will be maintained Outcome: Progressing   Problem: Pain Managment: Goal: General experience of comfort will improve Outcome: Progressing   Problem: Safety: Goal: Ability to remain free from injury will improve Outcome: Progressing   

## 2021-09-14 NOTE — Transfer of Care (Signed)
Immediate Anesthesia Transfer of Care Note ? ?Patient: Frederick Pham ? ?Procedure(s) Performed: TOTAL HIP ARTHROPLASTY ANTERIOR APPROACH (Left: Hip) ? ?Patient Location: PACU ? ?Anesthesia Type:Spinal ? ?Level of Consciousness: sedated and patient cooperative ? ?Airway & Oxygen Therapy: Patient Spontanous Breathing and Patient connected to face mask oxygen ? ?Post-op Assessment: Report given to RN and Post -op Vital signs reviewed and stable ? ?Post vital signs: stable ? ?Last Vitals:  ?Vitals Value Taken Time  ?BP 96/48 09/14/21 1815  ?Temp    ?Pulse 70 09/14/21 1819  ?Resp 16 09/14/21 1819  ?SpO2 100 % 09/14/21 1819  ?Vitals shown include unvalidated device data. ? ?Last Pain:  ?Vitals:  ? 09/14/21 1512  ?TempSrc:   ?PainSc: 0-No pain  ?   ? ?Patients Stated Pain Goal: 2 (09/13/21 6712) ? ?Complications: No notable events documented. ?

## 2021-09-14 NOTE — Progress Notes (Signed)
?PROGRESS NOTE ? ? ? ?Frederick Pham  CVE:[id number] DOB: June 27, 1950 DOA: 09/12/2021 ?PCP: Yvonna Alanis, NP  ? ?Brief Narrative:  ?71 year old male with history of CLL and recent right TKA for osteoarthritis presented with accidental fall while working with therapy at [facility].  He was found to have left hip fracture.  Orthopedics was consulted. ? ?Assessment & Plan: ?  ?Closed left hip fracture secondary to accidental mechanical fall ?-Orthopedics following: Planning for surgical repair today ?-Continue pain management.  Fall precautions.  DVT prophylaxis as per orthopedics ?-PT/OT after surgery ? ?Delirium ?Agitation ?-Patient has had issues with intermittent agitation.  He is currently talking in sentences which do not make sense.  Family is concerned that this might be from IV morphine. ?-Monitor mental status.  Use Haldol as needed.  Will avoid IV morphine. ?-If mental status does not improve after surgical intervention, will need possible MRI of brain ?-Patient refusing labs. ? ?CLL ?Chronic leukocytosis ?-Follows up with Dr. Dorsey/oncology: Latest plan per oncology note on 08/24/2021 was for continued observation ?-WBCs on 09/12/2021 was 50.9 which is at his baseline ? ?Status post right TKA on 09/05/2021 for osteoarthritis of right knee ?-Has some right lower extremity swelling: Venous Doppler negative for DVT. ?-Orthopedic surgery following ? ?Anemia of chronic disease ?-H&H stable on 09/12/2021. ? ? ?DVT prophylaxis: SCDs ?Code Status: Full ?Family Communication: Wife and daughter at bedside ?Disposition Plan: ?Status is: Inpatient ?Remains inpatient appropriate because: Of severity of illness ? ? ? ?Consultants: Orthopedics ? ?Procedures: None ? ?Antimicrobials: None ? ? ?Subjective: ?Patient seen and examined at bedside.  Nursing staff reports intermittent agitation and patient's refusal of blood work.  No overnight fever, seizures, vomiting reported. ? ?Objective: ?Vitals:  ? 09/13/21 1044 09/13/21 1405 09/13/21  1847 09/14/21 0559  ?BP:  134/64 126/70 122/68  ?Pulse: 83 84 90 83  ?Resp: '13 16 18 20  '$ ?Temp:  97.7 ?F (36.5 ?C) 98.4 ?F (36.9 ?C) 98.5 ?F (36.9 ?C)  ?TempSrc:  Oral Oral Oral  ?SpO2: 100% 98% 96%   ?Weight:      ?Height:      ? ? ?Intake/Output Summary (Last 24 hours) at 09/14/2021 1002 ?Last data filed at 09/14/2021 0600 ?Gross per 24 hour  ?Intake 193.33 ml  ?Output 1250 ml  ?Net -1056.67 ml  ? ?Filed Weights  ? 09/13/21 0147  ?Weight: 68.2 kg  ? ? ?Examination: ? ?General exam: Appears calm and comfortable.  Currently on room air. ?Respiratory system: Bilateral decreased breath sounds at bases ?Cardiovascular system: S1 & S2 heard, Rate controlled ?Gastrointestinal system: Abdomen is nondistended, soft and nontender. Normal bowel sounds heard. ?Extremities: No cyanosis, clubbing, edema.  Upper extremity mittens present ?Central nervous system: Awake, poor historian, slow to respond.  No focal neurological deficits. Moving extremities ?Skin: No rashes, lesions or ulcers ?Psychiatry: Talks in sentences which do not make sense.  Currently no signs of agitation. ? ? ? ?Data Reviewed: I have personally reviewed following labs and imaging studies ? ?CBC: ?Recent Labs  ?Lab 09/12/21 ?2341  ?WBC 50.9*  ?NEUTROABS 2.5  ?HGB 9.8*  ?HCT 30.1*  ?MCV 95.9  ?PLT 163  ? ?Basic Metabolic Panel: ?Recent Labs  ?Lab 09/12/21 ?2341  ?NA 137  ?K 4.5  ?CL 101  ?CO2 28  ?GLUCOSE 125*  ?BUN 26*  ?CREATININE 1.10  ?CALCIUM 8.4*  ? ?GFR: ?Estimated Creatinine Clearance: 56.4 mL/min (by C-G formula based on SCr of 1.1 mg/dL). ?Liver Function Tests: ?No results for input(s): AST, ALT,  ALKPHOS, BILITOT, PROT, ALBUMIN in the last 168 hours. ?No results for input(s): LIPASE, AMYLASE in the last 168 hours. ?No results for input(s): AMMONIA in the last 168 hours. ?Coagulation Profile: ?No results for input(s): INR, PROTIME in the last 168 hours. ?Cardiac Enzymes: ?No results for input(s): CKTOTAL, CKMB, CKMBINDEX, TROPONINI in the last 168  hours. ?BNP (last 3 results) ?No results for input(s): PROBNP in the last 8760 hours. ?HbA1C: ?No results for input(s): HGBA1C in the last 72 hours. ?CBG: ?No results for input(s): GLUCAP in the last 168 hours. ?Lipid Profile: ?No results for input(s): CHOL, HDL, LDLCALC, TRIG, CHOLHDL, LDLDIRECT in the last 72 hours. ?Thyroid Function Tests: ?No results for input(s): TSH, T4TOTAL, FREET4, T3FREE, THYROIDAB in the last 72 hours. ?Anemia Panel: ?No results for input(s): VITAMINB12, FOLATE, FERRITIN, TIBC, IRON, RETICCTPCT in the last 72 hours. ?Sepsis Labs: ?No results for input(s): PROCALCITON, LATICACIDVEN in the last 168 hours. ? ?Recent Results (from the past 240 hour(s))  ?Surgical pcr screen     Status: None  ? Collection Time: 09/13/21  6:17 AM  ? Specimen: Nasal Mucosa; Nasal Swab  ?Result Value Ref Range Status  ? MRSA, PCR NEGATIVE NEGATIVE Final  ? Staphylococcus aureus NEGATIVE NEGATIVE Final  ?  Comment: (NOTE) ?The Xpert SA Assay (FDA approved for NASAL specimens in patients 44 ?years of age and older), is one component of a comprehensive ?surveillance program. It is not intended to diagnose infection nor to ?guide or monitor treatment. ?Performed at [facility], [address] ?[place], [place] [address] ?  ?  ? ? ? ? ? ?Radiology Studies: ?DG Chest 1 View ? ?Result Date: 09/13/2021 ?CLINICAL DATA:  Fall with left hip pain EXAM: CHEST  1 VIEW COMPARISON:  CT 02/18/2021 FINDINGS: Low lung volumes. No focal opacity, pleural effusion or pneumothorax. Normal cardiomediastinal silhouette. IMPRESSION: No active disease. Electronically Signed   By: Donavan Foil M.D.   On: 09/13/2021 00:06  ? ?DG Hip Unilat W or Wo Pelvis 2-3 Views Left ? ?Result Date: 09/13/2021 ?CLINICAL DATA:  Fall EXAM: DG HIP (WITH OR WITHOUT PELVIS) 2-3V LEFT COMPARISON:  None. FINDINGS: Acute fracture involving the left femoral neck with cranial migration of the trochanter. No femoral head dislocation. Pubic symphysis  and rami appear intact IMPRESSION: Acute displaced left femoral neck fracture Electronically Signed   By: Donavan Foil M.D.   On: 09/13/2021 00:06  ? ?VAS [place] LOWER EXTREMITY VENOUS (DVT) ? ?Result Date: 09/13/2021 ? Lower Venous DVT Study Patient Name:  Frederick Pham  Date of Exam:   09/13/2021 Medical Rec #: [id number]        Accession #:    [id number] Date of Birth: 1951/01/25       Patient Gender: M Patient Age:   76 years Exam Location:  [facility] Procedure:      VAS [place] LOWER EXTREMITY VENOUS (DVT) Referring Phys: Bretta Bang GONFA --------------------------------------------------------------------------------  Indications: Swelling.  Risk Factors: Right TKA on 09/05/21. Fall on 09/12/21 resulting in left hip fracture. Anticoagulation: Xarelto (post op TKA). Limitations: Poor ultrasound/tissue interface and patient immobility. Comparison Study: No previous exams Performing Technologist: Jody Hill RVT, RDMS  Examination Guidelines: A complete evaluation includes B-mode imaging, spectral Doppler, color Doppler, and power Doppler as needed of all accessible portions of each vessel. Bilateral testing is considered an integral part of a complete examination. Limited examinations for reoccurring indications may be performed as noted. The reflux portion of the exam is performed with the  patient in reverse Trendelenburg.  +--------+---------------+---------+-----------+----------+--------------------+ RIGHT   CompressibilityPhasicitySpontaneityPropertiesThrombus Aging       +--------+---------------+---------+-----------+----------+--------------------+ CFV     Full           Yes      Yes                                       +--------+---------------+---------+-----------+----------+--------------------+ SFJ     Full                                                              +--------+---------------+---------+-----------+----------+--------------------+ FV Prox Full           Yes      Yes                                        +--------+---------------+---------+-----------+----------+--------------------+ FV Mid  Full           Yes      Yes                                       +--------+---------------+---------+-----

## 2021-09-14 NOTE — Progress Notes (Signed)
Spoke to patient's wife about going down to short stay with her husband at her request.  ? ?Short stay verified that would be fine.  ? ? ?SWhittemore, RN ?

## 2021-09-14 NOTE — Interval H&P Note (Signed)
History and Physical Interval Note: ? ?09/14/2021 ?3:18 PM ? ?Frederick Pham  has presented today for surgery, with the diagnosis of Left femoral neck fracture.  The various methods of treatment have been discussed with the patient and family. After consideration of risks, benefits and other options for treatment, the patient has consented to  Procedure(s): ?TOTAL HIP ARTHROPLASTY ANTERIOR APPROACH (Left) as a surgical intervention.  The patient's history has been reviewed, patient examined, no change in status, stable for surgery.  I have reviewed the patient's chart and labs.  Questions were answered to the patient's satisfaction.   ? ? ?Pilar Plate Mendi Constable ? ? ?

## 2021-09-14 NOTE — Telephone Encounter (Signed)
Attempted to call but no answer. Left message on Frederick Pham phone. Recommend discussing medical issues with attending hospitalist while at [facility].

## 2021-09-15 ENCOUNTER — Ambulatory Visit: Payer: Medicare Other | Admitting: Orthopedic Surgery

## 2021-09-15 ENCOUNTER — Encounter (HOSPITAL_COMMUNITY): Payer: Self-pay | Admitting: Orthopedic Surgery

## 2021-09-15 DIAGNOSIS — S72002A Fracture of unspecified part of neck of left femur, initial encounter for closed fracture: Secondary | ICD-10-CM | POA: Diagnosis not present

## 2021-09-15 DIAGNOSIS — R410 Disorientation, unspecified: Secondary | ICD-10-CM | POA: Diagnosis not present

## 2021-09-15 DIAGNOSIS — C9110 Chronic lymphocytic leukemia of B-cell type not having achieved remission: Secondary | ICD-10-CM | POA: Diagnosis not present

## 2021-09-15 DIAGNOSIS — D638 Anemia in other chronic diseases classified elsewhere: Secondary | ICD-10-CM | POA: Diagnosis not present

## 2021-09-15 LAB — BASIC METABOLIC PANEL
Anion gap: 6 (ref 5–15)
BUN: 26 mg/dL — ABNORMAL HIGH (ref 8–23)
CO2: 28 mmol/L (ref 22–32)
Calcium: 8.4 mg/dL — ABNORMAL LOW (ref 8.9–10.3)
Chloride: 108 mmol/L (ref 98–111)
Creatinine, Ser: 0.93 mg/dL (ref 0.61–1.24)
GFR, Estimated: >60 mL/min (ref >60)
Glucose, Bld: 158 mg/dL — ABNORMAL HIGH (ref 70–99)
Potassium: 4.3 mmol/L (ref 3.5–5.1)
Sodium: 142 mmol/L (ref 135–145)

## 2021-09-15 LAB — CBC
HCT: 27.7 % — ABNORMAL LOW (ref 39.0–52.0)
Hemoglobin: 8.9 g/dL — ABNORMAL LOW (ref 13.0–17.0)
MCH: 31.7 pg (ref 26.0–34.0)
MCHC: 32.1 g/dL (ref 30.0–36.0)
MCV: 98.6 fL (ref 80.0–100.0)
Platelets: 169 10*3/uL (ref 150–400)
RBC: 2.81 MIL/uL — ABNORMAL LOW (ref 4.22–5.81)
RDW: 13.9 % (ref 11.5–15.5)
WBC: 55.3 10*3/uL (ref 4.0–10.5)
nRBC: 0 % (ref 0.0–0.2)

## 2021-09-15 NOTE — Progress Notes (Signed)
?PROGRESS NOTE ? ? ? ?Frederick Pham  XBL:[id number] DOB: 02-06-1951 DOA: 09/12/2021 ?PCP: Yvonna Alanis, NP  ? ?Brief Narrative:  ?71 year old male with history of CLL and recent right TKA for osteoarthritis presented with accidental fall while working with therapy at [facility].  He was found to have left hip fracture.  He underwent left total hip arthroplasty on 09/14/2021. ?Assessment & Plan: ?  ?Closed left hip fracture secondary to accidental mechanical fall ?-Orthopedics following: Status post left total hip arthroplasty on 09/14/2021 ?-Continue pain management.  Use Tylenol and tramadol if needed.  Fall precautions.  DVT prophylaxis as per orthopedics ?-PT/OT eval. ? ?Delirium ?Agitation ?-Family is concerned that this might be from IV morphine/narcotic ?-Monitor mental status.  Use Haldol as needed.  Will avoid IV morphine/narcotics. ?-If mental status does not improve after surgical intervention, will need possible MRI of brain ?-Patient seems more appropriate this morning. ? ?CLL ?Chronic leukocytosis ?-Follows up with Dr. Dorsey/oncology: Latest plan per oncology note on 08/24/2021 was for continued observation ?-WBCs 55.3 today. ? ?Status post right TKA on 09/05/2021 for osteoarthritis of right knee ?-Has some right lower extremity swelling: Venous Doppler negative for DVT. ?-Orthopedic surgery following ? ?Anemia of chronic disease ?-H&H stable on 09/12/2021. ? ? ?DVT prophylaxis: SCDs ?Code Status: Full ?Family Communication: Wife at bedside on 09/15/2021 ?Disposition Plan: ?Status is: Inpatient ?Remains inpatient appropriate because: Of severity of illness ? ? ? ?Consultants: Orthopedics ? ?Procedures: left total hip arthroplasty on 09/14/2021 ? ?Antimicrobials: Perioperative ? ? ?Subjective: ?Patient seen and examined at bedside.  No overnight agitation, seizures, fever or vomiting reported by nursing staff. ?Objective: ?Vitals:  ? 09/14/21 1939 09/14/21 2108 09/15/21 0114 09/15/21 0531  ?BP: (!) 98/57 (!) 121/57 (!)  120/56 (!) 128/57  ?Pulse: 75 86 92 84  ?Resp: '17 16 16 16  '$ ?Temp: 98.4 ?F (36.9 ?C) 98.3 ?F (36.8 ?C) 99.4 ?F (37.4 ?C) 99.4 ?F (37.4 ?C)  ?TempSrc: Oral Oral  Oral  ?SpO2: 99% 98% 97% 98%  ?Weight:      ?Height:      ? ? ?Intake/Output Summary (Last 24 hours) at 09/15/2021 0726 ?Last data filed at 09/15/2021 0530 ?Gross per 24 hour  ?Intake 3079.88 ml  ?Output 2500 ml  ?Net 579.88 ml  ? ? ?Filed Weights  ? 09/13/21 0147  ?Weight: 68.2 kg  ? ? ?Examination: ? ?General exam: No distress.  On room air currently.  Elderly male lying in bed.  Respiratory system: Decreased breath sounds at bases bilaterally with some scattered crackles  ?cardiovascular system: Currently rate controlled; S1-S2 heard  ?gastrointestinal system: Abdomen is distended slightly; soft and nontender.  Bowel sounds are heard  ?extremities: Trace lower extremity edema present; no clubbing  ?central nervous system: Alert, more responsive today.  No focal neurological deficits.  Moves extremities  ?skin: No obvious ecchymosis/lesions  ?psychiatry: Currently not agitated.  Affect is mostly flat.  Speaking in sentences which make more sense today compared to yesterday. ? ? ? ?Data Reviewed: I have personally reviewed following labs and imaging studies ? ?CBC: ?Recent Labs  ?Lab 09/12/21 ?2341 09/15/21 ?3007  ?WBC 50.9* 55.3*  ?NEUTROABS 2.5  --   ?HGB 9.8* 8.9*  ?HCT 30.1* 27.7*  ?MCV 95.9 98.6  ?PLT 163 169  ? ? ?Basic Metabolic Panel: ?Recent Labs  ?Lab 09/12/21 ?2341 09/15/21 ?6226  ?NA 137 142  ?K 4.5 4.3  ?CL 101 108  ?CO2 28 28  ?GLUCOSE 125* 158*  ?BUN 26* 26*  ?CREATININE  1.10 0.93  ?CALCIUM 8.4* 8.4*  ? ? ?GFR: ?Estimated Creatinine Clearance: 66.7 mL/min (by C-G formula based on SCr of 0.93 mg/dL). ?Liver Function Tests: ?No results for input(s): AST, ALT, ALKPHOS, BILITOT, PROT, ALBUMIN in the last 168 hours. ?No results for input(s): LIPASE, AMYLASE in the last 168 hours. ?No results for input(s): AMMONIA in the last 168 hours. ?Coagulation  Profile: ?No results for input(s): INR, PROTIME in the last 168 hours. ?Cardiac Enzymes: ?No results for input(s): CKTOTAL, CKMB, CKMBINDEX, TROPONINI in the last 168 hours. ?BNP (last 3 results) ?No results for input(s): PROBNP in the last 8760 hours. ?HbA1C: ?No results for input(s): HGBA1C in the last 72 hours. ?CBG: ?No results for input(s): GLUCAP in the last 168 hours. ?Lipid Profile: ?No results for input(s): CHOL, HDL, LDLCALC, TRIG, CHOLHDL, LDLDIRECT in the last 72 hours. ?Thyroid Function Tests: ?No results for input(s): TSH, T4TOTAL, FREET4, T3FREE, THYROIDAB in the last 72 hours. ?Anemia Panel: ?No results for input(s): VITAMINB12, FOLATE, FERRITIN, TIBC, IRON, RETICCTPCT in the last 72 hours. ?Sepsis Labs: ?No results for input(s): PROCALCITON, LATICACIDVEN in the last 168 hours. ? ?Recent Results (from the past 240 hour(s))  ?Surgical pcr screen     Status: None  ? Collection Time: 09/13/21  6:17 AM  ? Specimen: Nasal Mucosa; Nasal Swab  ?Result Value Ref Range Status  ? MRSA, PCR NEGATIVE NEGATIVE Final  ? Staphylococcus aureus NEGATIVE NEGATIVE Final  ?  Comment: (NOTE) ?The Xpert SA Assay (FDA approved for NASAL specimens in patients 76 ?years of age and older), is one component of a comprehensive ?surveillance program. It is not intended to diagnose infection nor to ?guide or monitor treatment. ?Performed at [facility], [address] ?[place], [place] [address] ?  ? ?  ? ? ? ? ? ?Radiology Studies: ?DG Pelvis Portable ? ?Result Date: 09/14/2021 ?CLINICAL DATA:  Status post left hip arthroplasty EXAM: PORTABLE PELVIS 1-2 VIEWS COMPARISON:  None. FINDINGS: There is evidence of recent left hip arthroplasty. There are pockets of air in the soft tissues. No fracture is seen. IMPRESSION: Status post left hip arthroplasty. Electronically Signed   By: Elmer Picker M.D.   On: 09/14/2021 19:11  ? ?DG C-Arm 1-60 Min-No Report ? ?Result Date: 09/14/2021 ?Fluoroscopy was utilized by  the requesting physician.  No radiographic interpretation.  ? ?DG HIP PORT UNILAT WITH PELVIS 1V LEFT ? ?Result Date: 09/14/2021 ?CLINICAL DATA:  Left hip fracture. EXAM: DG HIP (WITH OR WITHOUT PELVIS) 1V PORT LEFT COMPARISON:  Radiographs 09/12/2021 FINDINGS: Intraoperative spot films demonstrate placement of a total left hip arthroplasty. The components are well seated. No complicating features are identified. IMPRESSION: Well seated components of a total left hip arthroplasty. Electronically Signed   By: Marijo Sanes M.D.   On: 09/14/2021 18:37  ? ?VAS [place] LOWER EXTREMITY VENOUS (DVT) ? ?Result Date: 09/13/2021 ? Lower Venous DVT Study Patient Name:  GATLIN KITTELL  Date of Exam:   09/13/2021 Medical Rec #: [id number]        Accession #:    [id number] Date of Birth: [birth year]       Patient Gender: M Patient Age:   32 years Exam [facility]:  [facility] Procedure:      VAS [place] LOWER EXTREMITY VENOUS (DVT) Referring Phys: Bretta Bang GONFA --------------------------------------------------------------------------------  Indications: Swelling.  Risk Factors: Right TKA on 09/05/21. Fall on 09/12/21 resulting in left hip fracture. Anticoagulation: Xarelto (post op TKA). Limitations: Poor ultrasound/tissue interface and patient immobility.  Comparison Study: No previous exams Performing Technologist: Jody Hill RVT, RDMS  Examination Guidelines: A complete evaluation includes B-mode imaging, spectral Doppler, color Doppler, and power Doppler as needed of all accessible portions of each vessel. Bilateral testing is considered an integral part of a complete examination. Limited examinations for reoccurring indications may be performed as noted. The reflux portion of the exam is performed with the patient in reverse Trendelenburg.  +--------+---------------+---------+-----------+----------+--------------------+ RIGHT   CompressibilityPhasicitySpontaneityPropertiesThrombus Aging        +--------+---------------+---------+-----------+----------+--------------------+ CFV     Full           Yes      Yes                                       +--------+---------------+---------+-----------+----------+--------------------+ SFJ     Full

## 2021-09-15 NOTE — Progress Notes (Signed)
? ?  Subjective: ?1 Day Post-Op Procedure(s) (LRB): ?TOTAL HIP ARTHROPLASTY ANTERIOR APPROACH (Left) ?Patient reports pain as moderate.   ?Patient seen in rounds by Dr. Wynelle Link. ?Patient is well, and has had no acute complaints or problems. Denies SOB, chest pain, or calf pain. No acute overnight events. Will begin therapy today.  ? ? ?Objective: ?Vital signs in last 24 hours: ?Temp:  [98.2 ?F (36.8 ?C)-99.4 ?F (37.4 ?C)] 99.4 ?F (37.4 ?C) (04/06 0531) ?Pulse Rate:  [73-92] 84 (04/06 0531) ?Resp:  [14-18] 16 (04/06 0531) ?BP: (96-128)/(48-76) 128/57 (04/06 0531) ?SpO2:  [97 %-100 %] 98 % (04/06 0531) ? ?Intake/Output from previous day: ? ?Intake/Output Summary (Last 24 hours) at 09/15/2021 0746 ?Last data filed at 09/15/2021 0530 ?Gross per 24 hour  ?Intake 3079.88 ml  ?Output 2500 ml  ?Net 579.88 ml  ?  ? ?Intake/Output this shift: ?No intake/output data recorded. ? ?Labs: ?Recent Labs  ?  09/12/21 ?2341 09/15/21 ?9417  ?HGB 9.8* 8.9*  ? ?Recent Labs  ?  09/12/21 ?2341 09/15/21 ?4081  ?WBC 50.9* 55.3*  ?RBC 3.14* 2.81*  ?HCT 30.1* 27.7*  ?PLT 163 169  ? ?Recent Labs  ?  09/12/21 ?2341 09/15/21 ?4481  ?NA 137 142  ?K 4.5 4.3  ?CL 101 108  ?CO2 28 28  ?BUN 26* 26*  ?CREATININE 1.10 0.93  ?GLUCOSE 125* 158*  ?CALCIUM 8.4* 8.4*  ? ?No results for input(s): LABPT, INR in the last 72 hours. ? ?Exam: ?General - Patient is Alert and Oriented ?Extremity - Neurologically intact ?Neurovascular intact ?Intact pulses distally ?Dorsiflexion/Plantar flexion intact ?Dressing - dressing C/D/I ?Motor Function - intact, moving foot and toes well on exam.  ? ?Past Medical History:  ?Diagnosis Date  ? Arthritis   ? CLL (chronic lymphocytic leukemia) ([place])   ? ? ?Assessment/Plan: ?1 Day Post-Op Procedure(s) (LRB): ?TOTAL HIP ARTHROPLASTY ANTERIOR APPROACH (Left) ?Principal Problem: ?  Fracture of femoral neck, left ([place]) ?Active Problems: ?  Osteoarthritis of right knee s/p TKA on 09/05/2021 ?  Chronic lymphocytic leukemia, Rai stage I  ([place]) ?  Fall at nursing home ?  Anemia of chronic disease ?  Swelling of right lower extremity ?  Delirium ?  Leukocytosis ? ?Estimated body mass index is 24.27 kg/m? as calculated from the following: ?  Height as of this encounter: '5\' 6"'$  (1.676 m). ?  Weight as of this encounter: 68.2 kg. ?Discharge to SNF ? ?DVT Prophylaxis - Xarelto and TED hose ?Weight bearing as tolerated. ?Continue therapy. ? ?Plan is to go Skilled nursing facility after hospital stay.  ? ?Plan for two sessions with PT today.  ? ?Patient to follow up in two weeks with Dr. Wynelle Link in clinic.  ? ?The PDMP database was reviewed today prior to any opioid medications being prescribed to this patient.. ? ? ?Fenton Foy, MBA, PA-C ?Orthopedic Surgery ?[phone] ?09/15/2021, 7:46 AM ? ?

## 2021-09-15 NOTE — Addendum Note (Signed)
Addendum  created 09/15/21 0939 by Lynda Rainwater, MD  ? Intraprocedure Event edited, Intraprocedure Staff edited  ?  ?

## 2021-09-15 NOTE — Evaluation (Addendum)
Physical Therapy Evaluation ?Patient Details ?Name: Frederick Pham ?MRN: [id number] ?DOB: 05/22/1951 ?Today's Date: 09/15/2021 ? ?History of Present Illness ? 71 y.o. male admitted 09/12/21 with L femoral neck fx, s/p AA-THA on 09/14/21. Pt had R TKA on 09/05/21, fell at home while doing standing hip abduction for his TKA HEP. PMH includes: CLL, OA.  ?Clinical Impression ? Pt is s/p THA resulting in the deficits listed below (see PT Problem List). Pt ambulated 120' with RW with min/guard assist. Instructed pt/wife in HEP for L hip. Pt has some confusion, which wife reports is new since his TKA 09/05/21. She also reports he has L sided weakness, tremors, and shuffling gait at baseline. Wife reports pt declined a neurology consult when it was ordered by primary Dr to assess these issues.  Pt will benefit from skilled PT to increase their independence and safety with mobility to allow discharge to the venue listed below.  ? ?   ?   ? ?Recommendations for follow up therapy are one component of a multi-disciplinary discharge planning process, led by the attending physician.  Recommendations may be updated based on patient status, additional functional criteria and insurance authorization. ? ?Follow Up Recommendations Skilled nursing-short term rehab (<3 hours/day) ? ?  ?Assistance Recommended at Discharge Frequent or constant Supervision/Assistance  ?Patient can return home with the following ? A little help with walking and/or transfers;A little help with bathing/dressing/bathroom;Assistance with cooking/housework;Direct supervision/assist for medications management;Assist for transportation;Help with stairs or ramp for entrance ? ?  ?Equipment Recommendations None recommended by PT  ?Recommendations for Other Services ?    ?  ?Functional Status Assessment Patient has had a recent decline in their functional status and demonstrates the ability to make significant improvements in function in a reasonable and predictable amount  of time.  ? ?  ?Precautions / Restrictions Precautions ?Precautions: Fall ?Precaution Comments: fell at home while doing TKA exercises ?Restrictions ?Weight Bearing Restrictions: No ?LLE Weight Bearing: Weight bearing as tolerated  ? ?  ? ?Mobility ? Bed Mobility ?Overal bed mobility: Needs Assistance ?Bed Mobility: Supine to Sit ?  ?  ?Supine to sit: Min assist ?  ?  ?General bed mobility comments: min A to raise trunk and scoot hips to EOB ?  ? ?Transfers ?Overall transfer level: Needs assistance ?Equipment used: Rolling walker (2 wheels) ?Transfers: Sit to/from Stand ?Sit to Stand: Min assist ?  ?  ?  ?  ?  ?General transfer comment: VCs hand placement ?  ? ?Ambulation/Gait ?Ambulation/Gait assistance: Min guard ?Gait Distance (Feet): 120 Feet ?Assistive device: Rolling walker (2 wheels) ?Gait Pattern/deviations: Step-to pattern, Decreased stride length, Shuffle ?Gait velocity: decreased ?  ?  ?General Gait Details: no loss of balance ? ?Stairs ?  ?  ?  ?  ?  ? ?Wheelchair Mobility ?  ? ?Modified Rankin (Stroke Patients Only) ?  ? ?  ? ?Balance   ?Sitting-balance support: Feet supported, Single extremity supported ?Sitting balance-Leahy Scale: Good ?  ?  ?  ?Standing balance-Leahy Scale: Fair ?  ?  ?  ?  ?  ?  ?  ?  ?  ?  ?  ?  ?   ? ? ? ?Pertinent Vitals/Pain Pain Assessment ?Pain Assessment: Faces ?Faces Pain Scale: Hurts little more ?Pain Location: L hip and thigh with walking ?Pain Descriptors / Indicators: Grimacing, Sore ?Pain Intervention(s): Limited activity within patient's tolerance, Monitored during session, Premedicated before session, Ice applied  ? ? ?Home Living Family/patient expects to  be discharged to:: Private residence ?Living Arrangements: Spouse/significant other Frederick Pham) ?Available Help at Discharge: Family;Available 24 hours/day ?Type of Home: House ?Home Access: Stairs to enter ?Entrance Stairs-Rails: None ?Entrance Stairs-Number of Steps: 5 ?Alternate Level Stairs-Number of Steps:  13 ?Home Layout: Multi-level (Pt can stay on first floor, but has to go upstairs to shower.) ?Home Equipment: Animator (2 wheels) ?   ?  ?Prior Function Prior Level of Function : Independent/Modified Independent ?  ?  ?  ?  ?  ?  ?Mobility Comments: ambulated without AD prior to TKA 09/05/21, has used RW  since then ?ADLs Comments: Some assistance with dressing prior to TKA ?  ? ? ?Hand Dominance  ? Dominant Hand: Right ? ?  ?Extremity/Trunk Assessment  ? Upper Extremity Assessment ?LUE Deficits / Details: Wife reports LUE tremor and weakness prior to R TKA 09/05/21 and this was noted at rest and during activity ?  ? ?Lower Extremity Assessment ?RLE Deficits / Details: 3/5 SLR, no extensor lag noted; knee AAROM 0-80* ?RLE Sensation: WNL ?LLE Deficits / Details: ankle DF AROM decreased to 0*, passively I could get ankle to ~10* DF; wife reports generalized weakness on L side at baseline (she suspects Parkinsons but pt refused neuro consult when primary Dr ordered it) ?LLE Sensation: WNL ?  ? ?Cervical / Trunk Assessment ?Cervical / Trunk Assessment: Normal  ?Communication  ? Communication: No difficulties  ?Cognition Arousal/Alertness: Awake/alert ?Behavior During Therapy: [facility] for tasks assessed/performed ?Overall Cognitive Status: Impaired/Different from baseline ?Area of Impairment: Orientation, Memory ?  ?  ?  ?  ?  ?  ?  ?  ?Orientation Level: Situation, Time, Disoriented to ?  ?Memory: Decreased short-term memory ?  ?  ?  ?  ?General Comments: wife reports confusion was not present prior to TKA 09/05/21, she notices that its worse with use of narcotics (pt only had tylenol this morning but is oriented to self only), can follow 1 step commands with increased time and manual cues ?  ?  ? ?  ?General Comments   ? ?  ?Exercises Total Joint Exercises ?Ankle Circles/Pumps: AROM, Both, 10 reps, Supine ?Quad Sets: AROM, 10 reps, Supine, Right (5 sec hold; cues) ?Short Arc Quad: AROM, Both, 10 reps,  Supine ?Heel Slides: AROM, 10 reps, Supine, Both ?Hip ABduction/ADduction: AROM, 10 reps, Supine, Both ?Long Arc Quad: AROM, Both, 5 reps, Seated  ? ?Assessment/Plan  ?  ?PT Assessment Patient needs continued PT services  ?PT Problem List Decreased strength;Decreased range of motion;Decreased activity tolerance;Decreased balance;Decreased mobility;Decreased coordination;Decreased knowledge of use of DME;Decreased safety awareness;Pain ? ?   ?  ?PT Treatment Interventions DME instruction;Gait training;Stair training;Functional mobility training;Therapeutic activities;Therapeutic exercise;Balance training;Neuromuscular re-education;Patient/family education   ? 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 ?  ?Frequency Min 5X/week ?  ? ? ?Co-evaluation   ?  ?  ?  ?  ? ? ?  ?AM-PAC PT "6 Clicks" Mobility  ?Outcome Measure Help needed turning from your back to your side while in a flat bed without using bedrails?: A Little ?Help needed moving from lying on your back to sitting on the side of a flat bed without using bedrails?: A Little ?Help needed moving to and from a bed to a chair (including a wheelchair)?: A  Little ?Help needed standing up from a chair using your arms (e.g., wheelchair or bedside chair)?: A Little ?Help needed to walk in hospital room?: A Little ?Help needed climbing 3-5 steps with a railing? : A Lot ?6 Click Score: 17 ? ?  ?End of Session Equipment Utilized During Treatment: Gait belt ?Activity Tolerance: Patient tolerated treatment well ?Patient left: in chair;with call bell/phone within reach;with family/visitor present;with chair alarm set ?Nurse Communication: Mobility status ?PT Visit Diagnosis: Difficulty in walking, not elsewhere classified (R26.2);Pain;Unsteadiness on feet  (R26.81) ?Pain - Right/Left: Left ?Pain - part of body: Hip ?  ? ?Time: 6016-5800 ?PT Time Calculation (min) (ACUTE ONLY): 40 min ? ? ?Charges:   PT Evaluation ?$PT Eval Moderate Complexity: 1 Mod ?PT Treatments ?$Gait Trai

## 2021-09-15 NOTE — Plan of Care (Signed)
Problem: Clinical Measurements: ?Goal: Cardiovascular complication will be avoided ?Outcome: Progressing ?  ?Problem: Activity: ?Goal: Risk for activity intolerance will decrease ?Outcome: Progressing ?  ?Problem: Safety: ?Goal: Ability to remain free from injury will improve ?Outcome: Progressing ? ?[name], RN ?09/15/21 ?6:24 PM ?  ?

## 2021-09-15 NOTE — Anesthesia Postprocedure Evaluation (Signed)
Anesthesia Post Note ? ?Patient: Frederick Pham ? ?Procedure(s) Performed: AN AD HOC NERVE BLOCK ? ?  ? ?Patient location during evaluation: PACU ?Anesthesia Type: Regional ?Level of consciousness: awake and alert ?Pain management: pain level controlled ?Vital Signs Assessment: post-procedure vital signs reviewed and stable ?Respiratory status: spontaneous breathing, nonlabored ventilation and respiratory function stable ?Cardiovascular status: blood pressure returned to baseline and stable ?Postop Assessment: no apparent nausea or vomiting ?Anesthetic complications: no ? ? ?No notable events documented. ? ?Last Vitals:  ?Vitals:  ? 09/15/21 0114 09/15/21 0531  ?BP: (!) 120/56 (!) 128/57  ?Pulse: 92 84  ?Resp: 16 16  ?Temp: 37.4 ?C 37.4 ?C  ?SpO2: 97% 98%  ?  ?Last Pain:  ?Vitals:  ? 09/15/21 0531  ?TempSrc: Oral  ?PainSc:   ? ? ?  ?  ?  ?  ?  ?  ? ?Lynda Rainwater ? ? ? ? ?

## 2021-09-15 NOTE — TOC Initial Note (Signed)
Transition of Care (TOC) - Initial/Assessment Note  ? ? ?Patient Details  ?Name: Frederick Pham ?MRN: [id number] ?Date of Birth: 1950/09/04 ? ?Transition of Care (TOC) CM/SW Contact:    ?Kearstyn Avitia, LCSW ?Phone Number: ?09/15/2021, 3:56 PM ? ?Clinical Narrative:                 ?Met with pt's wife to review dc planning needs.  Pt lying in bed and very disengaged.  Wife notes that he is frustrated with his situation having just been through knee surgery last month.  Wife feels very strongly that pt will need SNF rehab now and pt is reluctantly agreeable with her "at times".  MD and therapies are recommending SNF rehab plan and will begin bed search. ? ?Expected Discharge Plan: [place] ?Barriers to Discharge: Continued Medical Work up, SNF Pending bed offer ? ? ?Patient Goals and CMS Choice ?  ?  ?  ? ?Expected Discharge Plan and Services ?Expected Discharge Plan: [place] ?In-house Referral: Clinical Social Work ?  ?Post Acute Care Choice: [place] ?Living arrangements for the past 2 months: [place] ?                ?  ?  ?  ?  ?  ?  ?  ?  ?  ?  ? ?Prior Living Arrangements/Services ?Living arrangements for the past 2 months: [place] ?Lives with:: Spouse ?Patient language and need for interpreter reviewed:: Yes ?Do you feel safe going back to the place where you live?: Yes      ?Need for Family Participation in Patient Care: Yes (Comment) ?Care giver support system in place?: Yes (comment) ?  ?Criminal Activity/Legal Involvement Pertinent to Current Situation/Hospitalization: No - Comment as needed ? ?Activities of Daily Living ?Home Assistive Devices/Equipment: Eyeglasses, Hearing aid, Gilford Rile (specify type) ?ADL Screening (condition at time of admission) ?Patient's cognitive ability adequate to safely complete daily activities?: Yes ?Is the patient deaf or have difficulty hearing?: Yes ?Does the patient have difficulty seeing, even when wearing  glasses/contacts?: No ?Does the patient have difficulty concentrating, remembering, or making decisions?: No ?Patient able to express need for assistance with ADLs?: Yes ?Does the patient have difficulty dressing or bathing?: No ?Independently performs ADLs?: No ?Communication: Independent ?Dressing (OT): Needs assistance ?Is this a change from baseline?: Change from baseline, expected to last >3 days ?Grooming: Independent ?Feeding: Independent ?Bathing: Needs assistance ?Is this a change from baseline?: Change from baseline, expected to last >3 days ?Toileting: Needs assistance ?Is this a change from baseline?: Change from baseline, expected to last >3days ?In/Out Bed: Needs assistance ?Is this a change from baseline?: Change from baseline, expected to last >3 days ?Walks in Home: Needs assistance ?Is this a change from baseline?: Change from baseline, expected to last >3 days ?Does the patient have difficulty walking or climbing stairs?: Yes ?Weakness of Legs: Both ?Weakness of Arms/Hands: None ? ?Permission Sought/Granted ?Permission sought to share information with : Facility Sport and exercise psychologist, Family Supports ?Permission granted to share information with : Yes, Verbal Permission Granted ? Share Information with NAME: Zykee Avakian ?   ? Permission granted to share info w Relationship: spouse ? Permission granted to share info w Contact Information: [phone] ? ?Emotional Assessment ?Appearance:: Appears stated age ?Attitude/Demeanor/Rapport: Lethargic ?  ?Orientation: : Oriented to Self, Oriented to Place ?Alcohol / Substance Use: Not Applicable ?Psych Involvement: No (comment) ? ?Admission diagnosis:  Closed fracture of left hip, initial encounter (  [place]) [S72.002A] ?Closed left hip fracture, initial encounter ([place]) [S72.002A] ?Patient Active Problem List  ? Diagnosis Date Noted  ? Delirium 09/14/2021  ? Leukocytosis 09/14/2021  ? Fall at nursing home 09/13/2021  ? Anemia of chronic disease 09/13/2021   ? Swelling of right lower extremity 09/13/2021  ? Fracture of femoral neck, left ([place]) 09/12/2021  ? OA (osteoarthritis) of knee 09/05/2021  ? Osteoarthritis of right knee 09/05/2021  ? Atypical mole 04/22/2021  ? Chronic lymphocytic leukemia, Rai stage I ([place]) 02/08/2021  ? Thrombocytopenia ([place]) 02/08/2021  ? Osteoarthritis of right knee s/p TKA on 09/05/2021 01/14/2021  ? Nocturia 01/14/2021  ? Onychomycosis 01/14/2021  ? Tremor of left hand 01/14/2021  ? ?PCP:  Yvonna Alanis, NP ?Pharmacy:   ?[facility] ?[address] ?[place] [address] ?Phone: [phone] Fax: [phone] ? ? ? ? ?Social Determinants of Health (SDOH) Interventions ?  ? ?Readmission Risk Interventions ?   ? View : No data to display.  ?  ?  ?  ? ? ? ?

## 2021-09-16 DIAGNOSIS — R410 Disorientation, unspecified: Secondary | ICD-10-CM | POA: Diagnosis not present

## 2021-09-16 DIAGNOSIS — S72002A Fracture of unspecified part of neck of left femur, initial encounter for closed fracture: Secondary | ICD-10-CM | POA: Diagnosis not present

## 2021-09-16 DIAGNOSIS — W19XXXA Unspecified fall, initial encounter: Secondary | ICD-10-CM

## 2021-09-16 DIAGNOSIS — C9110 Chronic lymphocytic leukemia of B-cell type not having achieved remission: Secondary | ICD-10-CM | POA: Diagnosis not present

## 2021-09-16 DIAGNOSIS — D638 Anemia in other chronic diseases classified elsewhere: Secondary | ICD-10-CM | POA: Diagnosis not present

## 2021-09-16 LAB — CBC
HCT: 26.1 % — ABNORMAL LOW (ref 39.0–52.0)
Hemoglobin: 8.2 g/dL — ABNORMAL LOW (ref 13.0–17.0)
MCH: 30.9 pg (ref 26.0–34.0)
MCHC: 31.4 g/dL (ref 30.0–36.0)
MCV: 98.5 fL (ref 80.0–100.0)
Platelets: 173 10*3/uL (ref 150–400)
RBC: 2.65 MIL/uL — ABNORMAL LOW (ref 4.22–5.81)
RDW: 14.1 % (ref 11.5–15.5)
WBC: 59.4 10*3/uL (ref 4.0–10.5)
nRBC: 0 % (ref 0.0–0.2)

## 2021-09-16 LAB — BASIC METABOLIC PANEL
Anion gap: 7 (ref 5–15)
BUN: 29 mg/dL — ABNORMAL HIGH (ref 8–23)
CO2: 28 mmol/L (ref 22–32)
Calcium: 8.3 mg/dL — ABNORMAL LOW (ref 8.9–10.3)
Chloride: 108 mmol/L (ref 98–111)
Creatinine, Ser: 0.89 mg/dL (ref 0.61–1.24)
GFR, Estimated: >60 mL/min (ref >60)
Glucose, Bld: 136 mg/dL — ABNORMAL HIGH (ref 70–99)
Potassium: 3.5 mmol/L (ref 3.5–5.1)
Sodium: 143 mmol/L (ref 135–145)

## 2021-09-16 MED ORDER — RIVAROXABAN 10 MG PO TABS: 10.0000 mg | ORAL_TABLET | Freq: Every day | ORAL | 0 refills | Status: AC

## 2021-09-16 MED ORDER — METHOCARBAMOL 500 MG PO TABS: 500.0000 mg | ORAL_TABLET | Freq: Four times a day (QID) | ORAL | 0 refills | Status: DC | PRN

## 2021-09-16 MED ORDER — ONDANSETRON HCL 4 MG PO TABS: 4.0000 mg | ORAL_TABLET | Freq: Four times a day (QID) | ORAL | 0 refills | Status: DC | PRN

## 2021-09-16 MED ORDER — ACETAMINOPHEN 325 MG PO TABS: 325.0000 mg | ORAL_TABLET | Freq: Four times a day (QID) | ORAL | 0 refills | Status: DC | PRN

## 2021-09-16 NOTE — Progress Notes (Signed)
Physical Therapy Treatment ?Patient Details ?Name: Frederick Pham ?MRN: [id number] ?DOB: [birth year] ?Today's Date: 09/16/2021 ? ? ?History of Present Illness 71 y.o. male admitted 09/12/21 with L femoral neck fx, s/p AA-THA on 09/14/21. Pt had R TKA on 09/05/21, fell at home while doing standing hip Abduction for his TKA HEP. PMH includes CLL, OA. ? ?  ?PT Comments  ? ? Pt required moderate assistance for sit to stand to power up, then required mod A for standing blance due to posterior lean. He ambulated 98' with RW, no loss of balance.  Performed BLE exercises. Pt continues to have some short term memory deficits and requires repeated cues to follow commands but is pleasant and puts forth good effort.  ?  ?Recommendations for follow up therapy are one component of a multi-disciplinary discharge planning process, led by the attending physician.  Recommendations may be updated based on patient status, additional functional criteria and insurance authorization. ? ?Follow Up Recommendations ? Skilled nursing-short term rehab (<3 hours/day) ?  ?  ?Assistance Recommended at Discharge Frequent or constant Supervision/Assistance  ?Patient can return home with the following A little help with walking and/or transfers;A little help with bathing/dressing/bathroom;Assistance with cooking/housework;Direct supervision/assist for medications management;Assist for transportation;Help with stairs or ramp for entrance ?  ?Equipment Recommendations ? None recommended by PT  ?  ?Recommendations for Other Services   ? ? ?  ?Precautions / Restrictions Precautions ?Precautions: Fall ?Precaution Comments: fell at home while doing TKA exercises ?Restrictions ?Weight Bearing Restrictions: No ?LLE Weight Bearing: Weight bearing as tolerated  ?  ? ?Mobility ? Bed Mobility ?Overal bed mobility: Needs Assistance ?Bed Mobility: Supine to Sit ?  ?  ?Supine to sit: Min assist ?  ?  ?General bed mobility comments: min A to raise trunk and scoot hips to  EOB ?  ? ?Transfers ?Overall transfer level: Needs assistance ?Equipment used: Rolling walker (2 wheels) ?Transfers: Sit to/from Stand ?Sit to Stand: Mod assist ?  ?  ?  ?  ?  ?General transfer comment: VCs hand placement, mod A to power up and to maintain balance due to moderate posterior lean initially in standing ?  ? ?Ambulation/Gait ?Ambulation/Gait assistance: Min guard ?Gait Distance (Feet): 90 Feet ?Assistive device: Rolling walker (2 wheels) ?Gait Pattern/deviations: Step-to pattern, Decreased stride length, Shuffle ?Gait velocity: decreased ?  ?  ?General Gait Details: no loss of balance, shuffle initially which then resolved ? ? ?Stairs ?  ?  ?  ?  ?  ? ? ?Wheelchair Mobility ?  ? ?Modified Rankin (Stroke Patients Only) ?  ? ? ?  ?Balance   ?Sitting-balance support: Feet supported, Single extremity supported ?Sitting balance-Leahy Scale: Good ?  ?  ?  ?Standing balance-Leahy Scale: Poor ?Standing balance comment: moderate posterior lean initially in standing requiring mod A to prevent loss of balance, no loss of balance with ambulation ?  ?  ?  ?  ?  ?  ?  ?  ?  ?  ?  ?  ? ?  ?Cognition Arousal/Alertness: Awake/alert ?Behavior During Therapy: [facility] for tasks assessed/performed ?Overall Cognitive Status: Impaired/Different from baseline ?  ?  ?  ?  ?  ?  ?  ?  ?  ?Orientation Level: Situation, Time ?  ?Memory: Decreased short-term memory ?  ?  ?  ?  ?General Comments: wife reports confusion was not present prior to TKA 09/05/21, she notices that its worse with use of narcotics (pt only had tylenol this morning),  he can follow 1 step commands with increased time and manual cues ?  ?  ? ?  ?Exercises Total Joint Exercises ?Ankle Circles/Pumps: AROM, Both, Supine, 15 reps ?Quad Sets: AROM, 10 reps, Supine, Both (5 sec hold; cues) ?Short Arc Quad: AROM, Both, 10 reps, Supine ?Heel Slides: AROM, Supine, Both, 15 reps ?Hip ABduction/ADduction: AROM, 10 reps, Supine, Both ?Straight Leg Raises: AROM, Right, 10 reps,  Supine ?Long Arc Quad: AROM, Both, Seated, 10 reps ?Knee Flexion: AROM, Right, 10 reps, Seated ?Goniometric ROM: 5-100* AAROM R knee ? ?  ?General Comments   ?  ?  ? ?Pertinent Vitals/Pain Pain Assessment ?Pain Score: 4  ?Pain Location: L hip and thigh with walking ?Pain Descriptors / Indicators: Grimacing, Sore ?Pain Intervention(s): Limited activity within patient's tolerance, Monitored during session, Premedicated before session, Ice applied  ? ? ?Home Living   ?  ?  ?  ?  ?  ?  ?  ?  ?  ?   ?  ?Prior Function    ?  ?  ?   ? 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 ?  ?Frequency ? ? ? Min 5X/week ? ? ? ?  ?PT Plan Current plan remains appropriate  ? ? ?Co-evaluation   ?  ?  ?  ?  ? ?  ?AM-PAC PT "6 Clicks" Mobility   ?Outcome Measure ? Help needed turning from your back to your side while in a flat bed without using bedrails?: A Little ?Help needed moving from lying on your back to sitting on the side of a flat bed without using bedrails?: A Little ?Help needed moving to and from a bed to a chair (including a wheelchair)?: A Little ?Help needed standing up from a chair using your arms (e.g., wheelchair or bedside chair)?: A Little ?Help needed to walk in hospital room?: A Little ?Help needed climbing 3-5 steps with a railing? : A Lot ?6 Click Score: 17 ? ?  ?End of Session Equipment Utilized During Treatment: Gait belt ?Activity Tolerance: Patient tolerated treatment well ?Patient left: in chair;with call bell/phone within reach;with family/visitor present;with chair alarm set ?Nurse Communication: Mobility status ?PT Visit Diagnosis: Difficulty in walking, not elsewhere classified (R26.2);Pain;Unsteadiness on feet (R26.81) ?Pain - Right/Left:  Left ?Pain - part of body: Hip ?  ? ? ?Time: 3646-8032 ?PT Time Calculation (min) (ACUTE ONLY): 35 min ? ?Charges:  $Gait Training: 8-22 mins ?$Therapeutic Exercise: 8-22 mins          ?          ? ?Philomena Doheny PT 09/16/2021  ?Acute Rehabilitation Services ?Pager [phone] ?Office [phone] ? ? ?

## 2021-09-16 NOTE — Progress Notes (Addendum)
?PROGRESS NOTE ? ? ? ?EVERTTE SONES  HFG:[id number] DOB: 04-30-1951 DOA: 09/12/2021 ?PCP: Yvonna Alanis, NP  ? ?Brief Narrative:  ?71 year old male with history of CLL and recent right TKA for osteoarthritis presented with accidental fall while working with therapy.  He was found to have left hip fracture.  He underwent left total hip arthroplasty on 09/14/2021. ?Assessment & Plan: ?  ?Closed left hip fracture secondary to accidental mechanical fall ?-Orthopedics following: Status post left total hip arthroplasty on 09/14/2021 ?-Continue pain management.  Use Tylenol and tramadol if needed.  Fall precautions.  DVT prophylaxis as per orthopedics: Currently on Xarelto ?-PT recommends SNF placement.  Social worker following. ? ?Delirium ?Agitation ?-Family is concerned that this might be from IV morphine/narcotic ?-Monitor mental status.  Will avoid IV morphine/narcotics. ?-Delirium has much improved as per patient's wife ? ?CLL ?Chronic leukocytosis ?-Follows up with Dr. Dorsey/oncology: Latest plan per oncology note on 08/24/2021 was for continued observation ?-WBCs 59.4 today. ? ?Status post right TKA on 09/05/2021 for osteoarthritis of right knee ?-Has some right lower extremity swelling: Venous Doppler negative for DVT. ?-Orthopedic surgery following ? ?Anemia of chronic disease ?-H&H stable on 09/12/2021. ? ? ?DVT prophylaxis: SCDs ?Code Status: Full ?Family Communication: Wife at bedside on 09/16/2021 ?Disposition Plan: ?Status is: Inpatient ?Remains inpatient appropriate because: Of severity of illness.  Need for SNF placement ? ? ? ?Consultants: Orthopedics ? ?Procedures: left total hip arthroplasty on 09/14/2021 ? ?Antimicrobials: Perioperative ? ? ?Subjective: ?Patient seen and examined at bedside.  No fever, vomiting, seizures or agitation reported.  Wife reports that patient slept better last night. ?Objective: ?Vitals:  ? 09/15/21 1440 09/15/21 1837 09/15/21 2038 09/16/21 0519  ?BP: 101/64 116/66 120/69 121/61   ?Pulse: 96 80 90 79  ?Resp: '16 16 17 17  '$ ?Temp: 99 ?F (37.2 ?C) 98.1 ?F (36.7 ?C) 98.3 ?F (36.8 ?C) 98.7 ?F (37.1 ?C)  ?TempSrc: Oral Oral Oral Oral  ?SpO2: 100% 100% 100% 100%  ?Weight:      ?Height:      ? ? ?Intake/Output Summary (Last 24 hours) at 09/16/2021 0808 ?Last data filed at 09/15/2021 1800 ?Gross per 24 hour  ?Intake 480 ml  ?Output --  ?Net 480 ml  ? ? ?Filed Weights  ? 09/13/21 0147  ?Weight: 68.2 kg  ? ? ?Examination: ? ?General exam: Currently on room air.  No acute distress.  Sleeping. No signs of agitation currently. ?respiratory system: Bilateral decreased breath sounds at bases  ?cardiovascular system: S1-S2 heard; rate controlled currently ?gastrointestinal system: Abdomen is mildly distended; soft and nontender.  Normal bowel sounds are heard  ?extremities: No cyanosis; mild lower extremity edema present  ? ?Data Reviewed: I have personally reviewed following labs and imaging studies ? ?CBC: ?Recent Labs  ?Lab 09/12/21 ?2341 09/15/21 ?0802 09/16/21 ?0317  ?WBC 50.9* 55.3* 59.4*  ?NEUTROABS 2.5  --   --   ?HGB 9.8* 8.9* 8.2*  ?HCT 30.1* 27.7* 26.1*  ?MCV 95.9 98.6 98.5  ?PLT 163 169 173  ? ? ?Basic Metabolic Panel: ?Recent Labs  ?Lab 09/12/21 ?2341 09/15/21 ?2336 09/16/21 ?0317  ?NA 137 142 143  ?K 4.5 4.3 3.5  ?CL 101 108 108  ?CO2 '28 28 28  '$ ?GLUCOSE 125* 158* 136*  ?BUN 26* 26* 29*  ?CREATININE 1.10 0.93 0.89  ?CALCIUM 8.4* 8.4* 8.3*  ? ? ?GFR: ?Estimated Creatinine Clearance: 69.7 mL/min (by C-G formula based on SCr of 0.89 mg/dL). ?Liver Function Tests: ?No results for input(s): AST, ALT,  ALKPHOS, BILITOT, PROT, ALBUMIN in the last 168 hours. ?No results for input(s): LIPASE, AMYLASE in the last 168 hours. ?No results for input(s): AMMONIA in the last 168 hours. ?Coagulation Profile: ?No results for input(s): INR, PROTIME in the last 168 hours. ?Cardiac Enzymes: ?No results for input(s): CKTOTAL, CKMB, CKMBINDEX, TROPONINI in the last 168 hours. ?BNP (last 3 results) ?No results for input(s):  PROBNP in the last 8760 hours. ?HbA1C: ?No results for input(s): HGBA1C in the last 72 hours. ?CBG: ?No results for input(s): GLUCAP in the last 168 hours. ?Lipid Profile: ?No results for input(s): CHOL, HDL, LDLCALC, TRIG, CHOLHDL, LDLDIRECT in the last 72 hours. ?Thyroid Function Tests: ?No results for input(s): TSH, T4TOTAL, FREET4, T3FREE, THYROIDAB in the last 72 hours. ?Anemia Panel: ?No results for input(s): VITAMINB12, FOLATE, FERRITIN, TIBC, IRON, RETICCTPCT in the last 72 hours. ?Sepsis Labs: ?No results for input(s): PROCALCITON, LATICACIDVEN in the last 168 hours. ? ?Recent Results (from the past 240 hour(s))  ?Surgical pcr screen     Status: None  ? Collection Time: 09/13/21  6:17 AM  ? Specimen: Nasal Mucosa; Nasal Swab  ?Result Value Ref Range Status  ? MRSA, PCR NEGATIVE NEGATIVE Final  ? Staphylococcus aureus NEGATIVE NEGATIVE Final  ?  Comment: (NOTE) ?The Xpert SA Assay (FDA approved for NASAL specimens in patients 74 ?years of age and older), is one component of a comprehensive ?surveillance program. It is not intended to diagnose infection nor to ?guide or monitor treatment. ?Performed at [facility], [address] ?[place], [place] [address] ?  ? ?  ? ? ? ? ? ?Radiology Studies: ?DG Pelvis Portable ? ?Result Date: 09/14/2021 ?CLINICAL DATA:  Status post left hip arthroplasty EXAM: PORTABLE PELVIS 1-2 VIEWS COMPARISON:  None. FINDINGS: There is evidence of recent left hip arthroplasty. There are pockets of air in the soft tissues. No fracture is seen. IMPRESSION: Status post left hip arthroplasty. Electronically Signed   By: Elmer Picker M.D.   On: 09/14/2021 19:11  ? ?DG C-Arm 1-60 Min-No Report ? ?Result Date: 09/14/2021 ?Fluoroscopy was utilized by the requesting physician.  No radiographic interpretation.  ? ?DG HIP PORT UNILAT WITH PELVIS 1V LEFT ? ?Result Date: 09/14/2021 ?CLINICAL DATA:  Left hip fracture. EXAM: DG HIP (WITH OR WITHOUT PELVIS) 1V PORT LEFT  COMPARISON:  Radiographs 09/12/2021 FINDINGS: Intraoperative spot films demonstrate placement of a total left hip arthroplasty. The components are well seated. No complicating features are identified. IMPRESSION: Well seated components of a total left hip arthroplasty. Electronically Signed   By: Marijo Sanes M.D.   On: 09/14/2021 18:37   ? ? ? ? ? ?Scheduled Meds: ? Chlorhexidine Gluconate Cloth  6 each Topical Daily  ? docusate sodium  100 mg Oral BID  ? feeding supplement  237 mL Oral BID BM  ? rivaroxaban  10 mg Oral Q breakfast  ? ?Continuous Infusions: ? methocarbamol (ROBAXIN) IV    ? ? ? ? ? ? ? ? ?Aline August, MD ?[facility] ?09/16/2021, 8:08 AM  ? ?

## 2021-09-16 NOTE — Progress Notes (Addendum)
? ?  Subjective: ?2 Days Post-Op Procedure(s) (LRB): ?TOTAL HIP ARTHROPLASTY ANTERIOR APPROACH (Left) ?Patient reports pain as mild.   ?Patient seen in rounds with Dr. Wynelle Link. ?Patient did well with therapy yesterday and confusion is improving. Denies chest pain or SOB. Voiding without difficulty.  ?Plan is to go Home after hospital stay. ? ?Objective: ?Vital signs in last 24 hours: ?Temp:  [98.1 ?F (36.7 ?C)-99.2 ?F (37.3 ?C)] 98.7 ?F (37.1 ?C) (04/07 0519) ?Pulse Rate:  [78-96] 79 (04/07 0519) ?Resp:  [14-17] 17 (04/07 0519) ?BP: (101-128)/(61-85) 121/61 (04/07 0519) ?SpO2:  [97 %-100 %] 100 % (04/07 0519) ? ?Intake/Output from previous day: ? ?Intake/Output Summary (Last 24 hours) at 09/16/2021 4403 ?Last data filed at 09/15/2021 1800 ?Gross per 24 hour  ?Intake 480 ml  ?Output --  ?Net 480 ml  ?  ?Intake/Output this shift: ?No intake/output data recorded. ? ?Labs: ?Recent Labs  ?  09/15/21 ?4742 09/16/21 ?0317  ?HGB 8.9* 8.2*  ? ?Recent Labs  ?  09/15/21 ?5956 09/16/21 ?0317  ?WBC 55.3* 59.4*  ?RBC 2.81* 2.65*  ?HCT 27.7* 26.1*  ?PLT 169 173  ? ?Recent Labs  ?  09/15/21 ?3875 09/16/21 ?0317  ?NA 142 143  ?K 4.3 3.5  ?CL 108 108  ?CO2 28 28  ?BUN 26* 29*  ?CREATININE 0.93 0.89  ?GLUCOSE 158* 136*  ?CALCIUM 8.4* 8.3*  ? ?No results for input(s): LABPT, INR in the last 72 hours. ? ?Exam: ?General - Patient is Alert and Oriented ?Extremity - Neurologically intact ?Neurovascular intact ?Sensation intact distally ?Dorsiflexion/Plantar flexion intact ?Dressing/Incision - clean, dry, no drainage ?Motor Function - intact, moving foot and toes well on exam.  ? ?Past Medical History:  ?Diagnosis Date  ? Arthritis   ? CLL (chronic lymphocytic leukemia) ([place])   ? ? ?Assessment/Plan: ?2 Days Post-Op Procedure(s) (LRB): ?TOTAL HIP ARTHROPLASTY ANTERIOR APPROACH (Left) ?Principal Problem: ?  Fracture of femoral neck, left ([place]) ?Active Problems: ?  Osteoarthritis of right knee s/p TKA on 09/05/2021 ?  Chronic lymphocytic leukemia,  Rai stage I ([place]) ?  Fall at nursing home ?  Anemia of chronic disease ?  Swelling of right lower extremity ?  Delirium ?  Leukocytosis ? ?Estimated body mass index is 24.27 kg/m? as calculated from the following: ?  Height as of this encounter: '5\' 6"'$  (1.676 m). ?  Weight as of this encounter: 68.2 kg. ?Up with therapy ? ?DVT Prophylaxis - Xarelto ?Weight-bearing as tolerated ? ?Discontinued all narcotic medications due to altered mental status, improved today. Will discharge with tylenol for pain control. ? ?Plan for discharge to SNF once bed offer received. Follow-up in the office in 2 weeks ? ?The PDMP database was reviewed today prior to any opioid medications being prescribed to this patient. ? ? ?Theresa Duty, PA-C ?Orthopedic Surgery ?[phone] ?09/16/2021, 7:22 AM ? ?

## 2021-09-16 NOTE — NC FL2 (Signed)
?[place] MEDICAID FL2 LEVEL OF CARE SCREENING TOOL  ?  ? ?IDENTIFICATION  ?Patient Name: ?Frederick Pham [id number] Birthdate: July 23, 1950 Sex: male Admission Date (Current Location): ?09/12/2021  ?[place] and [place] Number: ? [place] ?  Facility and Address:  ?[facility],  [address] [place], [place] ?     Provider Number: ?[id number]  ?Attending Physician Name and Address:  ?Aline August, MD ? Relative Name and Phone Number:  ?wife, Jaxston Chohan [phone] ?   ?Current Level of Care: ?Hospital Recommended Level of Care: ?[place] Prior Approval Number: ?  ? ?Date Approved/Denied: ?  PASRR Number: ?[id number] A ? ?Discharge Plan: ?SNF ?  ? ?Current Diagnoses: ?Patient Active Problem List  ? Diagnosis Date Noted  ? Delirium 09/14/2021  ? Leukocytosis 09/14/2021  ? Fall at nursing home 09/13/2021  ? Anemia of chronic disease 09/13/2021  ? Swelling of right lower extremity 09/13/2021  ? Fracture of femoral neck, left ([place]) 09/12/2021  ? OA (osteoarthritis) of knee 09/05/2021  ? Osteoarthritis of right knee 09/05/2021  ? Atypical mole 04/22/2021  ? Chronic lymphocytic leukemia, Rai stage I ([place]) 02/08/2021  ? Thrombocytopenia ([place]) 02/08/2021  ? Osteoarthritis of right knee s/p TKA on 09/05/2021 01/14/2021  ? Nocturia 01/14/2021  ? Onychomycosis 01/14/2021  ? Tremor of left hand 01/14/2021  ? ? ?Orientation RESPIRATION BLADDER Height & Weight   ?  ?Self, Time, Situation, Place ? Normal Continent Weight: 150 lb 5.7 oz (68.2 kg) ?Height:  '5\' 6"'$  (167.6 cm)  ?BEHAVIORAL SYMPTOMS/MOOD NEUROLOGICAL BOWEL NUTRITION STATUS  ?    Continent    ?AMBULATORY STATUS COMMUNICATION OF NEEDS Skin   ?Limited Assist Verbally Normal (surgical incision only) ?  ?  ?  ?    ?     ?     ? ? ?Personal Care Assistance Level of Assistance  ?Bathing, Dressing Bathing Assistance: Limited assistance ?  ?Dressing Assistance: Limited assistance ?   ? ?Functional Limitations Info  ?    ?  ?   ? ? ?SPECIAL CARE FACTORS  FREQUENCY  ?OT (By licensed OT), PT (By licensed PT)   ?  ?PT Frequency: 5x/wk ?OT Frequency: 5x/wk ?  ?  ?  ?   ? ? ?Contractures Contractures Info: Not present  ? ? ?Additional Factors Info  ?Code Status, Allergies Code Status Info: Full ?Allergies Info: morphine ?  ?  ?  ?   ? ?Current Medications (09/16/2021):  This is the current hospital active medication list ?Current Facility-Administered Medications  ?Medication Dose Route Frequency Provider Last Rate Last Admin  ? acetaminophen (TYLENOL) tablet 325-650 mg  325-650 mg Oral Q6H PRN Edmisten, Kristie L, PA   650 mg at 09/16/21 0631  ? bisacodyl (DULCOLAX) suppository 10 mg  10 mg Rectal Daily PRN Gaynelle Arabian, MD      ? Chlorhexidine Gluconate Cloth 2 % PADS 6 each  6 each Topical Daily Aline August, MD   6 each at 09/15/21 0944  ? docusate sodium (COLACE) capsule 100 mg  100 mg Oral BID Gaynelle Arabian, MD   100 mg at 09/15/21 7673  ? feeding supplement (ENSURE SURGERY) liquid 237 mL  237 mL Oral BID BM Aluisio, Pilar Plate, MD   237 mL at 09/15/21 1859  ? haloperidol lactate (HALDOL) injection 2-5 mg  2-5 mg Intravenous Q6H PRN Aluisio, Pilar Plate, MD      ? menthol-cetylpyridinium (CEPACOL) lozenge 3 mg  1 lozenge Oral PRN Gaynelle Arabian, MD      ?  Or  ? phenol (CHLORASEPTIC) mouth spray 1 spray  1 spray Mouth/Throat PRN Aluisio, Pilar Plate, MD      ? methocarbamol (ROBAXIN) tablet 500 mg  500 mg Oral Q6H PRN Edmisten, Kristie L, PA      ? Or  ? methocarbamol (ROBAXIN) 500 mg in dextrose 5 % 50 mL IVPB  500 mg Intravenous Q6H PRN Edmisten, Kristie L, PA      ? metoCLOPramide (REGLAN) tablet 5-10 mg  5-10 mg Oral Q8H PRN Edmisten, Kristie L, PA      ? Or  ? metoCLOPramide (REGLAN) injection 5-10 mg  5-10 mg Intravenous Q8H PRN Edmisten, Kristie L, PA      ? ondansetron (ZOFRAN) tablet 4 mg  4 mg Oral Q6H PRN Edmisten, Kristie L, PA      ? Or  ? ondansetron (ZOFRAN) injection 4 mg  4 mg Intravenous Q6H PRN Edmisten, Kristie L, PA      ? polyethylene glycol (MIRALAX /  GLYCOLAX) packet 17 g  17 g Oral Daily PRN Aluisio, Pilar Plate, MD      ? rivaroxaban Alveda Reasons) tablet 10 mg  10 mg Oral Q breakfast Gaynelle Arabian, MD   10 mg at 09/15/21 5638  ? ? ? ?Discharge Medications: ?Please see discharge summary for a list of discharge medications. ? ?Relevant Imaging Results: ? ?Relevant Lab Results: ? ? ?Additional Information ?SS# [id number] ? ?Jacion Dismore, LCSW ? ? ? ? ?

## 2021-09-16 NOTE — Plan of Care (Signed)
Problem: Clinical Measurements: ?Goal: Ability to maintain clinical measurements within normal limits will improve ?Outcome: Progressing ?  ?Problem: Safety: ?Goal: Ability to remain free from injury will improve ?Outcome: Progressing ?  ?Problem: Activity: ?Goal: Risk for activity intolerance will decrease ?Outcome: Progressing ?  ?[name], RN ?09/16/21 ?8:04 PM ? ?

## 2021-09-16 NOTE — TOC Progression Note (Signed)
Transition of Care (TOC) - Progression Note  ? ? ?Patient Details  ?Name: Frederick Pham ?MRN: [id number] ?Date of Birth: 06/15/1950 ? ?Transition of Care (TOC) CM/SW Contact  ?Jonothan Heberle, LCSW ?Phone Number: ?09/16/2021, 2:16 PM ? ?Clinical Narrative:    ?Have reviewed SNF bed offers with pt and spouse and they have accepted bed at [facility] who can admit pt tomorrow.  TOC will set up PTAR tomorrow. ? ? ?Expected Discharge Plan: [place] ?Barriers to Discharge: Continued Medical Work up, SNF Pending bed offer ? ?Expected Discharge Plan and Services ?Expected Discharge Plan: [place] ?In-house Referral: Clinical Social Work ?  ?Post Acute Care Choice: [place] ?Living arrangements for the past 2 months: [place] ?Expected Discharge Date: 09/16/21               ?  ?  ?  ?  ?  ?  ?  ?  ?  ?  ? ? ?Social Determinants of Health (SDOH) Interventions ?  ? ?Readmission Risk Interventions ?   ? View : No data to display.  ?  ?  ?  ? ? ?

## 2021-09-17 DIAGNOSIS — C9110 Chronic lymphocytic leukemia of B-cell type not having achieved remission: Secondary | ICD-10-CM | POA: Diagnosis not present

## 2021-09-17 DIAGNOSIS — D72829 Elevated white blood cell count, unspecified: Secondary | ICD-10-CM | POA: Diagnosis not present

## 2021-09-17 DIAGNOSIS — S72002A Fracture of unspecified part of neck of left femur, initial encounter for closed fracture: Secondary | ICD-10-CM | POA: Diagnosis not present

## 2021-09-17 DIAGNOSIS — M25561 Pain in right knee: Secondary | ICD-10-CM | POA: Diagnosis not present

## 2021-09-17 LAB — CBC
HCT: 25.5 % — ABNORMAL LOW (ref 39.0–52.0)
Hemoglobin: 8 g/dL — ABNORMAL LOW (ref 13.0–17.0)
MCH: 31.1 pg (ref 26.0–34.0)
MCHC: 31.4 g/dL (ref 30.0–36.0)
MCV: 99.2 fL (ref 80.0–100.0)
Platelets: 175 10*3/uL (ref 150–400)
RBC: 2.57 MIL/uL — ABNORMAL LOW (ref 4.22–5.81)
RDW: 14.5 % (ref 11.5–15.5)
WBC: 50.3 10*3/uL (ref 4.0–10.5)
nRBC: 0 % (ref 0.0–0.2)

## 2021-09-17 NOTE — Progress Notes (Signed)
Called report to [employer], LPN at [facility].  ?

## 2021-09-17 NOTE — Discharge Summary (Signed)
Physician Discharge Summary  ?Frederick Pham LSL:[id number] DOB: 11-22-1950 DOA: 09/12/2021 ? ?PCP: Yvonna Alanis, NP ? ?Admit date: 09/12/2021 ?Discharge date: 09/17/2021 ? ?Admitted From: Home ?Disposition: SNF ? ?Recommendations for Outpatient Follow-up:  ?Follow up with SNF provider at earliest convenience ?Outpatient follow-up with orthopedics.  Wound care/pain management/DVT prophylaxis as per orthopedics recommendations.  Activity as per orthopedic/PT recommendations  ?Might benefit from outpatient neurology evaluation and follow-up ?follow up in ED if symptoms worsen or new appear ? ? ?Home Health: No ?Equipment/Devices: None ? ?Discharge Condition: Stable ?CODE STATUS: Full ?Diet recommendation: Heart healthy ? ?Brief/Interim Summary: ?71 year old male with history of CLL and recent right TKA for osteoarthritis presented with accidental fall while working with therapy.  He was found to have left hip fracture.  He underwent left total hip arthroplasty on 09/14/2021.  Subsequently, PT recommended SNF placement.  He will be discharged to SNF once bed is available.  Outpatient follow-up with orthopedics. ? ?Discharge Diagnoses:  ? ?Closed left hip fracture secondary to accidental mechanical fall ?-Orthopedics following: Status post left total hip arthroplasty on 09/14/2021 ?-Continue pain management.  Use Tylenol and tramadol if needed.  Fall precautions.  DVT prophylaxis as per orthopedics: Currently on Xarelto ?-PT recommends SNF placement.  He will be discharged to SNF once bed is available.  Outpatient follow-up with orthopedics. ?  ?Delirium ?Agitation ?-Possibly from narcotic/IV morphine use.  Avoid narcotics as much as possible. ?-Still slow to respond but mental status is much improved.   ?-Patient might benefit from outpatient neurology evaluation and follow-up to see if there are any other underlying neurological problems.   ? ?CLL ?Chronic leukocytosis ?-Follows up with Dr. Dorsey/oncology: Latest plan per  oncology note on 08/24/2021 was for continued observation ?-WBCs 50.3 today. ?-Outpatient follow-up with oncology ?  ?Status post right TKA on 09/05/2021 for osteoarthritis of right knee ?-Has some right lower extremity swelling: Venous Doppler negative for DVT. ?-Follow-up with orthopedics ? ?Anemia of chronic disease ?-H&H stable.  Outpatient follow-up ? ?Discharge Instructions ? ?Discharge Instructions   ? ? Call MD / Call 911   Complete by: As directed ?  ? If you experience chest pain or shortness of breath, CALL 911 and be transported to the hospital emergency room.  If you develope a fever above 101 F, pus (white drainage) or increased drainage or redness at the wound, or calf pain, call your surgeon's office.  ? Change dressing   Complete by: As directed ?  ? You have an adhesive waterproof bandage over the incision. Leave this in place until your first follow-up appointment. Once you remove this you will not need to place another bandage.  ? Constipation Prevention   Complete by: As directed ?  ? Drink plenty of fluids.  Prune juice may be helpful.  You may use a stool softener, such as Colace (over the counter) 100 mg twice a day.  Use MiraLax (over the counter) for constipation as needed.  ? Diet - low sodium heart healthy   Complete by: As directed ?  ? Do not sit on low chairs, stoools or toilet seats, as it may be difficult to get up from low surfaces   Complete by: As directed ?  ? Driving restrictions   Complete by: As directed ?  ? No driving for two weeks  ? Post-operative opioid taper instructions:   Complete by: As directed ?  ? POST-OPERATIVE OPIOID TAPER INSTRUCTIONS: ?It is important to wean off of your opioid medication  as soon as possible. If you do not need pain medication after your surgery it is ok to stop day one. ?Opioids include: ?Codeine, Hydrocodone(Norco, Vicodin), Oxycodone(Percocet, oxycontin) and hydromorphone amongst others.  ?Long term and even short term use of opiods can  cause: ?Increased pain response ?Dependence ?Constipation ?Depression ?Respiratory depression ?And more.  ?Withdrawal symptoms can include ?Flu like symptoms ?Nausea, vomiting ?And more ?Techniques to manage these symptoms ?Hydrate well ?Eat regular healthy meals ?Stay active ?Use relaxation techniques(deep breathing, meditating, yoga) ?Do Not substitute Alcohol to help with tapering ?If you have been on opioids for less than two weeks and do not have pain than it is ok to stop all together.  ?Plan to wean off of opioids ?This plan should start within one week post op of your joint replacement. ?Maintain the same interval or time between taking each dose and first decrease the dose.  ?Cut the total daily intake of opioids by one tablet each day ?Next start to increase the time between doses. ?The last dose that should be eliminated is the evening dose.  ? ?  ? TED hose   Complete by: As directed ?  ? Use stockings (TED hose) for three weeks on both leg(s).  You may remove them at night for sleeping.  ? Weight bearing as tolerated   Complete by: As directed ?  ? ?  ? ?Allergies as of 09/17/2021   ? ?   Reactions  ? Morphine Other (See Comments)  ? " Caused extreme confusion and combativeness "   ? ?  ? ?  ?Medication List  ?  ? ?STOP taking these medications   ? ?gabapentin 300 MG capsule ?Commonly known as: NEURONTIN ?  ?oxyCODONE 5 MG immediate release tablet ?Commonly known as: Oxy IR/ROXICODONE ?  ?traMADol 50 MG tablet ?Commonly known as: ULTRAM ?  ? ?  ? ?TAKE these medications   ? ?acetaminophen 325 MG tablet ?Commonly known as: TYLENOL ?Take 1-2 tablets (325-650 mg total) by mouth every 6 (six) hours as needed for mild pain (pain score 1-3 or temp > 100.5). ?What changed:  ?medication strength ?how much to take ?reasons to take this ?  ?methocarbamol 500 MG tablet ?Commonly known as: ROBAXIN ?Take 1 tablet (500 mg total) by mouth every 6 (six) hours as needed for muscle spasms. ?  ?ondansetron 4 MG  tablet ?Commonly known as: ZOFRAN ?Take 1 tablet (4 mg total) by mouth every 6 (six) hours as needed for nausea. ?  ?rivaroxaban 10 MG Tabs tablet ?Commonly known as: XARELTO ?Take 1 tablet (10 mg total) by mouth daily with breakfast for 19 days. Then take one 81 mg aspirin once a day for three weeks. Then discontinue aspirin. ?What changed: additional instructions ?  ? ?  ? ?  ?  ? ? ?  ?Discharge Care Instructions  ?(From admission, onward)  ?  ? ? ?  ? ?  Start     Ordered  ? 09/16/21 0000  Weight bearing as tolerated       ? 09/16/21 0734  ? 09/16/21 0000  Change dressing       ?Comments: You have an adhesive waterproof bandage over the incision. Leave this in place until your first follow-up appointment. Once you remove this you will not need to place another bandage.  ? 09/16/21 0734  ? ?  ?  ? ?  ? ? Contact information for follow-up providers   ? ? Gaynelle Arabian, MD. Schedule an appointment  as soon as possible for a visit in 2 day(s).   ?Specialty: Orthopedic Surgery ?Contact information: ?[address] ?[address] ?[place] [address] ?[phone] ? ? ?  ?  ? ?  ?  ? ? Contact information for after-discharge care   ? ? Destination   ? ? [facility]erred [facility] .   ?Service: Skilled Nursing ?Contact information: ?[address] ?[place] ?[phone] ? ?  ?  ? ?  ?  ? ?  ?  ? ?  ? ?Allergies  ?Allergen Reactions  ? Morphine Other (See Comments)  ?  " Caused extreme confusion and combativeness "   ? ? ?Consultations: ?Orthopedic surgery ? ? ?Procedures/Studies: ?DG Chest 1 View ? ?Result Date: 09/13/2021 ?CLINICAL DATA:  Fall with left hip pain EXAM: CHEST  1 VIEW COMPARISON:  CT 02/18/2021 FINDINGS: Low lung volumes. No focal opacity, pleural effusion or pneumothorax. Normal cardiomediastinal silhouette. IMPRESSION: No active disease. Electronically Signed   By: Donavan Foil M.D.   On: 09/13/2021 00:06  ? ?DG Pelvis Portable ? ?Result Date: 09/14/2021 ?CLINICAL DATA:   Status post left hip arthroplasty EXAM: PORTABLE PELVIS 1-2 VIEWS COMPARISON:  None. FINDINGS: There is evidence of recent left hip arthroplasty. There are pockets of air in the soft tissues. No fracture is seen. IMPRESSION:

## 2021-09-17 NOTE — TOC Transition Note (Signed)
Transition of Care (TOC) - CM/SW Discharge Note ? ? ?Patient Details  ?Name: Frederick Pham ?MRN: [id number] ?Date of Birth: 08-03-1950 ? ?Transition of Care (TOC) CM/SW Contact:  ?Despina Boan, LCSW ?Phone Number: ?09/17/2021, 9:32 AM ? ? ?Clinical Narrative:    ?Pt medically cleared for dc today to [facility].  Pt and wife aware and agreeable.  [facility]/ [facility] called at 9:30am.  RN to call report to [phone].  No further TOC needs. ? ? ?Final next level of care: [place] ?Barriers to Discharge: Barriers Resolved ? ? ?Patient Goals and CMS Choice ?  ?  ?  ? ?Discharge Placement ?PASRR number recieved: 09/16/21 ?           ?Patient chooses bed at: [facility] ?Patient to be transferred to facility by: [facility]/ [facility] ?Name of family member notified: wife ?Patient and family notified of of transfer: 09/17/21 ? ?Discharge Plan and Services ?In-house Referral: Clinical Social Work ?  ?Post Acute Care Choice: [facility]          ?  ?  ?  ?  ?  ?  ?  ?  ?  ?  ? ?Social Determinants of Health (SDOH) Interventions ?  ? ? ?Readmission Risk Interventions ? ?  09/17/2021  ?  9:31 AM  ?Readmission Risk Prevention Plan  ?Transportation Screening Complete  ?PCP or Specialist Appt within 5-7 Days Complete  ?Home Care Screening Complete  ? ? ? ? ? ?

## 2021-09-17 NOTE — Progress Notes (Signed)
? ?  Subjective: ?3 Days Post-Op Procedure(s) (LRB): ?TOTAL HIP ARTHROPLASTY ANTERIOR APPROACH (Left)  ?Pt c/o mild soreness in the left hip and leg currently ?Denies any new symptoms or issues ?Plan for continued therapy until SNF bed available ?Patient reports pain as mild. ? ?Objective:  ? ?VITALS:   ?Vitals:  ? 09/16/21 2006 09/17/21 0429  ?BP: 131/66 (!) 142/71  ?Pulse: 88 87  ?Resp: 18 18  ?Temp: 97.6 ?F (36.4 ?C) 98.6 ?F (37 ?C)  ?SpO2: 100% 100%  ? ? ?Pt sitting in chair comfortably in no acute distress ?Incision healing well ?Nv intact distally ?No rashes or edema distally ? ?LABS ?Recent Labs  ?  09/15/21 ?9323 09/16/21 ?5573 09/17/21 ?2202  ?HGB 8.9* 8.2* 8.0*  ?HCT 27.7* 26.1* 25.5*  ?WBC 55.3* 59.4* 50.3*  ?PLT 169 173 175  ? ? ?Recent Labs  ?  09/15/21 ?5427 09/16/21 ?0317  ?NA 142 143  ?K 4.3 3.5  ?BUN 26* 29*  ?CREATININE 0.93 0.89  ?GLUCOSE 158* 136*  ? ? ? ?Assessment/Plan: ?3 Days Post-Op Procedure(s) (LRB): ?TOTAL HIP ARTHROPLASTY ANTERIOR APPROACH (Left) ?Continue PT/OT ?Pain management  ?Plan for SNF placemend once bed available ? ? ? ?Brad Matina Rodier PA-C, MPAS ?[facility] [employer]  [facility] ?[address], [place], [place] [address] ?Phone: [phone] ?[email or web address] ?Facebook  Engineer, structural  ? ? ? ?  ?

## 2021-09-17 NOTE — Plan of Care (Signed)
  Problem: Education: Goal: Knowledge of General Education information will improve Description Including pain rating scale, medication(s)/side effects and non-pharmacologic comfort measures Outcome: Progressing   Problem: Health Behavior/Discharge Planning: Goal: Ability to manage health-related needs will improve Outcome: Progressing   

## 2021-09-17 NOTE — Progress Notes (Signed)
The patient is alert and oriented and has been seen by his physician. The orders for discharge were written. IV has been removed. Went over discharge instructions with patient and family. He is being discharged to [facility] via [place] with all of his belongings.   ?

## 2021-09-18 ENCOUNTER — Other Ambulatory Visit: Payer: Self-pay | Admitting: Orthopedic Surgery

## 2021-09-18 DIAGNOSIS — R251 Tremor, unspecified: Secondary | ICD-10-CM

## 2021-09-18 DIAGNOSIS — W19XXXA Unspecified fall, initial encounter: Secondary | ICD-10-CM

## 2021-09-18 DIAGNOSIS — R531 Weakness: Secondary | ICD-10-CM

## 2021-09-19 ENCOUNTER — Telehealth: Payer: Self-pay | Admitting: *Deleted

## 2021-09-19 NOTE — Telephone Encounter (Signed)
Transition Care Management Unsuccessful Follow-up Telephone Call ? ?Date of discharge and from where:  09/17/2021 [facility] ? ?Attempts:  1st Attempt ? ?Reason for unsuccessful TCM follow-up call:  Unable to reach patient [facility] ? ?  ?

## 2021-09-20 ENCOUNTER — Other Ambulatory Visit: Payer: Self-pay | Admitting: Orthopedic Surgery

## 2021-09-20 DIAGNOSIS — G629 Polyneuropathy, unspecified: Secondary | ICD-10-CM

## 2021-09-22 ENCOUNTER — Encounter: Payer: Self-pay | Admitting: Orthopedic Surgery

## 2021-09-26 ENCOUNTER — Encounter: Payer: Self-pay | Admitting: Orthopedic Surgery

## 2021-09-30 DIAGNOSIS — M25552 Pain in left hip: Secondary | ICD-10-CM

## 2021-09-30 HISTORY — DX: Pain in left hip: M25.552

## 2021-10-13 ENCOUNTER — Encounter: Payer: Self-pay | Admitting: Neurology

## 2021-10-13 ENCOUNTER — Ambulatory Visit (INDEPENDENT_AMBULATORY_CARE_PROVIDER_SITE_OTHER): Payer: Medicare Other | Admitting: Neurology

## 2021-10-13 VITALS — BP 108/58 | HR 73 | Ht 66.0 in | Wt 145.5 lb

## 2021-10-13 DIAGNOSIS — G629 Polyneuropathy, unspecified: Secondary | ICD-10-CM | POA: Diagnosis not present

## 2021-10-13 DIAGNOSIS — G20 Parkinson's disease: Secondary | ICD-10-CM | POA: Diagnosis not present

## 2021-10-13 NOTE — Patient Instructions (Addendum)
Start exercising daily ?Trial of gabapentin nightly for neuropathy, can try 300 mg at night but if there is side effect of drowsiness, can decrease it to 100 mg at night ?Continue current medication ?Follow-up in 6 months or sooner if worse.  ? ? ?

## 2021-10-13 NOTE — Progress Notes (Addendum)
? ?[facility] ? ?PATIENT: Frederick Pham ?DOB: May 24, 1951 ? ?REQUESTING CLINICIAN: Aline August, MD ?HISTORY FROM: Patient and spouse  ?REASON FOR VISIT: Encephalopathy/Delirium  ? ? ?HISTORICAL ? ?CHIEF COMPLAINT:  ?Chief Complaint  ?Patient presents with  ? New Patient (Initial Visit)  ?  Rm 15. Accompanied by wife, [name]. ?NP/internal hospital referral for delirium d/c to [facility] SNF. ?Believes delirium was related to morphine. 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  ? ? ?HISTORY OF PRESENT ILLNESS:  ?This is a 71 year old gentleman with history on CLL, on observation, who was referred here for acute delirium after given morphine for pain.  Patient presented to the ED on April 3 after a fall and fracture of the left hip, in the ED he was noted to be in pain and given morphine soon after he was noted to be delirious. He never had morphine in the past and this is the first time, he had a reaction like this to medications.   He was admitted and subsequently had a total hip arthroplasty.  Since the surgery patient reported he has been doing well.  His main concern today is his left foot numbness and tingling.  Patient reports this has been going on before COVID, around 2019.  He does have occasional pain but most of his symptoms are numbness and tingling and mainly on the left foot.  ?He denies any recurrent fall, states that since the surgery he has noted a couple urinary incontinence but was told by his primary care doctor is related to the surgery, denies any dizziness, and wife denies any abnormal REM sleep behavior.  ? ? ? ?Hospital Summary ?71 year old male with history of CLL and recent right TKA for osteoarthritis presented with accidental fall while working with therapy.  He was found to have left hip fracture.  He underwent left total hip arthroplasty on 09/14/2021.  Subsequently, PT recommended SNF placement.  He will be discharged to SNF once bed is available.  Outpatient follow-up with  orthopedics. ?  ?Discharge Diagnoses:  ?Closed left hip fracture secondary to accidental mechanical fall ?-Orthopedics following: Status post left total hip arthroplasty on 09/14/2021 ?-Continue pain management.  Use Tylenol and tramadol if needed.  Fall precautions.  DVT prophylaxis as per orthopedics: Currently on Xarelto ?-PT recommends SNF placement.  He will be discharged to SNF once bed is available.  Outpatient follow-up with orthopedics. ?  ?Delirium ?Agitation ?-Possibly from narcotic/IV morphine use.  Avoid narcotics as much as possible. ?-Still slow to respond but mental status is much improved.   ?-Patient might benefit from outpatient neurology evaluation and follow-up to see if there are any other underlying neurological problems. ? ?OTHER MEDICAL CONDITIONS: CLL in remission,  left hip arthroplasty, right knee replacement ? ? ?REVIEW OF SYSTEMS: Full 14 system review of systems performed and negative with exception of: as noted in the HPI  ? ?ALLERGIES: ?Allergies  ?Allergen Reactions  ? Morphine Other (See Comments)  ?  " Caused extreme confusion and combativeness "   ? ? ?HOME MEDICATIONS: ?Outpatient Medications Prior to Visit  ?Medication Sig Dispense Refill  ? acetaminophen (TYLENOL) 325 MG tablet Take 1-2 tablets (325-650 mg total) by mouth every 6 (six) hours as needed for mild pain (pain score 1-3 or temp > 100.5). 40 tablet 0  ? methocarbamol (ROBAXIN) 500 MG tablet Take 1 tablet (500 mg total) by mouth every 6 (six) hours as needed for muscle spasms. 40 tablet 0  ? ondansetron (ZOFRAN) 4 MG tablet Take  1 tablet (4 mg total) by mouth every 6 (six) hours as needed for nausea. 20 tablet 0  ? ?No facility-administered medications prior to visit.  ? ? ?PAST MEDICAL HISTORY: ?Past Medical History:  ?Diagnosis Date  ? Arthritis   ? CLL (chronic lymphocytic leukemia) ([place])   ? ? ?PAST SURGICAL HISTORY: ?Past Surgical History:  ?Procedure Laterality Date  ? FRACTURE SURGERY    ? age 59  ? TONSILLECTOMY     ? age 15  ? TOTAL HIP ARTHROPLASTY Left 09/14/2021  ? Procedure: TOTAL HIP ARTHROPLASTY ANTERIOR APPROACH;  Surgeon: Gaynelle Arabian, MD;  Location: [facility];  Service: Orthopedics;  Laterality: Left;  ? TOTAL KNEE ARTHROPLASTY Right 09/05/2021  ? Procedure: TOTAL KNEE ARTHROPLASTY;  Surgeon: Gaynelle Arabian, MD;  Location: [facility];  Service: Orthopedics;  Laterality: Right;  ? WISDOM TOOTH EXTRACTION    ? ? ?FAMILY HISTORY: ?Family History  ?Adopted: Yes  ? ? ?SOCIAL HISTORY: ?Social History  ? ?Socioeconomic History  ? Marital status: Married  ?  Spouse name: Not on file  ? Number of children: Not on file  ? Years of education: Not on file  ? Highest education level: Not on file  ?Occupational History  ? Not on file  ?Tobacco Use  ? Smoking status: Former  ?  Packs/day: 1.00  ?  Years: 10.00  ?  Pack years: 10.00  ?  Types: Cigarettes  ?  Quit date: 10/08/1990  ?  Years since quitting: 31.0  ? Smokeless tobacco: Never  ?Vaping Use  ? Vaping Use: Never used  ?Substance and Sexual Activity  ? Alcohol use: Yes  ?  Alcohol/week: 1.0 - 2.0 standard drink  ?  Types: 1 - 2 Standard drinks or equivalent per week  ?  Comment: occas.  ? Drug use: Never  ? Sexual activity: Not on file  ?Other Topics Concern  ? Not on file  ?Social History Narrative  ? Diet:  ?   ? Caffeine: Yes  ?   ? Married, if yes what year: Yes, 1977  ?   ? Do you live in a house, apartment, assisted living, condo, trailer, ect: House  ?   ? Is it one or more stories: 3  ?   ? How many persons live in your home? 2  ?   ? Pets: No  ?   ? Highest level or education completed: 15  ?   ? Current/Past profession: Chief Financial Officer, CEO  ?   ? Exercise: Yes                 Type and how often: weights- normally 5 days/week  ?   ?   ? Living Will: No  ? DNR: No  ? POA/HPOA: No  ?   ? Functional Status:  ? Do you have difficulty bathing or dressing yourself? No  ? Do you have difficulty preparing food or eating? No  ? Do you have difficulty managing your medications? No  ? Do you  have difficulty managing your finances? No  ? Do you have difficulty affording your medications? No  ? ?Social Determinants of Health  ? ?Financial Resource Strain: Not on file  ?Food Insecurity: Not on file  ?Transportation Needs: Not on file  ?Physical Activity: Not on file  ?Stress: Not on file  ?Social Connections: Not on file  ?Intimate Partner Violence: Not on file  ? ? ?PHYSICAL EXAM ? ?GENERAL EXAM/CONSTITUTIONAL: ?Vitals:  ?Vitals:  ? 10/13/21 1033  ?  BP: (!) 108/58  ?Pulse: 73  ?Weight: 145 lb 8 oz (66 kg)  ?Height: '5\' 6"'$  (1.676 m)  ? ?Body mass index is 23.48 kg/m?. ?Wt Readings from Last 3 Encounters:  ?10/13/21 145 lb 8 oz (66 kg)  ?09/13/21 150 lb 5.7 oz (68.2 kg)  ?09/05/21 139 lb (63 kg)  ? ?Patient is in no distress; well developed, nourished and groomed; neck is supple. He has masked facies and decrease blink rate  ? ?CARDIOVASCULAR: ?Examination of carotid arteries is normal; no carotid bruits ?Regular rate and rhythm, no murmurs ?Examination of peripheral vascular system by observation and palpation reveals mild edema ? ?EYES: ?Pupils round and reactive to light, Visual fields full to confrontation, Extraocular movements intacts,  ? ?MUSCULOSKELETAL: ?Gait, strength, tone, movements noted in Neurologic exam below ? ?NEUROLOGIC: ?MENTAL STATUS:  ? ?  03/17/2021  ? 10:15 AM  ?MMSE - Mini Mental State Exam  ?Orientation to time 5  ?Orientation to Place 5  ?Registration 3  ?Attention/ Calculation 5  ?Recall 3  ?Language- name 2 objects 2  ?Language- repeat 1  ?Language- follow 3 step command 3  ?Language- read & follow direction 1  ?Write a sentence 1  ?Copy design 1  ?Total score 30  ? ?awake, alert, oriented to person, place and time ?recent and remote memory intact ?normal attention and concentration ?language fluent, comprehension intact, naming intact ?fund of knowledge appropriate ? ?CRANIAL NERVE:  ?2nd, 3rd, 4th, 6th - pupils equal and reactive to light, visual fields full to confrontation,  extraocular muscles intact, no nystagmus ?5th - facial sensation symmetric ?7th - facial strength symmetric ?8th - hearing intact ?9th - palate elevates symmetrically, uvula midline ?11th - shoulder shrug sym

## 2021-10-20 ENCOUNTER — Encounter: Payer: Self-pay | Admitting: Neurology

## 2021-10-20 ENCOUNTER — Encounter (INDEPENDENT_AMBULATORY_CARE_PROVIDER_SITE_OTHER): Payer: Medicare Other | Admitting: Neurology

## 2021-10-20 DIAGNOSIS — G20 Parkinson's disease: Secondary | ICD-10-CM | POA: Diagnosis not present

## 2021-10-20 MED ORDER — CARBIDOPA-LEVODOPA 25-100 MG PO TABS: ORAL_TABLET | ORAL | 0 refills | Status: DC

## 2021-10-20 NOTE — Telephone Encounter (Signed)
Please see the MyChart message reply(ies) for my assessment and plan.  ?  ?This patient gave consent for this Medical Advice Message and is aware that it may result in a bill to [employer], as well as the possibility of receiving a bill for a co-payment or deductible. They are an established patient, but are not seeking medical advice exclusively about a problem treated during an in person or video visit in the last seven days. I did not recommend an in person or video visit within seven days of my reply.  ?  ?I spent a total of 6 minutes cumulative time within 7 days through [employer]. ? ?Alric Ran, MD   ?

## 2021-11-14 ENCOUNTER — Other Ambulatory Visit: Payer: Self-pay | Admitting: Neurology

## 2021-11-15 ENCOUNTER — Encounter: Payer: Self-pay | Admitting: Neurology

## 2021-11-16 MED ORDER — CARBIDOPA-LEVODOPA 25-100 MG PO TABS: 1.0000 | ORAL_TABLET | Freq: Three times a day (TID) | ORAL | 3 refills | Status: DC

## 2021-11-22 ENCOUNTER — Ambulatory Visit (INDEPENDENT_AMBULATORY_CARE_PROVIDER_SITE_OTHER): Payer: Medicare Other | Admitting: Podiatry

## 2021-11-22 DIAGNOSIS — B351 Tinea unguium: Secondary | ICD-10-CM | POA: Diagnosis not present

## 2021-11-22 DIAGNOSIS — M79674 Pain in right toe(s): Secondary | ICD-10-CM

## 2021-11-22 DIAGNOSIS — M79675 Pain in left toe(s): Secondary | ICD-10-CM | POA: Diagnosis not present

## 2021-11-24 ENCOUNTER — Other Ambulatory Visit: Payer: Self-pay

## 2021-11-24 ENCOUNTER — Inpatient Hospital Stay (HOSPITAL_BASED_OUTPATIENT_CLINIC_OR_DEPARTMENT_OTHER): Payer: Medicare Other | Admitting: Hematology and Oncology

## 2021-11-24 ENCOUNTER — Inpatient Hospital Stay: Payer: Medicare Other | Attending: Hematology and Oncology

## 2021-11-24 VITALS — BP 134/68 | HR 69 | Temp 97.7°F | Resp 16 | Ht 66.0 in | Wt 145.1 lb

## 2021-11-24 DIAGNOSIS — Z87891 Personal history of nicotine dependence: Secondary | ICD-10-CM | POA: Diagnosis not present

## 2021-11-24 DIAGNOSIS — C9110 Chronic lymphocytic leukemia of B-cell type not having achieved remission: Secondary | ICD-10-CM | POA: Insufficient documentation

## 2021-11-24 DIAGNOSIS — D696 Thrombocytopenia, unspecified: Secondary | ICD-10-CM

## 2021-11-24 DIAGNOSIS — D72820 Lymphocytosis (symptomatic): Secondary | ICD-10-CM

## 2021-11-24 LAB — CMP (CANCER CENTER ONLY)
ALT: <5 U/L (ref 0–44)
AST: 13 U/L — ABNORMAL LOW (ref 15–41)
Albumin: 4.2 g/dL (ref 3.5–5.0)
Alkaline Phosphatase: 86 U/L (ref 38–126)
Anion gap: 4 — ABNORMAL LOW (ref 5–15)
BUN: 31 mg/dL — ABNORMAL HIGH (ref 8–23)
CO2: 31 mmol/L (ref 22–32)
Calcium: 9.7 mg/dL (ref 8.9–10.3)
Chloride: 105 mmol/L (ref 98–111)
Creatinine: 1.34 mg/dL — ABNORMAL HIGH (ref 0.61–1.24)
GFR, Estimated: 57 mL/min — ABNORMAL LOW (ref >60)
Glucose, Bld: 107 mg/dL — ABNORMAL HIGH (ref 70–99)
Potassium: 4.6 mmol/L (ref 3.5–5.1)
Sodium: 140 mmol/L (ref 135–145)
Total Bilirubin: 0.8 mg/dL (ref 0.3–1.2)
Total Protein: 6.4 g/dL — ABNORMAL LOW (ref 6.5–8.1)

## 2021-11-24 LAB — CBC WITH DIFFERENTIAL (CANCER CENTER ONLY)
Abs Immature Granulocytes: 0.04 10*3/uL (ref 0.00–0.07)
Basophils Absolute: 0.1 10*3/uL (ref 0.0–0.1)
Basophils Relative: 0 %
Eosinophils Absolute: 0 10*3/uL (ref 0.0–0.5)
Eosinophils Relative: 0 %
HCT: 39 % (ref 39.0–52.0)
Hemoglobin: 12.9 g/dL — ABNORMAL LOW (ref 13.0–17.0)
Immature Granulocytes: 0 %
Lymphocytes Relative: 94 %
Lymphs Abs: 39.2 10*3/uL — ABNORMAL HIGH (ref 0.7–4.0)
MCH: 30.4 pg (ref 26.0–34.0)
MCHC: 33.1 g/dL (ref 30.0–36.0)
MCV: 91.8 fL (ref 80.0–100.0)
Monocytes Absolute: 0.2 10*3/uL (ref 0.1–1.0)
Monocytes Relative: 1 %
Neutro Abs: 2.2 10*3/uL (ref 1.7–7.7)
Neutrophils Relative %: 5 %
Platelet Count: 122 10*3/uL — ABNORMAL LOW (ref 150–400)
RBC: 4.25 MIL/uL (ref 4.22–5.81)
RDW: 14.6 % (ref 11.5–15.5)
Smear Review: NORMAL
WBC Count: 41.8 10*3/uL — ABNORMAL HIGH (ref 4.0–10.5)
nRBC: 0 % (ref 0.0–0.2)

## 2021-11-24 LAB — LACTATE DEHYDROGENASE: LDH: 95 U/L — ABNORMAL LOW (ref 98–192)

## 2021-11-24 NOTE — Progress Notes (Signed)
[facility] [phone]   [phone]  PROGRESS NOTE  Patient Care Team: Yvonna Alanis, NP as PCP - General (Adult Health Nurse Practitioner) [name], DPM as Consulting Physician (Podiatry) Gaynelle Arabian, MD as Consulting Physician (Orthopedic Surgery)  Hematological/Oncological History # Chronic Lymphocytic Leukemia, Rai Stage I 1) 01/21/2021: WBC 39.8 (H), Hgb 14.2, Plt 131 (L)  2) 02/08/2021: Establish care with Dede Query PA-C. WBC: 46.7, Hgb 14.1, Plt 113K. ALC 45,900. Flow cytometry confirms a monoclonal B cell population consistent with CLL.  3) 02/18/2021: CLL prognostic panel results show normal cytogenetics, intermediate risk. IgVH positive. CT C/A/P showed enlarged retroperitoneal, portacaval, bilateral iliac, and pelvic sidewall lymph nodes, no evidence of splenomegaly.  05/26/2021: WBC 48.8, Hgb 13.3, MCV 95, Plt 90 08/24/2021: WBC 60.8, Hgb 13.6, MCV 94.5, Plt 108 11/24/2021: WBC 41.8, Hgb 12.9, MCV 91.8, Plt 122  Interval History:  Frederick Pham 71 y.o. male with medical history significant for newly diagnosed CLL who presents for a follow up visit. The patient's last visit was on 08/24/2020. In the interim since the last visit he has had a knee surgery followed promptly by a hip surgery on 09/14/2021.  On exam today Frederick Pham is accompanied by his wife.  He reports he is recovering well from his multiple surgeries.  He notes that he was recovering from his knee surgery when he fell and unfortunately broke his left hip requiring the surgery on 09/14/2021.  He reports that he is healing well from this and is impressed with the rate at which she is recovering.  His energy is good today and reports it is 7 or 8 out of 10.  He notes that he has been doing his best to eat well and exercise.  He is trying to eat more red meat.  He also notes he is not having any issues with bleeding, bruising, or dark stools.  He denies any pronounced  lymphadenopathy.  He denies fevers, chills, night sweats, shortness of breath, chest pain, palpable lymph nodes or abdominal fullness.  He has no other complaints.  The 10 point ROS is below.  MEDICAL HISTORY:  Past Medical History:  Diagnosis Date   Arthritis    CLL (chronic lymphocytic leukemia) ([place])     SURGICAL HISTORY: Past Surgical History:  Procedure Laterality Date   FRACTURE SURGERY     age 66   TONSILLECTOMY     age 81   TOTAL HIP ARTHROPLASTY Left 09/14/2021   Procedure: TOTAL HIP ARTHROPLASTY ANTERIOR APPROACH;  Surgeon: Gaynelle Arabian, MD;  Location: [facility];  Service: Orthopedics;  Laterality: Left;   TOTAL KNEE ARTHROPLASTY Right 09/05/2021   Procedure: TOTAL KNEE ARTHROPLASTY;  Surgeon: Gaynelle Arabian, MD;  Location: [facility];  Service: Orthopedics;  Laterality: Right;   WISDOM TOOTH EXTRACTION      SOCIAL HISTORY: Social History   Socioeconomic History   Marital status: Married    Spouse name: Not on file   Number of children: Not on file   Years of education: Not on file   Highest education level: Not on file  Occupational History   Not on file  Tobacco Use   Smoking status: Former    Packs/day: 1.00    Years: 10.00    Total pack years: 10.00    Types: Cigarettes    Quit date: 10/08/1990    Years since quitting: 31.1   Smokeless tobacco: Never  Vaping Use   Vaping Use: Never used  Substance  and Sexual Activity   Alcohol use: Yes    Alcohol/week: 1.0 - 2.0 standard drink of alcohol    Types: 1 - 2 Standard drinks or equivalent per week    Comment: occas.   Drug use: Never   Sexual activity: Not on file  Other Topics Concern   Not on file  Social History Narrative   Diet:      Caffeine: Yes      Married, if yes what year: Yes, 1977      Do you live in a house, apartment, assisted living, condo, trailer, ect: House      Is it one or more stories: 3      How many persons live in your home? 2      Pets: No      Highest level or education  completed: 15      Current/Past profession: Chief Financial Officer, Teacher, English as a foreign language      Exercise: Yes                 Type and how often: weights- normally 5 days/week         Living Will: No   DNR: No   POA/HPOA: No      Functional Status:   Do you have difficulty bathing or dressing yourself? No   Do you have difficulty preparing food or eating? No   Do you have difficulty managing your medications? No   Do you have difficulty managing your finances? No   Do you have difficulty affording your medications? No   Social Determinants of Radio broadcast assistant Strain: Not on file  Food Insecurity: Not on file  Transportation Needs: Not on file  Physical Activity: Not on file  Stress: Not on file  Social Connections: Not on file  Intimate Partner Violence: Not on file    FAMILY HISTORY: Family History  Adopted: Yes    ALLERGIES:  is allergic to morphine.  MEDICATIONS:  Current Outpatient Medications  Medication Sig Dispense Refill   acetaminophen (TYLENOL) 325 MG tablet Take 1-2 tablets (325-650 mg total) by mouth every 6 (six) hours as needed for mild pain (pain score 1-3 or temp > 100.5). 40 tablet 0   carbidopa-levodopa (SINEMET IR) 25-100 MG tablet Take 1 tablet by mouth 3 (three) times daily. 270 tablet 3   methocarbamol (ROBAXIN) 500 MG tablet Take 1 tablet (500 mg total) by mouth every 6 (six) hours as needed for muscle spasms. 40 tablet 0   No current facility-administered medications for this visit.    REVIEW OF SYSTEMS:   Constitutional: ( - ) fevers, ( - )  chills , ( - ) night sweats Eyes: ( - ) blurriness of vision, ( - ) double vision, ( - ) watery eyes Ears, nose, mouth, throat, and face: ( - ) mucositis, ( - ) sore throat Respiratory: ( - ) cough, ( - ) dyspnea, ( - ) wheezes Cardiovascular: ( - ) palpitation, ( - ) chest discomfort, ( - ) lower extremity swelling Gastrointestinal:  ( - ) nausea, ( - ) heartburn, ( - ) change in bowel habits Skin: ( - ) abnormal skin  rashes Lymphatics: ( - ) new lymphadenopathy, ( - ) easy bruising Neurological: ( - ) numbness, ( - ) tingling, ( - ) new weaknesses Behavioral/Psych: ( - ) mood change, ( - ) new changes  All other systems were reviewed with the patient and are negative.  PHYSICAL EXAMINATION: ECOG PERFORMANCE STATUS: 0 -  Asymptomatic  Vitals:   11/24/21 1045  BP: 134/68  Pulse: 69  Resp: 16  Temp: 97.7 F (36.5 C)  SpO2: 98%    Filed Weights   11/24/21 1045  Weight: 145 lb 1.6 oz (65.8 kg)     GENERAL: Well-appearing elderly Caucasian male, alert, no distress and comfortable SKIN: skin color, texture, turgor are normal, no rashes or significant lesions EYES: conjunctiva are pink and non-injected, sclera clear NECK: supple, non-tender LYMPH:  no palpable lymphadenopathy in the cervical, axillary or inguinal LUNGS: clear to auscultation and percussion with normal breathing effort HEART: regular rate & rhythm and no murmurs and no lower extremity edema Musculoskeletal: no cyanosis of digits and no clubbing  PSYCH: alert & oriented x 3, fluent speech NEURO: no focal motor/sensory deficits  LABORATORY DATA:  I have reviewed the data as listed    Latest Ref Rng & Units 11/24/2021   10:22 AM 09/17/2021    3:11 AM 09/16/2021    3:17 AM  CBC  WBC 4.0 - 10.5 K/uL 41.8  50.3  59.4   Hemoglobin 13.0 - 17.0 g/dL 12.9  8.0  8.2   Hematocrit 39.0 - 52.0 % 39.0  25.5  26.1   Platelets 150 - 400 K/uL 122  175  173        Latest Ref Rng & Units 11/24/2021   10:22 AM 09/16/2021    3:17 AM 09/15/2021    2:47 AM  CMP  Glucose 70 - 99 mg/dL 107  136  158   BUN 8 - 23 mg/dL '31  29  26   '$ Creatinine 0.61 - 1.24 mg/dL 1.34  0.89  0.93   Sodium 135 - 145 mmol/L 140  143  142   Potassium 3.5 - 5.1 mmol/L 4.6  3.5  4.3   Chloride 98 - 111 mmol/L 105  108  108   CO2 22 - 32 mmol/L '31  28  28   '$ Calcium 8.9 - 10.3 mg/dL 9.7  8.3  8.4   Total Protein 6.5 - 8.1 g/dL 6.4     Total Bilirubin 0.3 - 1.2 mg/dL 0.8      Alkaline Phos 38 - 126 U/L 86     AST 15 - 41 U/L 13     ALT 0 - 44 U/L <5       RADIOGRAPHIC STUDIES: No results found.  ASSESSMENT & PLAN Frederick Pham 71 y.o. male with medical history significant for newly diagnosed CLL who presents for a follow up visit.   At this time the patient's findings are most consistent with a Rai stage I CLL.  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    # Chronic Lymphocytic Leukemia, Rai Stage I --patient is Stage I based on lymphocytosis and lymphadenopathy --prognostic panel shows normal cytogenetics, intermediate risk. 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  -- No indication to treat based on today's labs.  Leukocytosis is stable and patient has only mild cytopenias..  -- Labs today show white blood cell count 41.8, hemoglobin 12.9, MCV 91.8, and platelets of 122. --recommend continued observation with a return clinic visit in 6 months time.   No orders of the defined types were placed in this encounter.   All questions were answered. The patient knows to call the clinic with any problems, questions or concerns.  I have spent a total of  30 minutes minutes of face-to-face and non-face-to-face time, preparing to see the patient, performing a medically appropriate examination, counseling and educating the patient, ordering tests, documenting clinical information in the electronic health record, and care coordination.   Ledell Peoples, MD Department of Hematology/Oncology [place] [facility] Phone: [phone] Pager: [phone] Email: [email or web address]  11/24/2021 11:27 AM

## 2021-11-25 ENCOUNTER — Encounter: Payer: Self-pay | Admitting: Podiatry

## 2021-11-25 NOTE — Progress Notes (Signed)
  Subjective:  Patient ID: Frederick Pham, male    DOB: [birth year],  MRN: [id number]  Chief Complaint  Patient presents with   Nail Problem    follow up after nail fungus laser treatment    71 y.o. male presents with the above complaint. History confirmed with patient.  He returns today for follow-up has not been able to proceed with any laser treatment yet due to complications after a fall after knee surgery having to have hip replacement, he is doing much better now with his rehab  Objective:  Physical Exam: warm, good capillary refill, no trophic changes or ulcerative lesions, normal DP and PT pulses, and normal sensory exam. Left Foot: dystrophic yellowed discolored nail plates with subungual debris Right Foot: dystrophic yellowed discolored nail plates with subungual debris  LFTs normal      Assessment:   1. Pain due to onychomycosis of toenails of both feet      Plan:  Patient was evaluated and treated and all questions answered.    Discussed the etiology and treatment options for the condition in detail with the patient. Educated patient on the topical and oral treatment options for mycotic nails. Recommended debridement of the nails today. Sharp and mechanical debridement performed of all painful and mycotic nails today. Nails debrided in length and thickness using a nail nipper to level of comfort. Discussed treatment options including appropriate shoe gear. Follow up as needed for painful nails.  We will proceed with laser treatment for the onychomycosis, he will be scheduled for this between now and the next visit I will reevaluate him then in 6 months.  Photographs taken today.  No follow-ups on file.

## 2021-12-23 ENCOUNTER — Ambulatory Visit (INDEPENDENT_AMBULATORY_CARE_PROVIDER_SITE_OTHER): Payer: Self-pay | Admitting: Podiatry

## 2021-12-23 DIAGNOSIS — B351 Tinea unguium: Secondary | ICD-10-CM

## 2021-12-23 NOTE — Progress Notes (Signed)
Patient presents today for the 1st laser treatment. Diagnosed with onychomycosis by Dr. Sherryle Lis.   Toenail most affected bilateral hallux.   All other systems are negative.   Nails were filed thin. Laser therapy was administered to 1-5 toenails bilateral and patient tolerated the treatment well. All safety precautions were in place.      Follow up in 4 weeks for laser # 2.

## 2022-02-10 ENCOUNTER — Ambulatory Visit (INDEPENDENT_AMBULATORY_CARE_PROVIDER_SITE_OTHER): Payer: Medicare Other

## 2022-02-10 DIAGNOSIS — B351 Tinea unguium: Secondary | ICD-10-CM

## 2022-02-10 NOTE — Progress Notes (Signed)
Patient presents today for the 2nd laser treatment. Diagnosed with onychomycosis by Dr. Sherryle Lis.   Toenail most affected bilateral hallux.   All other systems are negative.   Nails were filed thin. Laser therapy was administered to 1-5 toenails bilateral and patient tolerated the treatment well. All safety precautions were in place.      Follow up in 4 weeks for laser # 3.

## 2022-03-24 ENCOUNTER — Ambulatory Visit (INDEPENDENT_AMBULATORY_CARE_PROVIDER_SITE_OTHER): Payer: Medicare Other

## 2022-03-24 DIAGNOSIS — B351 Tinea unguium: Secondary | ICD-10-CM

## 2022-03-24 NOTE — Progress Notes (Signed)
Patient presents today for the 3rd laser treatment. Diagnosed with onychomycosis by Dr. Sherryle Lis.   Toenail most affected bilateral hallux.   All other systems are negative.   Nails were filed thin. Laser therapy was administered to 1-5 toenails bilateral and patient tolerated the treatment well. All safety precautions were in place.      Follow up in 6 weeks for laser # 4.

## 2022-04-20 ENCOUNTER — Ambulatory Visit: Payer: Medicare Other | Admitting: Neurology

## 2022-05-08 ENCOUNTER — Ambulatory Visit (INDEPENDENT_AMBULATORY_CARE_PROVIDER_SITE_OTHER): Payer: Medicare Other

## 2022-05-08 DIAGNOSIS — B351 Tinea unguium: Secondary | ICD-10-CM

## 2022-05-08 NOTE — Progress Notes (Signed)
Patient presents today for the 4th laser treatment. Diagnosed with onychomycosis by Dr. Sherryle Lis.   Toenail most affected bilateral hallux.   All other systems are negative.   Nails were filed thin. Laser therapy was administered to 1-5 toenails bilateral and patient tolerated the treatment well. All safety precautions were in place.      Follow up in 6 weeks for laser # 5.

## 2022-05-22 ENCOUNTER — Other Ambulatory Visit: Payer: Self-pay | Admitting: Hematology and Oncology

## 2022-05-22 DIAGNOSIS — C9110 Chronic lymphocytic leukemia of B-cell type not having achieved remission: Secondary | ICD-10-CM

## 2022-05-22 NOTE — Progress Notes (Signed)
[place] Telephone:([phone]) [phone]   [phone]  PROGRESS NOTE  Patient Care Team: Yvonna Alanis, NP as PCP - General (Adult Health Nurse Practitioner) [name], DPM as Consulting Physician (Podiatry) Gaynelle Arabian, MD as Consulting Physician (Orthopedic Surgery)  Hematological/Oncological History # Chronic Lymphocytic Leukemia, Rai Stage I 1) 01/21/2021: WBC 39.8 (H), Hgb 14.2, Plt 131 (L)  2) 02/08/2021: Establish care with Dede Query PA-C. WBC: 46.7, Hgb 14.1, Plt 113K. ALC 45,900. Flow cytometry confirms a monoclonal B cell population consistent with CLL.  3) 02/18/2021: CLL prognostic panel results show normal cytogenetics, intermediate risk. IgVH positive. CT C/A/P showed enlarged retroperitoneal, portacaval, bilateral iliac, and pelvic sidewall lymph nodes, no evidence of splenomegaly.  05/26/2021: WBC 48.8, Hgb 13.3, MCV 95, Plt 90 08/24/2021: WBC 60.8, Hgb 13.6, MCV 94.5, Plt 108 11/24/2021: WBC 41.8, Hgb 12.9, MCV 91.8, Plt 122 05/24/2022: WBC 59.0, Hgb 13.7, MCV 95.2, Plt 101  Interval History:  Frederick Pham 71 y.o. male with medical history significant for newly diagnosed CLL who presents for a follow up visit. The patient's last visit was on 11/24/2021. In the interim since the last visit he has had no major changes in his health.  On exam today Frederick Pham is accompanied by his wife.  He reports he has been well overall in the interim since her last visit.  He notes that he has not had any trouble with bleeding or bruising.  He notes his appetite has been strong and his weight has been steady.  He denies any lymphadenopathy in his neck, underarms, or groin.  He denies any abdominal swelling or early satiety.  He also is not having any B symptoms such as fevers, chills, sweats, nausea, vomiting or diarrhea.  Overall he is stable at his baseline level of health.  The 10 point ROS is below.  MEDICAL HISTORY:  Past Medical History:  Diagnosis Date    Arthritis    CLL (chronic lymphocytic leukemia) ([place])     SURGICAL HISTORY: Past Surgical History:  Procedure Laterality Date   FRACTURE SURGERY     age 22   TONSILLECTOMY     age 55   TOTAL HIP ARTHROPLASTY Left 09/14/2021   Procedure: TOTAL HIP ARTHROPLASTY ANTERIOR APPROACH;  Surgeon: Gaynelle Arabian, MD;  Location: [facility];  Service: Orthopedics;  Laterality: Left;   TOTAL KNEE ARTHROPLASTY Right 09/05/2021   Procedure: TOTAL KNEE ARTHROPLASTY;  Surgeon: Gaynelle Arabian, MD;  Location: [facility];  Service: Orthopedics;  Laterality: Right;   WISDOM TOOTH EXTRACTION      SOCIAL HISTORY: Social History   Socioeconomic History   Marital status: Married    Spouse name: Not on file   Number of children: Not on file   Years of education: Not on file   Highest education level: Not on file  Occupational History   Not on file  Tobacco Use   Smoking status: Former    Packs/day: 1.00    Years: 10.00    Total pack years: 10.00    Types: Cigarettes    Quit date: 10/08/1990    Years since quitting: 31.6   Smokeless tobacco: Never  Vaping Use   Vaping Use: Never used  Substance and Sexual Activity   Alcohol use: Yes    Alcohol/week: 1.0 - 2.0 standard drink of alcohol    Types: 1 - 2 Standard drinks or equivalent per week    Comment: occas.   Drug use: Never   Sexual activity: Not  on file  Other Topics Concern   Not on file  Social History Narrative   Diet:      Caffeine: Yes      Married, if yes what year: Yes, 1977      Do you live in a house, apartment, assisted living, condo, trailer, ect: House      Is it one or more stories: 3      How many persons live in your home? 2      Pets: No      Highest level or education completed: 15      Current/Past profession: Chief Financial Officer, Teacher, English as a foreign language      Exercise: Yes                 Type and how often: weights- normally 5 days/week         Living Will: No   DNR: No   POA/HPOA: No      Functional Status:   Do you have difficulty  bathing or dressing yourself? No   Do you have difficulty preparing food or eating? No   Do you have difficulty managing your medications? No   Do you have difficulty managing your finances? No   Do you have difficulty affording your medications? No   Social Determinants of Radio broadcast assistant Strain: Not on file  Food Insecurity: Not on file  Transportation Needs: Not on file  Physical Activity: Not on file  Stress: Not on file  Social Connections: Not on file  Intimate Partner Violence: Not on file    FAMILY HISTORY: Family History  Adopted: Yes    ALLERGIES:  is allergic to morphine.  MEDICATIONS:  Current Outpatient Medications  Medication Sig Dispense Refill   acetaminophen (TYLENOL) 325 MG tablet Take 1-2 tablets (325-650 mg total) by mouth every 6 (six) hours as needed for mild pain (pain score 1-3 or temp > 100.5). 40 tablet 0   carbidopa-levodopa (SINEMET IR) 25-100 MG tablet Take 1 tablet by mouth 3 (three) times daily. 270 tablet 3   No current facility-administered medications for this visit.    REVIEW OF SYSTEMS:   Constitutional: ( - ) fevers, ( - )  chills , ( - ) night sweats Eyes: ( - ) blurriness of vision, ( - ) double vision, ( - ) watery eyes Ears, nose, mouth, throat, and face: ( - ) mucositis, ( - ) sore throat Respiratory: ( - ) cough, ( - ) dyspnea, ( - ) wheezes Cardiovascular: ( - ) palpitation, ( - ) chest discomfort, ( - ) lower extremity swelling Gastrointestinal:  ( - ) nausea, ( - ) heartburn, ( - ) change in bowel habits Skin: ( - ) abnormal skin rashes Lymphatics: ( - ) new lymphadenopathy, ( - ) easy bruising Neurological: ( - ) numbness, ( - ) tingling, ( - ) new weaknesses Behavioral/Psych: ( - ) mood change, ( - ) new changes  All other systems were reviewed with the patient and are negative.  PHYSICAL EXAMINATION: ECOG PERFORMANCE STATUS: 0 - Asymptomatic  Vitals:   05/24/22 1127  BP: (!) 141/82  Pulse: 70  Resp: 15   Temp: 97.6 F (36.4 C)  SpO2: 100%     Filed Weights   05/24/22 1127  Weight: 151 lb 8 oz (68.7 kg)      GENERAL: Well-appearing elderly Caucasian male, alert, no distress and comfortable SKIN: skin color, texture, turgor are normal, no rashes or significant lesions EYES: conjunctiva are  pink and non-injected, sclera clear NECK: supple, non-tender LYMPH:  no palpable lymphadenopathy in the cervical, axillary or inguinal LUNGS: clear to auscultation and percussion with normal breathing effort HEART: regular rate & rhythm and no murmurs and no lower extremity edema Musculoskeletal: no cyanosis of digits and no clubbing  PSYCH: alert & oriented x 3, fluent speech NEURO: no focal motor/sensory deficits  LABORATORY DATA:  I have reviewed the data as listed    Latest Ref Rng & Units 05/24/2022   10:54 AM 11/24/2021   10:22 AM 09/17/2021    3:11 AM  CBC  WBC 4.0 - 10.5 K/uL 59.0  41.8  50.3   Hemoglobin 13.0 - 17.0 g/dL 13.7  12.9  8.0   Hematocrit 39.0 - 52.0 % 41.3  39.0  25.5   Platelets 150 - 400 K/uL 101  122  175        Latest Ref Rng & Units 05/24/2022   10:54 AM 11/24/2021   10:22 AM 09/16/2021    3:17 AM  CMP  Glucose 70 - 99 mg/dL 112  107  136   BUN 8 - 23 mg/dL '25  31  29   '$ Creatinine 0.61 - 1.24 mg/dL 1.23  1.34  0.89   Sodium 135 - 145 mmol/L 141  140  143   Potassium 3.5 - 5.1 mmol/L 4.6  4.6  3.5   Chloride 98 - 111 mmol/L 107  105  108   CO2 22 - 32 mmol/L '31  31  28   '$ Calcium 8.9 - 10.3 mg/dL 9.7  9.7  8.3   Total Protein 6.5 - 8.1 g/dL 6.0  6.4    Total Bilirubin 0.3 - 1.2 mg/dL 1.3  0.8    Alkaline Phos 38 - 126 U/L 59  86    AST 15 - 41 U/L 14  13    ALT 0 - 44 U/L <5  <5      RADIOGRAPHIC STUDIES: No results found.  ASSESSMENT & PLAN Frederick Pham 71 y.o. male with medical history significant for newly diagnosed CLL who presents for a follow up visit.   At this time the patient's findings are most consistent with a Rai stage I CLL.  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    # Chronic Lymphocytic Leukemia, Rai Stage I --patient is Stage I based on lymphocytosis and lymphadenopathy --prognostic panel shows normal cytogenetics, intermediate risk. 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  -- No indication to treat based on today's labs.  Leukocytosis is stable and patient has only mild cytopenias..  -- Labs today show white blood cell count 59.0, Hgb 13.7, MCV 95.2, Plt 101 --recommend continued observation with a return clinic visit in 6 months time.   No orders of the defined types were placed in this encounter.   All questions were answered. The patient knows to call the clinic with any problems, questions or concerns.  I have spent a total of 30 minutes minutes of face-to-face and non-face-to-face time, preparing to see the patient, performing a medically appropriate examination, counseling and educating the patient, ordering tests, documenting clinical information in the electronic health record, and care coordination.   Ledell Peoples, MD Department of Hematology/Oncology [place] at [facility] Phone: [phone] Pager: [phone] Email: [email or web address]  05/28/2022 10:21 PM

## 2022-05-24 ENCOUNTER — Other Ambulatory Visit: Payer: Self-pay

## 2022-05-24 ENCOUNTER — Inpatient Hospital Stay (HOSPITAL_BASED_OUTPATIENT_CLINIC_OR_DEPARTMENT_OTHER): Payer: Medicare Other | Admitting: Hematology and Oncology

## 2022-05-24 ENCOUNTER — Inpatient Hospital Stay: Payer: Medicare Other | Attending: Hematology and Oncology

## 2022-05-24 VITALS — BP 141/82 | HR 70 | Temp 97.6°F | Resp 15 | Wt 151.5 lb

## 2022-05-24 DIAGNOSIS — D72829 Elevated white blood cell count, unspecified: Secondary | ICD-10-CM

## 2022-05-24 DIAGNOSIS — Z87891 Personal history of nicotine dependence: Secondary | ICD-10-CM | POA: Insufficient documentation

## 2022-05-24 DIAGNOSIS — D72820 Lymphocytosis (symptomatic): Secondary | ICD-10-CM | POA: Diagnosis not present

## 2022-05-24 DIAGNOSIS — D696 Thrombocytopenia, unspecified: Secondary | ICD-10-CM

## 2022-05-24 DIAGNOSIS — C9110 Chronic lymphocytic leukemia of B-cell type not having achieved remission: Secondary | ICD-10-CM | POA: Diagnosis not present

## 2022-05-24 LAB — CBC WITH DIFFERENTIAL (CANCER CENTER ONLY)
Abs Immature Granulocytes: 0.03 10*3/uL (ref 0.00–0.07)
Basophils Absolute: 0 10*3/uL (ref 0.0–0.1)
Basophils Relative: 0 %
Eosinophils Absolute: 0 10*3/uL (ref 0.0–0.5)
Eosinophils Relative: 0 %
HCT: 41.3 % (ref 39.0–52.0)
Hemoglobin: 13.7 g/dL (ref 13.0–17.0)
Immature Granulocytes: 0 %
Lymphocytes Relative: 96 %
Lymphs Abs: 56.6 10*3/uL — ABNORMAL HIGH (ref 0.7–4.0)
MCH: 31.6 pg (ref 26.0–34.0)
MCHC: 33.2 g/dL (ref 30.0–36.0)
MCV: 95.2 fL (ref 80.0–100.0)
Monocytes Absolute: 0.2 10*3/uL (ref 0.1–1.0)
Monocytes Relative: 0 %
Neutro Abs: 2.1 10*3/uL (ref 1.7–7.7)
Neutrophils Relative %: 4 %
Platelet Count: 101 10*3/uL — ABNORMAL LOW (ref 150–400)
RBC: 4.34 MIL/uL (ref 4.22–5.81)
RDW: 14.1 % (ref 11.5–15.5)
Smear Review: NORMAL
WBC Count: 59 10*3/uL (ref 4.0–10.5)
nRBC: 0 % (ref 0.0–0.2)

## 2022-05-24 LAB — CMP (CANCER CENTER ONLY)
ALT: <5 U/L (ref 0–44)
AST: 14 U/L — ABNORMAL LOW (ref 15–41)
Albumin: 4.3 g/dL (ref 3.5–5.0)
Alkaline Phosphatase: 59 U/L (ref 38–126)
Anion gap: 3 — ABNORMAL LOW (ref 5–15)
BUN: 25 mg/dL — ABNORMAL HIGH (ref 8–23)
CO2: 31 mmol/L (ref 22–32)
Calcium: 9.7 mg/dL (ref 8.9–10.3)
Chloride: 107 mmol/L (ref 98–111)
Creatinine: 1.23 mg/dL (ref 0.61–1.24)
GFR, Estimated: >60 mL/min (ref >60)
Glucose, Bld: 112 mg/dL — ABNORMAL HIGH (ref 70–99)
Potassium: 4.6 mmol/L (ref 3.5–5.1)
Sodium: 141 mmol/L (ref 135–145)
Total Bilirubin: 1.3 mg/dL — ABNORMAL HIGH (ref 0.3–1.2)
Total Protein: 6 g/dL — ABNORMAL LOW (ref 6.5–8.1)

## 2022-05-24 LAB — LACTATE DEHYDROGENASE: LDH: 98 U/L (ref 98–192)

## 2022-05-25 ENCOUNTER — Ambulatory Visit (INDEPENDENT_AMBULATORY_CARE_PROVIDER_SITE_OTHER): Payer: Medicare Other | Admitting: Podiatry

## 2022-05-25 DIAGNOSIS — M79675 Pain in left toe(s): Secondary | ICD-10-CM | POA: Diagnosis not present

## 2022-05-25 DIAGNOSIS — L84 Corns and callosities: Secondary | ICD-10-CM

## 2022-05-25 DIAGNOSIS — B351 Tinea unguium: Secondary | ICD-10-CM

## 2022-05-25 DIAGNOSIS — C9110 Chronic lymphocytic leukemia of B-cell type not having achieved remission: Secondary | ICD-10-CM | POA: Diagnosis not present

## 2022-05-25 DIAGNOSIS — M79672 Pain in left foot: Secondary | ICD-10-CM | POA: Diagnosis not present

## 2022-05-25 DIAGNOSIS — G609 Hereditary and idiopathic neuropathy, unspecified: Secondary | ICD-10-CM | POA: Diagnosis not present

## 2022-05-25 DIAGNOSIS — M79674 Pain in right toe(s): Secondary | ICD-10-CM | POA: Diagnosis not present

## 2022-05-25 DIAGNOSIS — D696 Thrombocytopenia, unspecified: Secondary | ICD-10-CM

## 2022-05-25 HISTORY — DX: Hereditary and idiopathic neuropathy, unspecified: G60.9

## 2022-05-25 NOTE — Patient Instructions (Signed)
Look for urea 40% cream or ointment and apply to the thickened dry skin / calluses. This can be bought over the counter, at a pharmacy or online such as [employer].   Silicone toe caps can be bought on [employer]

## 2022-05-25 NOTE — Progress Notes (Signed)
  Subjective:  Patient ID: Frederick Pham, male    DOB: Aug 04, 1950,  MRN: [id number]  Chief Complaint  Patient presents with   Nail Problem    6 months for follow up after nail fungus laser treatment, callous trim    71 y.o. male presents with the above complaint. History confirmed with patient.  He returns today for follow-up he has had multiple sessions of laser so far.  His wife feels like there is improvement.  He is not sure himself how much improvement has happened.  He does have a painful callus on the left hallux.  Objective:  Physical Exam: warm, good capillary refill, no trophic changes or ulcerative lesions, normal DP and PT pulses, and some abnormal sensory exam.  With decree sensation in the toes Left Foot: dystrophic yellowed discolored nail plates with subungual debris, painful callus on the left hallux. Right Foot: dystrophic yellowed discolored nail plates with subungual debris  LFTs normal         Assessment:   1. Callus of foot   2. Pain due to onychomycosis of toenails of both feet   3. Left foot pain   4. Thrombocytopenia ([place])   5. Chronic lymphocytic leukemia, Rai stage I ([place])   6. Idiopathic neuropathy      Plan:  Patient was evaluated and treated and all questions answered.    Discussed the etiology and treatment options for the condition in detail with the patient. Educated patient on the topical and oral treatment options for mycotic nails. Recommended debridement of the nails today. Sharp and mechanical debridement performed of all painful and mycotic nails today. Nails debrided in length and thickness using a nail nipper to level of comfort. Discussed treatment options including appropriate shoe gear. Follow up as needed for painful nails.  We will continue with laser treatment for the onychomycosis, he will be scheduled for this between now and the next visit I will reevaluate him then in 6 months.  Photographs taken today.  Slight  improvement we will reevaluate at his next visit   All symptomatic hyperkeratoses were safely debrided with a sterile #15 blade to patient's level of comfort without incident. We discussed preventative and palliative care of these lesions including supportive and accommodative shoegear, padding, prefabricated and custom molded accommodative orthoses, use of a pumice stone and lotions/creams daily.  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   No follow-ups on file.

## 2022-06-19 ENCOUNTER — Ambulatory Visit (INDEPENDENT_AMBULATORY_CARE_PROVIDER_SITE_OTHER): Payer: Medicare Other | Admitting: *Deleted

## 2022-06-19 ENCOUNTER — Ambulatory Visit: Payer: Medicare Other | Admitting: Podiatry

## 2022-06-19 DIAGNOSIS — B351 Tinea unguium: Secondary | ICD-10-CM

## 2022-06-19 NOTE — Progress Notes (Signed)
Patient presents today for the 5th laser treatment. Diagnosed with onychomycosis by Dr. Sherryle Lis.   Toenail most affected bilateral hallux.   All other systems are negative.   Nails were filed thin. Laser therapy was administered to 1-5 toenails bilateral and patient tolerated the treatment well. All safety precautions were in place.      Follow up in 8 weeks for laser # 6

## 2022-07-04 ENCOUNTER — Encounter: Payer: Self-pay | Admitting: Podiatry

## 2022-07-05 NOTE — Telephone Encounter (Signed)
Please advise 

## 2022-07-21 ENCOUNTER — Telehealth (INDEPENDENT_AMBULATORY_CARE_PROVIDER_SITE_OTHER): Payer: Medicare Other | Admitting: Student

## 2022-07-21 ENCOUNTER — Telehealth: Payer: Self-pay | Admitting: *Deleted

## 2022-07-21 ENCOUNTER — Encounter: Payer: Self-pay | Admitting: Student

## 2022-07-21 DIAGNOSIS — U071 COVID-19: Secondary | ICD-10-CM | POA: Diagnosis not present

## 2022-07-21 MED ORDER — NIRMATRELVIR/RITONAVIR (PAXLOVID) TABLET (RENAL DOSING): 2.0000 | ORAL_TABLET | Freq: Two times a day (BID) | ORAL | 0 refills | Status: AC

## 2022-07-21 NOTE — Progress Notes (Unsigned)
  This service is provided via telemedicine  No vital signs collected/recorded due to the encounter was a telemedicine visit.   Location of patient (ex: home, work):  Home  Patient consents to a telephone visit:  Yes  Location of the provider (ex: office, home):  [facility]  Name of any referring provider:  na  Names of all persons participating in the telemedicine service and their role in the encounter:  Frederick Pham, Patient, Rafael Bihari, CMA, Dewayne Shorter, MD  Time spent on call:  7:16

## 2022-07-21 NOTE — Telephone Encounter (Signed)
Mr. daughtry, dobek are scheduled for a virtual visit with your provider today.    Just as we do with appointments in the office, we must obtain your consent to participate.  Your consent will be active for this visit and any virtual visit you Vaiden Adames have with one of our providers in the next 365 days.    If you have a MyChart account, I can also send a copy of this consent to you electronically.  All virtual visits are billed to your insurance company just like a traditional visit in the office.  As this is a virtual visit, video technology does not allow for your provider to perform a traditional examination.  This Azir Muzyka limit your provider's ability to fully assess your condition.  If your provider identifies any concerns that need to be evaluated in person or the need to arrange testing such as labs, EKG, etc, we will make arrangements to do so.    Although advances in technology are sophisticated, we cannot ensure that it will always work on either your end or our end.  If the connection with a video visit is poor, we Ka Bench have to switch to a telephone visit.  With either a video or telephone visit, we are not always able to ensure that we have a secure connection.   I need to obtain your verbal consent now.   Are you willing to proceed with your visit today?   JAYVONNI CHINA has provided verbal consent on 07/21/2022 for a virtual visit (video or telephone).   MayAlbertina [place], [place] 07/21/2022  3:07 PM

## 2022-07-21 NOTE — Progress Notes (Signed)
Location:  Virtual Visit     Place of Service:   [facility]  Provider: Gearold Wainer  Code Status: Full Code Goals of Care:     09/13/2021    1:46 AM  Advanced Directives  Does Patient Have a Medical Advance Directive? No  Would patient like information on creating a medical advance directive? No - Patient declined     Chief Complaint  Patient presents with   Acute Visit    Tested Positive today with Covid. Runny nose, congestion with no fever. Requesting Paxlovid     HPI: Patient is a 72 y.o. male seen today for an acute visit for COVID infection Started 2 days ago. Sneezing and sniffling. No fever or body aches. He took an antihistamine this morning for his symptoms but not coughing since then. His wife also tested positive. She was having the same symptoms as well.   He took claritin which helped some with his symptoms. Last time he had a cough that didn't improve and flavors changed for him.   Past Medical History:  Diagnosis Date   Arthritis    CLL (chronic lymphocytic leukemia) ([place])     Past Surgical History:  Procedure Laterality Date   FRACTURE SURGERY     age 87   TONSILLECTOMY     age 46   TOTAL HIP ARTHROPLASTY Left 09/14/2021   Procedure: TOTAL HIP ARTHROPLASTY ANTERIOR APPROACH;  Surgeon: Gaynelle Arabian, MD;  Location: [facility];  Service: Orthopedics;  Laterality: Left;   TOTAL KNEE ARTHROPLASTY Right 09/05/2021   Procedure: TOTAL KNEE ARTHROPLASTY;  Surgeon: Gaynelle Arabian, MD;  Location: [facility];  Service: Orthopedics;  Laterality: Right;   WISDOM TOOTH EXTRACTION      Allergies  Allergen Reactions   Morphine Other (See Comments)    " Caused extreme confusion and combativeness "     Outpatient Encounter Medications as of 07/21/2022  Medication Sig   acetaminophen (TYLENOL) 325 MG tablet Take 1-2 tablets (325-650 mg total) by mouth every 6 (six) hours as needed for mild pain (pain score 1-3 or temp > 100.5).   carbidopa-levodopa (SINEMET IR) 25-100 MG tablet  Take 1 tablet by mouth 3 (three) times daily.   No facility-administered encounter medications on file as of 07/21/2022.    Review of Systems:  Review of Systems  Health Maintenance  Topic Date Due   Hepatitis C Screening  Never done   DTaP/Tdap/Td (1 - Tdap) Never done   Zoster Vaccines- Shingrix (1 of 2) Never done   COLONOSCOPY (Pts 45-55yr Insurance coverage will need to be confirmed)  Never done   INFLUENZA VACCINE  01/10/2022   COVID-19 Vaccine (6 - 2023-24 season) 02/10/2022   Medicare Annual Wellness (AWV)  03/17/2022   Pneumonia Vaccine 72 Years old  Completed   HPV VACCINES  Aged Out    Physical Exam: There were no vitals filed for this visit. There is no height or weight on file to calculate BMI. Physical Exam Not performed given virtual visit.   Labs reviewed: Basic Metabolic Panel: Recent Labs    09/16/21 0317 11/24/21 1022 05/24/22 1054  NA 143 140 141  K 3.5 4.6 4.6  CL 108 105 107  CO2 28 31 31  $ GLUCOSE 136* 107* 112*  BUN 29* 31* 25*  CREATININE 0.89 1.34* 1.23  CALCIUM 8.3* 9.7 9.7   Liver Function Tests: Recent Labs    08/24/21 0951 11/24/21 1022 05/24/22 1054  AST 16 13* 14*  ALT 16 <5 <  5  ALKPHOS 69 86 59  BILITOT 1.3* 0.8 1.3*  PROT 6.7 6.4* 6.0*  ALBUMIN 4.4 4.2 4.3   No results for input(s): "LIPASE", "AMYLASE" in the last 8760 hours. No results for input(s): "AMMONIA" in the last 8760 hours. CBC: Recent Labs    09/12/21 2341 09/15/21 0247 09/17/21 0311 11/24/21 1022 05/24/22 1054  WBC 50.9*   < > 50.3* 41.8* 59.0*  NEUTROABS 2.5  --   --  2.2 2.1  HGB 9.8*   < > 8.0* 12.9* 13.7  HCT 30.1*   < > 25.5* 39.0 41.3  MCV 95.9   < > 99.2 91.8 95.2  PLT 163   < > 175 122* 101*   < > = values in this interval not displayed.   Lipid Panel: No results for input(s): "CHOL", "HDL", "LDLCALC", "TRIG", "CHOLHDL", "LDLDIRECT" in the last 8760 hours. No results found for: "HGBA1C"  Procedures since last visit: No results  found.  Assessment/Plan COVID-19 - Plan: nirmatrelvir/ritonavir, renal dosing, (PAXLOVID) 10 x 150 MG & 10 x 100MG TABS No medication changes necessary. F/u prn.   Labs/tests ordered:  * No order type specified * Next appt:  07/21/2022

## 2022-08-14 ENCOUNTER — Other Ambulatory Visit: Payer: Medicare Other

## 2022-08-22 ENCOUNTER — Other Ambulatory Visit: Payer: Self-pay | Admitting: *Deleted

## 2022-08-22 ENCOUNTER — Other Ambulatory Visit: Payer: Self-pay | Admitting: Hematology and Oncology

## 2022-08-22 DIAGNOSIS — C9110 Chronic lymphocytic leukemia of B-cell type not having achieved remission: Secondary | ICD-10-CM

## 2022-08-23 ENCOUNTER — Telehealth: Payer: Self-pay | Admitting: *Deleted

## 2022-08-23 ENCOUNTER — Inpatient Hospital Stay: Payer: Medicare Other | Attending: Hematology and Oncology

## 2022-08-23 ENCOUNTER — Other Ambulatory Visit: Payer: Self-pay

## 2022-08-23 DIAGNOSIS — C9110 Chronic lymphocytic leukemia of B-cell type not having achieved remission: Secondary | ICD-10-CM | POA: Diagnosis present

## 2022-08-23 LAB — CBC WITH DIFFERENTIAL (CANCER CENTER ONLY)
Abs Immature Granulocytes: 0.04 10*3/uL (ref 0.00–0.07)
Basophils Absolute: 0 10*3/uL (ref 0.0–0.1)
Basophils Relative: 0 %
Eosinophils Absolute: 0 10*3/uL (ref 0.0–0.5)
Eosinophils Relative: 0 %
HCT: 41.2 % (ref 39.0–52.0)
Hemoglobin: 13.7 g/dL (ref 13.0–17.0)
Immature Granulocytes: 0 %
Lymphocytes Relative: 97 %
Lymphs Abs: 63.4 10*3/uL — ABNORMAL HIGH (ref 0.7–4.0)
MCH: 32 pg (ref 26.0–34.0)
MCHC: 33.3 g/dL (ref 30.0–36.0)
MCV: 96.3 fL (ref 80.0–100.0)
Monocytes Absolute: 0.3 10*3/uL (ref 0.1–1.0)
Monocytes Relative: 0 %
Neutro Abs: 1.8 10*3/uL (ref 1.7–7.7)
Neutrophils Relative %: 3 %
Platelet Count: 97 10*3/uL — ABNORMAL LOW (ref 150–400)
RBC: 4.28 MIL/uL (ref 4.22–5.81)
RDW: 14.6 % (ref 11.5–15.5)
Smear Review: NORMAL
WBC Count: 65.6 10*3/uL (ref 4.0–10.5)
nRBC: 0 % (ref 0.0–0.2)

## 2022-08-23 LAB — LACTATE DEHYDROGENASE: LDH: 115 U/L (ref 98–192)

## 2022-08-23 NOTE — Telephone Encounter (Signed)
CRITICAL VALUE STICKER  CRITICAL VALUE: WBC 65.6  RECEIVER (on-site recipient of call): Velna Ochs RN  DATE & TIME NOTIFIED: 08/23/22 @ 1128  MESSENGER (representative from lab): Heather  MD NOTIFIED: Dr Lorenso Courier  TIME OF NOTIFICATION:1135  RESPONSE:  MD aware

## 2022-08-29 LAB — CMP (CANCER CENTER ONLY)
ALT: <5 U/L — ABNORMAL LOW (ref 10–47)
AST: 19 U/L (ref 15–41)
Albumin: 4.5 g/dL (ref 3.5–5.0)
Alkaline Phosphatase: 74 U/L (ref 38–126)
Anion gap: 6 (ref 5–15)
BUN: 22 mg/dL (ref 8–23)
CO2: 30 mmol/L (ref 22–32)
Calcium: 9.6 mg/dL (ref 8.9–10.3)
Chloride: 105 mmol/L (ref 98–111)
Creatinine: 1.35 mg/dL — ABNORMAL HIGH (ref 0.61–1.24)
GFR, Estimated: 56 mL/min — ABNORMAL LOW (ref >60)
Glucose, Bld: 116 mg/dL — ABNORMAL HIGH (ref 70–99)
Potassium: 4 mmol/L (ref 3.5–5.1)
Sodium: 141 mmol/L (ref 135–145)
Total Bilirubin: 1.3 mg/dL — ABNORMAL HIGH (ref 0.3–1.2)
Total Protein: 6.6 g/dL (ref 6.5–8.1)

## 2022-09-20 ENCOUNTER — Ambulatory Visit: Payer: Medicare Other | Admitting: Family

## 2022-09-27 DIAGNOSIS — Z96642 Presence of left artificial hip joint: Secondary | ICD-10-CM | POA: Insufficient documentation

## 2022-09-27 DIAGNOSIS — Z96649 Presence of unspecified artificial hip joint: Secondary | ICD-10-CM

## 2022-09-27 HISTORY — DX: Presence of left artificial hip joint: Z96.642

## 2022-09-27 HISTORY — DX: Presence of unspecified artificial hip joint: Z96.649

## 2022-10-05 ENCOUNTER — Telehealth (INDEPENDENT_AMBULATORY_CARE_PROVIDER_SITE_OTHER): Payer: Medicare Other | Admitting: Orthopedic Surgery

## 2022-10-05 ENCOUNTER — Encounter: Payer: Medicare Other | Admitting: Adult Health

## 2022-10-05 ENCOUNTER — Encounter: Payer: Self-pay | Admitting: Orthopedic Surgery

## 2022-10-05 DIAGNOSIS — C9110 Chronic lymphocytic leukemia of B-cell type not having achieved remission: Secondary | ICD-10-CM

## 2022-10-05 DIAGNOSIS — G20A1 Parkinson's disease without dyskinesia, without mention of fluctuations: Secondary | ICD-10-CM

## 2022-10-05 DIAGNOSIS — K5901 Slow transit constipation: Secondary | ICD-10-CM

## 2022-10-05 NOTE — Progress Notes (Signed)
   This service is provided via telemedicine  No vital signs collected/recorded due to the encounter was a telemedicine visit.   Location of patient (ex: home, work):  Home  Patient consents to a telephone visit:  Yes  Location of the provider (ex: office, home):  [employer].  Name of any referring provider:  [name], NP   Names of all persons participating in the telemedicine service and their role in the encounter:  Patient, Spouse Azzie Roup, RMA, Hazle Nordmann, NP.    Time spent on call: 8 minutes spent on the phone with Medical Assistant.

## 2022-10-05 NOTE — Progress Notes (Signed)
Careteam: Patient Care Team: [name], NP as PCP - General (Adult Health Nurse Practitioner) Edwin Cap, DPM as Consulting Physician (Podiatry) Ollen Gross, MD as Consulting Physician (Orthopedic Surgery)  Seen by: Hazle Nordmann, AGNP-C  PLACE OF SERVICE:  [facility]  Advanced Directive information Does Patient Have a Medical Advance Directive?: Yes, Type of Advance Directive: Healthcare Power of Attorney, Does patient want to make changes to medical advance directive?: No - Patient declined  Allergies  Allergen Reactions   Morphine Other (See Comments)    " Caused extreme confusion and combativeness "     Chief Complaint  Patient presents with   Transitions Of Care    [facility] 10/04/2022     HPI: Patient is a 72 y.o. male seen today for video visit s/p ED visit 04/24 due to constipation.    Wife present during encounter.   04/24 he had not had a bowel movement in several days. He began to have abdominal discomfort and presented to ED. KUB confirmed constipation. He was given soap suds enema and miralax was prescribed. He had a large bowel movement prior to discharge home.   Within past year he was diagnosed with parkinson's disease by Dr. Teresa Pham. Initially thought to be osteoarthritis of knee, he had right knee arthroplasty 09/05/2021 which was complicated with left hip fracture 04/03. Hip fracture related to mechanical fall. Prior to these surgeries he had abnormal gait and had fallen a few times. 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 He was started on gabapentin but he is not taking at this time. He is also on Sinemet. Requesting neuro consult with [place]. He reports fall 2 weeks ago. He landed on tailbone. He was evaluated by [facility]> no fracture. Wife has consulted HHPT services. At this time he is having trouble standing/ pivot transfer. He is unable to ambulate on own.   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   Review of Systems:  Review of Systems  Constitutional:  Negative for chills and fever.  HENT: Negative.    Eyes:  Negative for blurred vision.  Respiratory:  Negative for cough, shortness of breath and wheezing.   Cardiovascular:  Negative for chest pain and leg swelling.  Gastrointestinal:  Negative for abdominal pain and blood in stool.  Genitourinary:  Negative for dysuria.  Musculoskeletal:  Positive for falls and joint pain.  Skin: Negative.   Neurological:  Positive for sensory change and weakness. Negative for dizziness and headaches.  Psychiatric/Behavioral:  Negative for depression. The patient is not nervous/anxious.     Past Medical History:  Diagnosis Date   Arthritis    CLL (chronic lymphocytic leukemia)    Past Surgical History:  Procedure Laterality Date   FRACTURE SURGERY     age 66   TONSILLECTOMY     age 43   TOTAL HIP ARTHROPLASTY Left 09/14/2021   Procedure: TOTAL HIP ARTHROPLASTY ANTERIOR APPROACH;  Surgeon: Ollen Gross, MD;  Location: [facility];  Service: Orthopedics;  Laterality: Left;   TOTAL KNEE ARTHROPLASTY Right 09/05/2021   Procedure: TOTAL KNEE ARTHROPLASTY;  Surgeon: Ollen Gross, MD;  Location: [facility];  Service: Orthopedics;  Laterality: Right;   WISDOM TOOTH EXTRACTION     Social History:   reports that he quit smoking about 32 years ago. His smoking use included cigarettes. He has a 10.00  pack-year smoking history. He has never used smokeless tobacco. He reports current alcohol use of about 1.0 - 2.0 standard drink of alcohol per week. He reports that he does not use drugs.  Family History  Adopted: Yes    Medications: Patient's Medications  New Prescriptions   No medications on file  Previous Medications   ACETAMINOPHEN (TYLENOL) 325 MG TABLET    Take 1-2 tablets (325-650 mg total) by mouth every 6 (six) hours as needed for mild pain (pain  score 1-3 or temp > 100.5).   CARBIDOPA-LEVODOPA (SINEMET IR) 25-100 MG TABLET    Take 1 tablet by mouth 3 (three) times daily.   IBUPROFEN PO    Take 1 tablet by mouth as needed.  Modified Medications   No medications on file  Discontinued Medications   No medications on file    Physical Exam:  There were no vitals filed for this visit. There is no height or weight on file to calculate BMI. Wt Readings from Last 3 Encounters:  05/24/22 151 lb 8 oz (68.7 kg)  11/24/21 145 lb 1.6 oz (65.8 kg)  10/13/21 145 lb 8 oz (66 kg)    Physical Exam Vitals (Exam limited due to virtual visit) reviewed.  Constitutional:      General: He is not in acute distress. Neurological:     Mental Status: He is alert.     Labs reviewed: Basic Metabolic Panel: Recent Labs    11/24/21 1022 05/24/22 1054 08/23/22 1113  NA 140 141 141  K 4.6 4.6 4.0  CL 105 107 105  CO2 31 31 30   GLUCOSE 107* 112* 116*  BUN 31* 25* 22  CREATININE 1.34* 1.23 1.35*  CALCIUM 9.7 9.7 9.6   Liver Function Tests: Recent Labs    11/24/21 1022 05/24/22 1054 08/23/22 1113  AST 13* 14* 19  ALT <5 <5 <5*  ALKPHOS 86 59 74  BILITOT 0.8 1.3* 1.3*  PROT 6.4* 6.0* 6.6  ALBUMIN 4.2 4.3 4.5   No results for input(s): "LIPASE", "AMYLASE" in the last 8760 hours. No results for input(s): "AMMONIA" in the last 8760 hours. CBC: Recent Labs    11/24/21 1022 05/24/22 1054 08/23/22 1113  WBC 41.8* 59.0* 65.6*  NEUTROABS 2.2 2.1 1.8  HGB 12.9* 13.7 13.7  HCT 39.0 41.3 41.2  MCV 91.8 95.2 96.3  PLT 122* 101* 97*   Lipid Panel: No results for input(s): "CHOL", "HDL", "LDLCALC", "TRIG", "CHOLHDL", "LDLDIRECT" in the last 8760 hours. TSH: No results for input(s): "TSH" in the last 8760 hours. A1C: No results found for: "HGBA1C"   Assessment/Plan 1. Parkinson's disease without dyskinesia or fluctuating manifestations - diagnosed by Dr. Teresa Pham - progressive physical decline, unable to ambulate  - cont HHPT -  wife requesting [name] referral  - cont sinemet - discussed falls prevention - Ambulatory referral to Neurology  2. Slow transit constipation - ED visit 04/24 - KUB noted constipation - given soap sud enema and miralax  - denies abdominal pain - discussed hydration - cont miralax prn  3. CLL (chronic lymphocytic leukemia) (HCC) - followed by hematology/oncology - appears to be stage 1 with lymphocytosis and lymphadenopathy   Virtual Visit   I connected with Si Raider via virtual visit and verified that I am speaking with the correct person using two identifiers.  Location:[facility] Patient: Frederick Pham Provider: [name], NP    I discussed the limitations, risks, security and privacy concerns of performing an evaluation and management service  by telephone and the availability of in person appointments. I also discussed with the patient that there may be a patient responsible charge related to this service. The patient expressed understanding and agreed to proceed.   I discussed the assessment and treatment plan with the patient. The patient was provided an opportunity to ask questions and all were answered. The patient agreed with the plan and demonstrated an understanding of the instructions.   The patient was advised to call back or seek an in-person evaluation if the symptoms worsen or if the condition fails to improve as anticipated.  I provided 18 minutes of face-to-face time during this encounter.  Frazier Balfour Coletta Memos, AGNP Avs printed and mailed   Next appt: Visit date not found  Vanessa Kampf Scherry Ran  [facility] [phone]

## 2022-10-06 ENCOUNTER — Encounter: Payer: Self-pay | Admitting: Neurology

## 2022-10-10 ENCOUNTER — Telehealth: Payer: Self-pay

## 2022-10-10 NOTE — Telephone Encounter (Addendum)
Left detailed message on wife's voicemail with amy's response.   [name], NP  to Me     10/10/22 12:21 PM  Please call 911 or report to the emergency room for evaluation.

## 2022-10-10 NOTE — Telephone Encounter (Signed)
[name] called stating that she needs some medical attention for patient. She says that she feels that patient is dehydrated. She states that patient is almost totally immobile now and delusional. She says that patient is refusing to go to ER. She also says that she doesn't think patient will go to the hospital if EMS was called. She says patient will say she is trying to control him nd hold him against his will. Please advise.  Message sent to Hazle Nordmann, NP [facility]

## 2022-10-11 NOTE — Telephone Encounter (Signed)
LATE ENTRY:  Spoke with patient's wife on yesterday she returned call and reply from Hazle Nordmann, NP given to her. She verbalized her understanding.

## 2022-10-13 ENCOUNTER — Emergency Department (HOSPITAL_COMMUNITY): Payer: Medicare Other

## 2022-10-13 ENCOUNTER — Other Ambulatory Visit: Payer: Self-pay

## 2022-10-13 ENCOUNTER — Inpatient Hospital Stay (HOSPITAL_COMMUNITY): Payer: Medicare Other

## 2022-10-13 ENCOUNTER — Encounter (HOSPITAL_COMMUNITY): Payer: Self-pay

## 2022-10-13 ENCOUNTER — Inpatient Hospital Stay (HOSPITAL_COMMUNITY)
Admission: EM | Admit: 2022-10-13 | Discharge: 2022-10-16 | DRG: 682 | Disposition: A | Discharge status: Home Health | Payer: Medicare Other | Attending: Internal Medicine | Admitting: Internal Medicine

## 2022-10-13 DIAGNOSIS — G9341 Metabolic encephalopathy: Secondary | ICD-10-CM | POA: Diagnosis present

## 2022-10-13 DIAGNOSIS — Z79899 Other long term (current) drug therapy: Secondary | ICD-10-CM | POA: Diagnosis not present

## 2022-10-13 DIAGNOSIS — Z781 Physical restraint status: Secondary | ICD-10-CM | POA: Diagnosis not present

## 2022-10-13 DIAGNOSIS — Z87891 Personal history of nicotine dependence: Secondary | ICD-10-CM

## 2022-10-13 DIAGNOSIS — Z9181 History of falling: Secondary | ICD-10-CM

## 2022-10-13 DIAGNOSIS — F0280 Dementia in other diseases classified elsewhere without behavioral disturbance: Secondary | ICD-10-CM | POA: Diagnosis present

## 2022-10-13 DIAGNOSIS — C9110 Chronic lymphocytic leukemia of B-cell type not having achieved remission: Secondary | ICD-10-CM | POA: Diagnosis present

## 2022-10-13 DIAGNOSIS — E860 Dehydration: Secondary | ICD-10-CM | POA: Diagnosis present

## 2022-10-13 DIAGNOSIS — N179 Acute kidney failure, unspecified: Principal | ICD-10-CM | POA: Diagnosis present

## 2022-10-13 DIAGNOSIS — Z96642 Presence of left artificial hip joint: Secondary | ICD-10-CM | POA: Diagnosis present

## 2022-10-13 DIAGNOSIS — N320 Bladder-neck obstruction: Secondary | ICD-10-CM | POA: Diagnosis present

## 2022-10-13 DIAGNOSIS — G20A1 Parkinson's disease without dyskinesia, without mention of fluctuations: Secondary | ICD-10-CM | POA: Diagnosis present

## 2022-10-13 DIAGNOSIS — L89896 Pressure-induced deep tissue damage of other site: Secondary | ICD-10-CM | POA: Diagnosis present

## 2022-10-13 DIAGNOSIS — R296 Repeated falls: Secondary | ICD-10-CM | POA: Diagnosis present

## 2022-10-13 DIAGNOSIS — M48061 Spinal stenosis, lumbar region without neurogenic claudication: Secondary | ICD-10-CM | POA: Diagnosis present

## 2022-10-13 DIAGNOSIS — Z885 Allergy status to narcotic agent status: Secondary | ICD-10-CM | POA: Diagnosis not present

## 2022-10-13 DIAGNOSIS — Z96651 Presence of right artificial knee joint: Secondary | ICD-10-CM | POA: Diagnosis present

## 2022-10-13 DIAGNOSIS — R339 Retention of urine, unspecified: Principal | ICD-10-CM | POA: Diagnosis present

## 2022-10-13 DIAGNOSIS — G20C Parkinsonism, unspecified: Secondary | ICD-10-CM | POA: Diagnosis present

## 2022-10-13 DIAGNOSIS — M47819 Spondylosis without myelopathy or radiculopathy, site unspecified: Secondary | ICD-10-CM | POA: Diagnosis present

## 2022-10-13 DIAGNOSIS — N139 Obstructive and reflux uropathy, unspecified: Secondary | ICD-10-CM | POA: Diagnosis present

## 2022-10-13 DIAGNOSIS — R4182 Altered mental status, unspecified: Secondary | ICD-10-CM | POA: Diagnosis present

## 2022-10-13 DIAGNOSIS — E875 Hyperkalemia: Secondary | ICD-10-CM | POA: Diagnosis present

## 2022-10-13 DIAGNOSIS — E872 Acidosis, unspecified: Secondary | ICD-10-CM | POA: Diagnosis present

## 2022-10-13 DIAGNOSIS — D696 Thrombocytopenia, unspecified: Secondary | ICD-10-CM | POA: Diagnosis present

## 2022-10-13 DIAGNOSIS — M1990 Unspecified osteoarthritis, unspecified site: Secondary | ICD-10-CM | POA: Diagnosis present

## 2022-10-13 HISTORY — DX: Acute kidney failure, unspecified: N17.9

## 2022-10-13 HISTORY — DX: Metabolic encephalopathy: G93.41

## 2022-10-13 LAB — URINALYSIS, W/ REFLEX TO CULTURE (INFECTION SUSPECTED)
Bacteria, UA: NONE SEEN
Bilirubin Urine: NEGATIVE
Glucose, UA: NEGATIVE mg/dL
Ketones, ur: NEGATIVE mg/dL
Leukocytes,Ua: NEGATIVE
Nitrite: NEGATIVE
Protein, ur: NEGATIVE mg/dL
Specific Gravity, Urine: 1.014 (ref 1.005–1.030)
pH: 5 (ref 5.0–8.0)

## 2022-10-13 LAB — BASIC METABOLIC PANEL
Anion gap: 15 (ref 5–15)
Anion gap: 10 (ref 5–15)
BUN: 140 mg/dL — ABNORMAL HIGH (ref 8–23)
BUN: 95 mg/dL — ABNORMAL HIGH (ref 8–23)
CO2: 13 mmol/L — ABNORMAL LOW (ref 22–32)
CO2: 18 mmol/L — ABNORMAL LOW (ref 22–32)
Calcium: 8.3 mg/dL — ABNORMAL LOW (ref 8.9–10.3)
Calcium: 8.7 mg/dL — ABNORMAL LOW (ref 8.9–10.3)
Chloride: 107 mmol/L (ref 98–111)
Chloride: 110 mmol/L (ref 98–111)
Creatinine, Ser: 7.61 mg/dL — ABNORMAL HIGH (ref 0.61–1.24)
Creatinine, Ser: 4.16 mg/dL — ABNORMAL HIGH (ref 0.61–1.24)
GFR, Estimated: 7 mL/min — ABNORMAL LOW (ref >60)
GFR, Estimated: 15 mL/min — ABNORMAL LOW (ref >60)
Glucose, Bld: 117 mg/dL — ABNORMAL HIGH (ref 70–99)
Glucose, Bld: 116 mg/dL — ABNORMAL HIGH (ref 70–99)
Potassium: 5.6 mmol/L — ABNORMAL HIGH (ref 3.5–5.1)
Potassium: 4.7 mmol/L (ref 3.5–5.1)
Sodium: 135 mmol/L (ref 135–145)
Sodium: 138 mmol/L (ref 135–145)

## 2022-10-13 LAB — RAPID URINE DRUG SCREEN, HOSP PERFORMED
Amphetamines: NOT DETECTED
Barbiturates: NOT DETECTED
Benzodiazepines: NOT DETECTED
Cocaine: NOT DETECTED
Opiates: NOT DETECTED
Tetrahydrocannabinol: NOT DETECTED

## 2022-10-13 LAB — ETHANOL: Alcohol, Ethyl (B): <10 mg/dL (ref <10)

## 2022-10-13 LAB — COMPREHENSIVE METABOLIC PANEL
ALT: 6 U/L (ref 0–44)
AST: 10 U/L — ABNORMAL LOW (ref 15–41)
Albumin: 4.2 g/dL (ref 3.5–5.0)
Alkaline Phosphatase: 183 U/L — ABNORMAL HIGH (ref 38–126)
Anion gap: 21 — ABNORMAL HIGH (ref 5–15)
BUN: 176 mg/dL — ABNORMAL HIGH (ref 8–23)
CO2: 11 mmol/L — ABNORMAL LOW (ref 22–32)
Calcium: 9 mg/dL (ref 8.9–10.3)
Chloride: 100 mmol/L (ref 98–111)
Creatinine, Ser: 11.7 mg/dL — ABNORMAL HIGH (ref 0.61–1.24)
GFR, Estimated: 4 mL/min — ABNORMAL LOW (ref >60)
Glucose, Bld: 125 mg/dL — ABNORMAL HIGH (ref 70–99)
Potassium: >7.5 mmol/L (ref 3.5–5.1)
Sodium: 132 mmol/L — ABNORMAL LOW (ref 135–145)
Total Bilirubin: 1.3 mg/dL — ABNORMAL HIGH (ref 0.3–1.2)
Total Protein: 7.6 g/dL (ref 6.5–8.1)

## 2022-10-13 LAB — DIFFERENTIAL
Abs Immature Granulocytes: 0.14 10*3/uL — ABNORMAL HIGH (ref 0.00–0.07)
Basophils Absolute: 0.1 10*3/uL (ref 0.0–0.1)
Basophils Relative: 0 %
Eosinophils Absolute: 0 10*3/uL (ref 0.0–0.5)
Eosinophils Relative: 0 %
Immature Granulocytes: 0 %
Lymphocytes Relative: 97 %
Lymphs Abs: 141.8 10*3/uL — ABNORMAL HIGH (ref 0.7–4.0)
Monocytes Absolute: 0.2 10*3/uL (ref 0.1–1.0)
Monocytes Relative: 0 %
Neutro Abs: 3.7 10*3/uL (ref 1.7–7.7)
Neutrophils Relative %: 3 %

## 2022-10-13 LAB — CBC
HCT: 41.3 % (ref 39.0–52.0)
Hemoglobin: 13 g/dL (ref 13.0–17.0)
MCH: 30.3 pg (ref 26.0–34.0)
MCHC: 31.5 g/dL (ref 30.0–36.0)
MCV: 96.3 fL (ref 80.0–100.0)
Platelets: 137 10*3/uL — ABNORMAL LOW (ref 150–400)
RBC: 4.29 MIL/uL (ref 4.22–5.81)
RDW: 16 % — ABNORMAL HIGH (ref 11.5–15.5)
WBC: 145.9 10*3/uL (ref 4.0–10.5)
nRBC: 0 % (ref 0.0–0.2)

## 2022-10-13 LAB — I-STAT CHEM 8, ED
BUN: >130 mg/dL — ABNORMAL HIGH (ref 8–23)
Calcium, Ion: 1.09 mmol/L — ABNORMAL LOW (ref 1.15–1.40)
Chloride: 107 mmol/L (ref 98–111)
Creatinine, Ser: 13.4 mg/dL — ABNORMAL HIGH (ref 0.61–1.24)
Glucose, Bld: 118 mg/dL — ABNORMAL HIGH (ref 70–99)
HCT: 40 % (ref 39.0–52.0)
Hemoglobin: 13.6 g/dL (ref 13.0–17.0)
Potassium: 7.3 mmol/L (ref 3.5–5.1)
Sodium: 129 mmol/L — ABNORMAL LOW (ref 135–145)
TCO2: 13 mmol/L — ABNORMAL LOW (ref 22–32)

## 2022-10-13 LAB — CREATININE, URINE, RANDOM: Creatinine, Urine: 86 mg/dL

## 2022-10-13 LAB — SODIUM, URINE, RANDOM: Sodium, Ur: 60 mmol/L

## 2022-10-13 LAB — CBG MONITORING, ED: Glucose-Capillary: 123 mg/dL — ABNORMAL HIGH (ref 70–99)

## 2022-10-13 LAB — PHOSPHORUS: Phosphorus: 9.4 mg/dL — ABNORMAL HIGH (ref 2.5–4.6)

## 2022-10-13 LAB — MRSA NEXT GEN BY PCR, NASAL: MRSA by PCR Next Gen: NOT DETECTED

## 2022-10-13 MED ORDER — ONDANSETRON HCL 4 MG/2ML IJ SOLN: 4.0000 mg | Freq: Four times a day (QID) | INTRAMUSCULAR | Status: DC | PRN

## 2022-10-13 MED ORDER — ALBUTEROL SULFATE (2.5 MG/3ML) 0.083% IN NEBU
5.0000 mg | INHALATION_SOLUTION | Freq: Once | RESPIRATORY_TRACT | Status: AC
  Administered 2022-10-13: 5 mg via RESPIRATORY_TRACT
  Filled 2022-10-13: qty 6

## 2022-10-13 MED ORDER — CALCIUM GLUCONATE-NACL 1-0.675 GM/50ML-% IV SOLN
1.0000 g | Freq: Once | INTRAVENOUS | Status: AC
  Administered 2022-10-13: 1000 mg via INTRAVENOUS
  Filled 2022-10-13: qty 50

## 2022-10-13 MED ORDER — HEPARIN SODIUM (PORCINE) 5000 UNIT/ML IJ SOLN
5000.0000 [IU] | Freq: Three times a day (TID) | INTRAMUSCULAR | Status: DC
  Administered 2022-10-13 – 2022-10-16 (×8): 5000 [IU] via SUBCUTANEOUS
  Filled 2022-10-13 (×8): qty 1

## 2022-10-13 MED ORDER — ORAL CARE MOUTH RINSE: 15.0000 mL | OROMUCOSAL | Status: DC | PRN

## 2022-10-13 MED ORDER — STERILE WATER FOR INJECTION IV SOLN
INTRAVENOUS | Status: DC
  Filled 2022-10-13: qty 1000
  Filled 2022-10-13 (×2): qty 150

## 2022-10-13 MED ORDER — SODIUM CHLORIDE 0.9 % IV SOLN
100.0000 mL/h | INTRAVENOUS | Status: DC
  Administered 2022-10-13: 100 mL/h via INTRAVENOUS

## 2022-10-13 MED ORDER — CARBIDOPA-LEVODOPA 25-100 MG PO TABS
1.0000 | ORAL_TABLET | Freq: Three times a day (TID) | ORAL | Status: DC
  Administered 2022-10-13 – 2022-10-16 (×9): 1 via ORAL
  Filled 2022-10-13 (×10): qty 1

## 2022-10-13 MED ORDER — SODIUM CHLORIDE 0.9 % IV BOLUS
2500.0000 mL | Freq: Once | INTRAVENOUS | Status: AC
  Administered 2022-10-13: 2500 mL via INTRAVENOUS

## 2022-10-13 MED ORDER — ACETAMINOPHEN 325 MG PO TABS: 650.0000 mg | ORAL_TABLET | Freq: Four times a day (QID) | ORAL | Status: DC | PRN

## 2022-10-13 MED ORDER — INSULIN ASPART 100 UNIT/ML IV SOLN
5.0000 [IU] | Freq: Once | INTRAVENOUS | Status: AC
  Administered 2022-10-13: 5 [IU] via INTRAVENOUS
  Filled 2022-10-13: qty 0.05

## 2022-10-13 MED ORDER — DEXTROSE 50 % IV SOLN
1.0000 | Freq: Once | INTRAVENOUS | Status: AC
  Administered 2022-10-13: 50 mL via INTRAVENOUS
  Filled 2022-10-13: qty 50

## 2022-10-13 MED ORDER — ONDANSETRON HCL 4 MG PO TABS: 4.0000 mg | ORAL_TABLET | Freq: Four times a day (QID) | ORAL | Status: DC | PRN

## 2022-10-13 MED ORDER — SODIUM ZIRCONIUM CYCLOSILICATE 10 G PO PACK
10.0000 g | PACK | Freq: Once | ORAL | Status: AC
  Administered 2022-10-13: 10 g via ORAL
  Filled 2022-10-13: qty 1

## 2022-10-13 MED ORDER — SODIUM CHLORIDE 0.9 % IV BOLUS
500.0000 mL | Freq: Once | INTRAVENOUS | Status: AC
  Administered 2022-10-13: 500 mL via INTRAVENOUS

## 2022-10-13 MED ORDER — CHLORHEXIDINE GLUCONATE CLOTH 2 % EX PADS
6.0000 | MEDICATED_PAD | Freq: Every day | CUTANEOUS | Status: DC
  Administered 2022-10-13 – 2022-10-16 (×6): 6 via TOPICAL

## 2022-10-13 MED ORDER — ORAL CARE MOUTH RINSE
15.0000 mL | OROMUCOSAL | Status: DC
  Administered 2022-10-13 – 2022-10-16 (×9): 15 mL via OROMUCOSAL

## 2022-10-13 MED ORDER — ACETAMINOPHEN 650 MG RE SUPP: 650.0000 mg | Freq: Four times a day (QID) | RECTAL | Status: DC | PRN

## 2022-10-13 NOTE — ED Triage Notes (Signed)
Per wife patient has had AMS, decrease mobility, and hallucinations.  Wife states this all started after a fall on 4/11.  Wife denies patient hitting head at this time.  Also c/o constipation last BM on 4/24 after Enema at ED.  HX CLL and parkinson's

## 2022-10-13 NOTE — Plan of Care (Signed)

## 2022-10-13 NOTE — Consult Note (Signed)
Renal Service Consult Note [facility]  Frederick Pham 10/13/2022 Frederick Krabbe, MD Requesting Physician: Dr Frederick Pham, D.   Reason for Consult: Renal failure HPI: The patient is a 72 y.o. year-old w/ PMH as below who presented to ED for AMS, poor mobility, and hallucinations which all started after a fall on 09/21/22. Hx of parkinson's and CLL.  In ED BP 150/80, HR 80-90, RR 18, on RA 94%.  Labs showed Na 132, K+ >7.5, CO2 11, BUN 176, creat 11.70.  Ca++ 9.0, albl 4.2, LFT's wnl. WBC 145 (hx CLL), Hb 13. Per ED MD the pt had a lower abd mass c/w bladder distension. Bladder scan showed > 1000 cc urine and so a foley cath was placed which returned 1700 cc urine. Labs showed severe AKI was creat 11.7 and K+ > 7.5. Creat was 1.0 one week ago on 4/24. Pt was given IV ins/ gluc/ Ca++, 5 mg alb nebs for ^K+. We are asked to see for renal failure.   Pt seen in ED. Wife provides hx, husband somnolent. Pt also confused per wife. No hx renal failure. Did have shoulder surgery and was given IV morphine and he "had to go ICU for AMS and needed a foley place as well" for about 1 week.     ROS - denies CP, no joint pain, no HA, no blurry vision, no rash, no diarrhea, no nausea/ vomiting, no dysuria, no difficulty voiding   Past Medical History  Past Medical History:  Diagnosis Date   Arthritis    CLL (chronic lymphocytic leukemia) (HCC)    Past Surgical History  Past Surgical History:  Procedure Laterality Date   FRACTURE SURGERY     age 74   TONSILLECTOMY     age 51   TOTAL HIP ARTHROPLASTY Left 09/14/2021   Procedure: TOTAL HIP ARTHROPLASTY ANTERIOR APPROACH;  Surgeon: Frederick Gross, MD;  Location: [facility];  Service: Orthopedics;  Laterality: Left;   TOTAL KNEE ARTHROPLASTY Right 09/05/2021   Procedure: TOTAL KNEE ARTHROPLASTY;  Surgeon: Frederick Gross, MD;  Location: [facility];  Service: Orthopedics;  Laterality: Right;   WISDOM TOOTH EXTRACTION     Family History  Family History   Adopted: Yes   Social History  reports that he quit smoking about 32 years ago. His smoking use included cigarettes. He has a 10.00 pack-year smoking history. He has never used smokeless tobacco. He reports current alcohol use of about 1.0 - 2.0 standard drink of alcohol per week. He reports that he does not use drugs. Allergies  Allergies  Allergen Reactions   Morphine Other (See Comments)    " Caused extreme confusion and combativeness "    Home medications Prior to Admission medications   Medication Sig Start Date End Date Taking? Authorizing Provider  acetaminophen (TYLENOL) 325 MG tablet Take 1-2 tablets (325-650 mg total) by mouth every 6 (six) hours as needed for mild pain (pain score 1-3 or temp > 100.5). 09/16/21   Frederick Pham, Frederick L, PA  carbidopa-levodopa (SINEMET IR) 25-100 MG tablet Take 1 tablet by mouth 3 (three) times daily. 11/16/21 11/11/22  Frederick Norfolk, MD  IBUPROFEN PO Take 1 tablet by mouth as needed.    [provider]     Vitals:   10/13/22 1610 10/13/22 0928 10/13/22 1200  BP: (!) 155/81  137/71  Pulse: 91  85  Resp: 18  18  Temp: 97.6 F (36.4 C)    TempSrc: Oral    SpO2: 96%  95%  Weight:  68 kg    Exam Gen older adult, sleeping No rash, cyanosis or gangrene Sclera anicteric, throat is quite dry, mouth breathing  No jvd or bruits, flat neck veins Chest clear bilat to bases, no rales/ wheezing RRR no RG Abd soft ntnd no mass or ascites +bs GU foley cath draining large amts reddish tinged clear light amber urine MS no joint effusions or deformity Ext no LE or UE edema, no wounds or ulcers Neuro is sleeping     Home meds include - sinemet IR, ibuprofen, prns     UA - neg protein, 6-10 rbc/ 0-5 wbc, no bact       Assessment/ Plan: AKI - b/Pham creat 1.0 from 10/04/22. Creat here is 11 in the setting of what looks like bladder retention and dehydration. BP's good, not in shock. Exam looks dry. Foley cath placed w/ 1700 cc urine out, and  another 500 cc since then. No acei/ ARB, possible nsaids. AKI likely due to bladder retention +/- dehydration. Is now having good UOP w/ foley cath in place. Will bolus 2.5 Pham NS and continue IVF's changing to bicarb gtt. Get urine lytes and renal US.  Metabolic acidosis - due to renal failure. Change IVF's to bicarb gtt.  Hyperkalemia - K+ > 7.5 on arrival. EKG w/o changes , NSR  and normal QRS.  Urinary retention - will need urology input at some point Parkinson's disease H/o CLL       Frederick Moselle  MD [facility] 10/13/2022, 12:53 PM  Recent Labs  Lab 10/13/22 1027 10/13/22 1033  HGB 13.0 13.6  ALBUMIN 4.2  --   CALCIUM 9.0  --   CREATININE 11.70* 13.40*  K >7.5* 7.3*   Inpatient medications:   sodium chloride 100 mL/hr (10/13/22 1056)

## 2022-10-13 NOTE — H&P (Signed)
History and Physical    Patient: Frederick Pham UJW:[id number] DOB: [birth year] DOA: 10/13/2022 DOS: the patient was seen and examined on 10/13/2022 PCP: [name], NP  Patient coming from: Home  Chief Complaint:  Chief Complaint  Patient presents with   Altered Mental Status   Weakness   HPI: Frederick Pham is a 72 y.o. male with medical history significant of osteoarthritis, CLL, thrombocytopenia, basal cell carcinoma, left femoral neck fracture, parkinsonism who is brought to the emergency department due to progressively worse altered mental status since last month after he had a fall.  The patient at that time stated that he did not hit his head, but has not been behaving like usual.  Patient has been increasingly confused particularly since last week.  He saw his primary care provider 8 days ago and was more alert at that time.  He was also constipated until he received an enema last week.  He was prescribed if MiraLAX, but has not been eating much since then.  History is taken from his wife since the patient is unable to provide much HPI.  ED course: Initial vital signs were temperature 97.6 F, pulse 91, respiration 18, BP 155/81 mmHg O2 sat 96% on room air.  The patient received 500 mL of normal saline bolus, an 5 mg albuterol neb x 1, calcium gluconate 1 g IVPB, 50 mL of dextrose 50% along with insulin 5 units.  I added Lokelma 10 g p.o. x 1.  Nephrology ordered another 2500 mL bolus followed by sodium bicarbonate 150 mEq at 125 mL/h.  Lab work: UDS is negative.  Urinalysis shows small hemoglobinuria but was otherwise unremarkable.  CBC with a white count of 145.9 with 97% lymphocytes, hemoglobin 13.0 g/dL platelets 562.  CMP showed a sodium 132, potassium greater than 7.5, chloride 100, CO2 11 with an anion gap of 21.  Glucose 125, BUN 176 and creatinine 11.7 mg/dL.  Alkaline phosphatase 183 and total bilirubin 1.3.  The rest of the LFTs are unremarkable.  Imaging: CT head without  contrast with no evidence of an acute intracranial normality.  There is minimal chronic small vessel ischemic changes within the cerebral white matter.  Moderate generalized cerebral atrophy.  Right sphenoid sinusitis.  US renal with no hydronephrosis or nephrolithiasis.  There was a small left-sided pleural effusion.   Review of Systems: As mentioned in the history of present illness. All other systems reviewed and are negative. Past Medical History:  Diagnosis Date   Arthritis    CLL (chronic lymphocytic leukemia) (HCC)    Past Surgical History:  Procedure Laterality Date   FRACTURE SURGERY     age 4   TONSILLECTOMY     age 4   TOTAL HIP ARTHROPLASTY Left 09/14/2021   Procedure: TOTAL HIP ARTHROPLASTY ANTERIOR APPROACH;  Surgeon: Ollen Gross, MD;  Location: [facility];  Service: Orthopedics;  Laterality: Left;   TOTAL KNEE ARTHROPLASTY Right 09/05/2021   Procedure: TOTAL KNEE ARTHROPLASTY;  Surgeon: Ollen Gross, MD;  Location: [facility];  Service: Orthopedics;  Laterality: Right;   WISDOM TOOTH EXTRACTION     Social History:  reports that he quit smoking about 32 years ago. His smoking use included cigarettes. He has a 10.00 pack-year smoking history. He has never used smokeless tobacco. He reports current alcohol use of about 1.0 - 2.0 standard drink of alcohol per week. He reports that he does not use drugs.  Allergies  Allergen Reactions   Morphine Other (See Comments)    "  Caused extreme confusion and combativeness "     Family History  Adopted: Yes    Prior to Admission medications   Medication Sig Start Date End Date Taking? Authorizing Provider  acetaminophen (TYLENOL) 325 MG tablet Take 1-2 tablets (325-650 mg total) by mouth every 6 (six) hours as needed for mild pain (pain score 1-3 or temp > 100.5). 09/16/21   Edmisten, Kristie L, PA  carbidopa-levodopa (SINEMET IR) 25-100 MG tablet Take 1 tablet by mouth 3 (three) times daily. 11/16/21 11/11/22  Windell Norfolk, MD  IBUPROFEN  PO Take 1 tablet by mouth as needed.    [provider]    Physical Exam: Vitals:   10/13/22 0918 10/13/22 0928 10/13/22 1200  BP: (!) 155/81  137/71  Pulse: 91  85  Resp: 18  18  Temp: 97.6 F (36.4 C)    TempSrc: Oral    SpO2: 96%  95%  Weight:  68 kg    Physical Exam Vitals and nursing note reviewed.  Constitutional:      General: He is awake. He is not in acute distress.    Appearance: He is normal weight. He is ill-appearing.  HENT:     Head: Normocephalic.     Nose: No rhinorrhea.     Mouth/Throat:     Mouth: Mucous membranes are dry.  Eyes:     General: No scleral icterus.    Pupils: Pupils are equal, round, and reactive to light.  Neck:     Vascular: No JVD.  Cardiovascular:     Rate and Rhythm: Normal rate and regular rhythm.     Heart sounds: S1 normal and S2 normal.  Pulmonary:     Effort: Pulmonary effort is normal.     Breath sounds: Normal breath sounds.  Abdominal:     General: Bowel sounds are normal. There is no distension.     Palpations: Abdomen is soft.     Tenderness: There is no abdominal tenderness. There is no right CVA tenderness, left CVA tenderness, guarding or rebound.  Musculoskeletal:     Cervical back: Neck supple.     Right lower leg: No edema.     Left lower leg: No edema.  Skin:    General: Skin is warm and dry.  Neurological:     General: No focal deficit present.     Mental Status: He is alert. He is disoriented.  Psychiatric:        Behavior: Behavior is cooperative.     Data Reviewed:  Results are pending, will review when available.  Assessment and Plan: Principal Problem:   Acute renal failure (ARF) (HCC)   AKI (acute kidney injury) (HCC) Progressive/inpatient. Continue IV fluids. Lokelma 10 g p.o. x 1 given. Avoid hypotension. Avoid nephrotoxins. Continue Foley catheterization. Monitor intake and output. Monitor renal function and electrolytes. Nephrology consult appreciated.  Active Problems:    Acute metabolic encephalopathy  Continue current therapy. Neurochecks every 4 hours. Obtain brain MRI when more alert.    Chronic lymphocytic leukemia, [facility] stage I [facility]) Follow-up with oncology as an outpatient.    Thrombocytopenia (HCC) Monitor platelet count.    Parkinsonism Will be following with neurology later this month. Continue home Sinemet dose.     Advance Care Planning:   Code Status: Full Code   Consults: Nephrology Delano Metz, MD).  Family Communication: His wife Darl Pikes was at bedside.   Severity of Illness: The appropriate patient status for this patient is INPATIENT. Inpatient status is judged to  be reasonable and necessary in order to provide the required intensity of service to ensure the patient's safety. The patient's presenting symptoms, physical exam findings, and initial radiographic and laboratory data in the context of their chronic comorbidities is felt to place them at high risk for further clinical deterioration. Furthermore, it is not anticipated that the patient will be medically stable for discharge from the hospital within 2 midnights of admission.   * I certify that at the point of admission it is my clinical judgment that the patient will require inpatient hospital care spanning beyond 2 midnights from the point of admission due to high intensity of service, high risk for further deterioration and high frequency of surveillance required.*  Author: Bobette Mo, MD 10/13/2022 1:05 PM  For on call review [email or web address].   This document was prepared using Dragon voice recognition software and may contain some unintended transcription errors.

## 2022-10-13 NOTE — ED Notes (Signed)
ED TO INPATIENT HANDOFF REPORT  ED Nurse Name and Phone #: Arnetha Gula Name/Age/Gender Frederick Pham 72 y.o. male Room/Bed: WA10/WA10  Code Status   Code Status: Full Code  Home/SNF/Other Home Patient oriented to: self Is this baseline? Yes   Triage Complete: Triage complete  Chief Complaint Acute renal failure (ARF) (HCC) [N17.9]  Triage Note Per wife patient has had AMS, decrease mobility, and hallucinations.  Wife states this all started after a fall on 4/11.  Wife denies patient hitting head at this time.  Also c/o constipation last BM on 4/24 after Enema at ED.  HX CLL and parkinson's   Allergies Allergies  Allergen Reactions   Morphine Other (See Comments)    " Caused extreme confusion and combativeness "     Level of Care/Admitting Diagnosis ED Disposition     ED Disposition  Admit   Condition  --   Comment  Hospital Area: [facility]  HOSPITAL [[id number]]  Level of Care: Progressive [102]  Admit to Progressive based on following criteria: ACUTE MENTAL DISORDER-RELATED Drug/Alcohol Ingestion/Overdose/Withdrawal, Suicidal Ideation/attempt requiring safety sitter and < Q2h monitoring/assessments, moderate to severe agitation that is managed with medication/sitter, CIWA-Ar score < 20.  Admit to Progressive based on following criteria: NEPHROLOGY stable condition requiring close monitoring for AKI, requiring Hemodialysis or Peritoneal Dialysis either from expected electrolyte imbalance, acidosis, or fluid overload that can be managed by NIPPV or high flow oxygen.  May admit patient to [facility] or [facility] if equivalent level of care is available:: No  Covid Evaluation: Asymptomatic - no recent exposure (last 10 days) testing not required  Diagnosis: Acute renal failure (ARF) [facility]) [[id number]]  Admitting Physician: Bobette Mo [[id number]]  Attending Physician: Bobette Mo [[id number]]  Certification:: I certify this patient will need  inpatient services for at least 2 midnights  Estimated Length of Stay: 2          B Medical/Surgery History Past Medical History:  Diagnosis Date   Arthritis    CLL (chronic lymphocytic leukemia) (HCC)    Past Surgical History:  Procedure Laterality Date   FRACTURE SURGERY     age 44   TONSILLECTOMY     age 44   TOTAL HIP ARTHROPLASTY Left 09/14/2021   Procedure: TOTAL HIP ARTHROPLASTY ANTERIOR APPROACH;  Surgeon: Ollen Gross, MD;  Location: [facility] ORS;  Service: Orthopedics;  Laterality: Left;   TOTAL KNEE ARTHROPLASTY Right 09/05/2021   Procedure: TOTAL KNEE ARTHROPLASTY;  Surgeon: Ollen Gross, MD;  Location: [facility];  Service: Orthopedics;  Laterality: Right;   WISDOM TOOTH EXTRACTION       A IV Location/Drains/Wounds Patient Lines/Drains/Airways Status     Active Line/Drains/Airways     Name Placement date Placement time Site Days   Peripheral IV 10/13/22 20 G Anterior;Distal;Right;Upper Arm 10/13/22  1025  Arm  less than 1   Urethral Catheter Tyton Abdallah 10/13/22  1214  --  less than 1   Incision (Closed) 09/05/21 Knee Right 09/05/21  1159  -- 403   Incision (Closed) 09/14/21 Hip Left 09/14/21  1719  -- 394            Intake/Output Last 24 hours  Intake/Output Summary (Last 24 hours) at 10/13/2022 1303 Last data filed at 10/13/2022 1131 Gross per 24 hour  Intake --  Output 1700 ml  Net -1700 ml    Labs/Imaging Results for orders placed or performed during the hospital encounter of 10/13/22 (from the past 48 hour(s))  CBG  monitoring, ED     Status: Abnormal   Collection Time: 10/13/22  9:30 AM  Result Value Ref Range   Glucose-Capillary 123 (H) 70 - 99 mg/dL    Comment: Glucose reference range applies only to samples taken after fasting for at least 8 hours.  CBC     Status: Abnormal   Collection Time: 10/13/22 10:27 AM  Result Value Ref Range   WBC 145.9 (HH) 4.0 - 10.5 K/uL    Comment: REPEATED TO VERIFY THIS CRITICAL RESULT HAS VERIFIED AND BEEN CALLED  TO CRAWFORD, D RN BY CONEKIN,BETHANY ON 05 03 2024 AT 1103, AND HAS BEEN READ BACK.     RBC 4.29 4.22 - 5.81 MIL/uL   Hemoglobin 13.0 13.0 - 17.0 g/dL   HCT 16.1 09.6 - 04.5 %   MCV 96.3 80.0 - 100.0 fL   MCH 30.3 26.0 - 34.0 pg   MCHC 31.5 30.0 - 36.0 g/dL   RDW 40.9 (H) 81.1 - 91.4 %   Platelets 137 (L) 150 - 400 K/uL   nRBC 0.0 0.0 - 0.2 %    Comment: Performed at [facility], [address] [place], [place] [address]  Differential     Status: Abnormal   Collection Time: 10/13/22 10:27 AM  Result Value Ref Range   Neutrophils Relative % 3 %   Neutro Abs 3.7 1.7 - 7.7 K/uL   Lymphocytes Relative 97 %   Lymphs Abs 141.8 (H) 0.7 - 4.0 K/uL   Monocytes Relative 0 %   Monocytes Absolute 0.2 0.1 - 1.0 K/uL   Eosinophils Relative 0 %   Eosinophils Absolute 0.0 0.0 - 0.5 K/uL   Basophils Relative 0 %   Basophils Absolute 0.1 0.0 - 0.1 K/uL   WBC Morphology ABSOLUTE LYMPHOCYTOSIS     Comment: SMUDGE CELLS   Immature Granulocytes 0 %   Abs Immature Granulocytes 0.14 (H) 0.00 - 0.07 K/uL    Comment: Performed at [facility], [address], [place], [place] [address]  Comprehensive metabolic panel     Status: Abnormal   Collection Time: 10/13/22 10:27 AM  Result Value Ref Range   Sodium 132 (L) 135 - 145 mmol/L    Comment: ELECTROLYTES REPEATED TO VERIFY   Potassium >7.5 (HH) 3.5 - 5.1 mmol/L    Comment: NO VISIBLE HEMOLYSIS CRITICAL RESULT CALLED TO, READ BACK BY AND VERIFIED WITH ORE,B. RN AT 1121 10/13/22 MULLINS,T    Chloride 100 98 - 111 mmol/L    Comment: ELECTROLYTES REPEATED TO VERIFY   CO2 11 (L) 22 - 32 mmol/L    Comment: ELECTROLYTES REPEATED TO VERIFY   Glucose, Bld 125 (H) 70 - 99 mg/dL    Comment: Glucose reference range applies only to samples taken after fasting for at least 8 hours.   BUN 176 (H) 8 - 23 mg/dL    Comment: RESULT CONFIRMED BY MANUAL DILUTION   Creatinine, Ser 11.70 (H) 0.61 - 1.24 mg/dL   Calcium 9.0 8.9  - 86.5 mg/dL   Total Protein 7.6 6.5 - 8.1 g/dL   Albumin 4.2 3.5 - 5.0 g/dL   AST 10 (L) 15 - 41 U/L   ALT 6 0 - 44 U/L   Alkaline Phosphatase 183 (H) 38 - 126 U/L   Total Bilirubin 1.3 (H) 0.3 - 1.2 mg/dL   GFR, Estimated 4 (L) >60 mL/min    Comment: (NOTE) Calculated using the CKD-EPI Creatinine Equation (2021)    Anion gap 21 (H)  5 - 15    Comment: ELECTROLYTES REPEATED TO VERIFY Performed at [facility], [address] [place], [place] [address]   Ethanol     Status: None   Collection Time: 10/13/22 10:27 AM  Result Value Ref Range   Alcohol, Ethyl (B) <10 <10 mg/dL    Comment: (NOTE) Lowest detectable limit for serum alcohol is 10 mg/dL.  For medical purposes only. Performed at [facility], [address] [place], [place] [address]   Dickie La 8, ED     Status: Abnormal   Collection Time: 10/13/22 10:33 AM  Result Value Ref Range   Sodium 129 (L) 135 - 145 mmol/L   Potassium 7.3 (HH) 3.5 - 5.1 mmol/L   Chloride 107 98 - 111 mmol/L   BUN >130 (H) 8 - 23 mg/dL   Creatinine, Ser 09.81 (H) 0.61 - 1.24 mg/dL   Glucose, Bld 191 (H) 70 - 99 mg/dL    Comment: Glucose reference range applies only to samples taken after fasting for at least 8 hours.   Calcium, Ion 1.09 (L) 1.15 - 1.40 mmol/L   TCO2 13 (L) 22 - 32 mmol/L   Hemoglobin 13.6 13.0 - 17.0 g/dL   HCT 47.8 29.5 - 62.1 %   Comment NOTIFIED PHYSICIAN   Urine rapid drug screen (hosp performed)     Status: None   Collection Time: 10/13/22 11:24 AM  Result Value Ref Range   Opiates NONE DETECTED NONE DETECTED   Cocaine NONE DETECTED NONE DETECTED   Benzodiazepines NONE DETECTED NONE DETECTED   Amphetamines NONE DETECTED NONE DETECTED   Tetrahydrocannabinol NONE DETECTED NONE DETECTED   Barbiturates NONE DETECTED NONE DETECTED    Comment: (NOTE) DRUG SCREEN FOR MEDICAL PURPOSES ONLY.  IF CONFIRMATION IS NEEDED FOR ANY PURPOSE, NOTIFY LAB WITHIN 5 DAYS.  LOWEST  DETECTABLE LIMITS FOR URINE DRUG SCREEN Drug Class                     Cutoff (ng/mL) Amphetamine and metabolites    1000 Barbiturate and metabolites    200 Benzodiazepine                 200 Opiates and metabolites        300 Cocaine and metabolites        300 THC                            50 Performed at [facility], [address], [place], [place] [address]   Urinalysis, w/ Reflex to Culture (Infection Suspected) -Urine, Catheterized     Status: Abnormal   Collection Time: 10/13/22 11:24 AM  Result Value Ref Range   Specimen Source URINE, CATHETERIZED    Color, Urine YELLOW YELLOW   APPearance CLEAR CLEAR   Specific Gravity, Urine 1.014 1.005 - 1.030   pH 5.0 5.0 - 8.0   Glucose, UA NEGATIVE NEGATIVE mg/dL   Hgb urine dipstick SMALL (A) NEGATIVE   Bilirubin Urine NEGATIVE NEGATIVE   Ketones, ur NEGATIVE NEGATIVE mg/dL   Protein, ur NEGATIVE NEGATIVE mg/dL   Nitrite NEGATIVE NEGATIVE   Leukocytes,Ua NEGATIVE NEGATIVE   RBC / HPF 6-10 0 - 5 RBC/hpf   WBC, UA 0-5 0 - 5 WBC/hpf    Comment:        Reflex urine culture not performed if WBC <=10, OR if Squamous epithelial cells >5. If Squamous epithelial cells >  5 suggest recollection.    Bacteria, UA NONE SEEN NONE SEEN   Squamous Epithelial / HPF 0-5 0 - 5 /HPF   Mucus PRESENT     Comment: Performed at [facility], [address], [place], [place] [address]   CT HEAD WO CONTRAST  Result Date: 10/13/2022 CLINICAL DATA:  Provided history: Neuro deficit, acute, stroke suspected. Additional history provided: Altered mental status, decreased mobility, hallucinations. Fall on April 11th. EXAM: CT HEAD WITHOUT CONTRAST TECHNIQUE: Contiguous axial images were obtained from the base of the skull through the vertex without intravenous contrast. RADIATION DOSE REDUCTION: This exam was performed according to the departmental dose-optimization program which includes automated exposure control,  adjustment of the mA and/or kV according to patient size and/or use of iterative reconstruction technique. COMPARISON:  No pertinent prior exams available for comparison. FINDINGS: Brain: Moderate generalized cerebral atrophy. Minimal ill-defined hypoattenuation within the periventricular cerebral white matter, nonspecific but compatible with chronic small vessel ischemic disease. Partially empty sella turcica. There is no acute intracranial hemorrhage. No demarcated cortical infarct. No extra-axial fluid collection. No evidence of an intracranial mass. No midline shift. Vascular: No hyperdense vessel. Atherosclerotic calcifications. Skull: No fracture or aggressive osseous lesion. Sinuses/Orbits: No mass or acute finding within the imaged orbits. Moderate-volume frothy secretions within the right sphenoid sinus. IMPRESSION: 1.  No evidence of an acute intracranial abnormality. 2. Minimal chronic small vessel ischemic changes within the cerebral white matter. 3. Moderate generalized cerebral atrophy. 4. Right sphenoid sinusitis. Electronically Signed   By: Jackey Loge D.O.   On: 10/13/2022 10:28    Pending Labs Unresulted Labs (From admission, onward)     Start     Ordered   10/14/22 0500  Renal function panel  Daily,   R      10/13/22 1256   10/14/22 0500  CBC  Daily,   R      10/13/22 1256   10/13/22 1600  Basic metabolic panel  Once,   R        10/13/22 1256   10/13/22 1251  Phosphorus  Add-on,   AD        10/13/22 1256            Vitals/Pain Today's Vitals   10/13/22 0918 10/13/22 0928 10/13/22 1200  BP: (!) 155/81  137/71  Pulse: 91  85  Resp: 18  18  Temp: 97.6 F (36.4 C)    TempSrc: Oral    SpO2: 96%  95%  Weight:  68 kg   PainSc:  0-No pain     Isolation Precautions No active isolations  Medications Medications  sodium chloride 0.9 % bolus 500 mL (0 mLs Intravenous Stopped 10/13/22 1120)    Followed by  0.9 %  sodium chloride infusion (100 mL/hr Intravenous New  Bag/Given 10/13/22 1056)  heparin injection 5,000 Units (has no administration in time range)  acetaminophen (TYLENOL) tablet 650 mg (has no administration in time range)    Or  acetaminophen (TYLENOL) suppository 650 mg (has no administration in time range)  ondansetron (ZOFRAN) tablet 4 mg (has no administration in time range)    Or  ondansetron (ZOFRAN) injection 4 mg (has no administration in time range)  sodium zirconium cyclosilicate (LOKELMA) packet 10 g (has no administration in time range)  calcium gluconate 1 g/ 50 mL sodium chloride IVPB (0 mg Intravenous Stopped 10/13/22 1131)  albuterol (PROVENTIL) (2.5 MG/3ML) 0.083% nebulizer solution 5 mg (5 mg Nebulization Given 10/13/22 1057)  insulin  aspart (novoLOG) injection 5 Units (5 Units Intravenous Given 10/13/22 1103)    And  dextrose 50 % solution 50 mL (50 mLs Intravenous Given 10/13/22 1057)    Mobility non-ambulatory     Focused Assessments Cardiac Assessment Handoff:    No results found for: "CKTOTAL", "CKMB", "CKMBINDEX", "TROPONINI" No results found for: "DDIMER" Does the Patient currently have chest pain? No    R Recommendations: See Admitting Provider Note  Report given to:   Additional Notes:

## 2022-10-13 NOTE — ED Provider Notes (Signed)
[place] EMERGENCY DEPARTMENT AT [facility] Provider Note   CSN: [id number] Arrival date & time: 10/13/22  4098     History  Chief Complaint  Patient presents with   Altered Mental Status   Weakness    Frederick Pham is a 72 y.o. male.   Altered Mental Status Associated symptoms: weakness   Weakness    Patient has history of CLL, osteoarthritis, delirium who presents to the ED for evaluation of altered mental status.  Wife states the patient has been diagnosed with Parkinson's.  Hoping to get a second opinion.  Wife states patient has had change in his mental status since April 11.  He had a fall where the patient did not think that he struck his head but the wife states ever since then he has not been acting right.  He will have episodes of confusion and delusions.  His sleep patterns have been off.  He has been seeing things that are not in the room.  He has been talking to people that are not there.  These are not constant and somewhat more transient.  He does appear to have more episodes of lucidity.  However, his symptoms are getting worse and he is gradually getting more confused.  Patient did have a video visit with his doctor on April 25 but the wife states he was more lucid at that time.  During the visit they mention he was having increasing difficulty moving.  A referral was placed to a neurologist.  Family also mentioned that he been having some trouble with constipation and recommendations were for enema and MiraLAX.  Wife states patient continues to not have normal bowel movements.  She has also noticed now that his abdomen is swollen.  He has not had any vomiting or diarrhea but has had decreased p.o. intake.  Home Medications Prior to Admission medications   Medication Sig Start Date End Date Taking? Authorizing Provider  acetaminophen (TYLENOL) 325 MG tablet Take 1-2 tablets (325-650 mg total) by mouth every 6 (six) hours as needed for mild pain (pain score  1-3 or temp > 100.5). 09/16/21   Edmisten, Kristie L, PA  carbidopa-levodopa (SINEMET IR) 25-100 MG tablet Take 1 tablet by mouth 3 (three) times daily. 11/16/21 11/11/22  Windell Norfolk, MD  IBUPROFEN PO Take 1 tablet by mouth as needed.    [provider]      Allergies    Morphine    Review of Systems   Review of Systems  Neurological:  Positive for weakness.    Physical Exam Updated Vital Signs BP 137/71   Pulse 85   Temp 97.6 F (36.4 C) (Oral)   Resp 18   Wt 68 kg   SpO2 95%   BMI 24.20 kg/m  Physical Exam Vitals and nursing note reviewed.  Constitutional:      Appearance: He is well-developed. He is ill-appearing. He is not diaphoretic.  HENT:     Head: Normocephalic and atraumatic.     Right Ear: External ear normal.     Left Ear: External ear normal.     Mouth/Throat:     Mouth: Mucous membranes are dry.  Eyes:     General: No scleral icterus.       Right eye: No discharge.        Left eye: No discharge.     Conjunctiva/sclera: Conjunctivae normal.  Neck:     Trachea: No tracheal deviation.  Cardiovascular:     Rate  and Rhythm: Normal rate and regular rhythm.  Pulmonary:     Effort: Pulmonary effort is normal. No respiratory distress.     Breath sounds: Normal breath sounds. No stridor. No wheezing or rales.  Abdominal:     General: Bowel sounds are normal. There is distension.     Palpations: Abdomen is soft.     Tenderness: There is no abdominal tenderness. There is no guarding or rebound.     Comments: Fullness distended suprapubic region, suspicious for urinary retention bladder distention  Musculoskeletal:        General: No tenderness or deformity.     Cervical back: Neck supple.  Skin:    General: Skin is warm and dry.     Findings: No rash.  Neurological:     General: No focal deficit present.     Mental Status: He is alert. He is disoriented.     GCS: GCS eye subscore is 4. GCS verbal subscore is 4. GCS motor subscore is 6.     Cranial  Nerves: No cranial nerve deficit, dysarthria or facial asymmetry.     Sensory: No sensory deficit.     Motor: No abnormal muscle tone or seizure activity.     Coordination: Coordination normal.     Comments: Flat affect, patient  Psychiatric:        Mood and Affect: Mood normal.     ED Results / Procedures / Treatments   Labs (all labs ordered are listed, but only abnormal results are displayed) Labs Reviewed  CBC - Abnormal; Notable for the following components:      Result Value   WBC 145.9 (*)    RDW 16.0 (*)    Platelets 137 (*)    All other components within normal limits  DIFFERENTIAL - Abnormal; Notable for the following components:   Lymphs Abs 141.8 (*)    Abs Immature Granulocytes 0.14 (*)    All other components within normal limits  COMPREHENSIVE METABOLIC PANEL - Abnormal; Notable for the following components:   Sodium 132 (*)    Potassium >7.5 (*)    CO2 11 (*)    Glucose, Bld 125 (*)    BUN 176 (*)    Creatinine, Ser 11.70 (*)    AST 10 (*)    Alkaline Phosphatase 183 (*)    Total Bilirubin 1.3 (*)    GFR, Estimated 4 (*)    Anion gap 21 (*)    All other components within normal limits  URINALYSIS, W/ REFLEX TO CULTURE (INFECTION SUSPECTED) - Abnormal; Notable for the following components:   Hgb urine dipstick SMALL (*)    All other components within normal limits  CBG MONITORING, ED - Abnormal; Notable for the following components:   Glucose-Capillary 123 (*)    All other components within normal limits  I-STAT CHEM 8, ED - Abnormal; Notable for the following components:   Sodium 129 (*)    Potassium 7.3 (*)    BUN >130 (*)    Creatinine, Ser 13.40 (*)    Glucose, Bld 118 (*)    Calcium, Ion 1.09 (*)    TCO2 13 (*)    All other components within normal limits  ETHANOL  RAPID URINE DRUG SCREEN, HOSP PERFORMED    EKG EKG Interpretation  Date/Time:  Friday Oct 13 2022 09:30:20 EDT Ventricular Rate:  85 PR Interval:  140 QRS Duration: 76 QT  Interval:  322 QTC Calculation: 383 R Axis:   35 Text Interpretation: Sinus rhythm  No significant change since last tracing Confirmed by Linwood Dibbles [phone]) on 10/13/2022 10:58:56 AM  Radiology CT HEAD WO CONTRAST  Result Date: 10/13/2022 CLINICAL DATA:  Provided history: Neuro deficit, acute, stroke suspected. Additional history provided: Altered mental status, decreased mobility, hallucinations. Fall on April 11th. EXAM: CT HEAD WITHOUT CONTRAST TECHNIQUE: Contiguous axial images were obtained from the base of the skull through the vertex without intravenous contrast. RADIATION DOSE REDUCTION: This exam was performed according to the departmental dose-optimization program which includes automated exposure control, adjustment of the mA and/or kV according to patient size and/or use of iterative reconstruction technique. COMPARISON:  No pertinent prior exams available for comparison. FINDINGS: Brain: Moderate generalized cerebral atrophy. Minimal ill-defined hypoattenuation within the periventricular cerebral white matter, nonspecific but compatible with chronic small vessel ischemic disease. Partially empty sella turcica. There is no acute intracranial hemorrhage. No demarcated cortical infarct. No extra-axial fluid collection. No evidence of an intracranial mass. No midline shift. Vascular: No hyperdense vessel. Atherosclerotic calcifications. Skull: No fracture or aggressive osseous lesion. Sinuses/Orbits: No mass or acute finding within the imaged orbits. Moderate-volume frothy secretions within the right sphenoid sinus. IMPRESSION: 1.  No evidence of an acute intracranial abnormality. 2. Minimal chronic small vessel ischemic changes within the cerebral white matter. 3. Moderate generalized cerebral atrophy. 4. Right sphenoid sinusitis. Electronically Signed   By: Jackey Loge D.O.   On: 10/13/2022 10:28    Procedures .Critical Care  Performed by: Linwood Dibbles, MD Authorized by: Linwood Dibbles, MD    Critical care provider statement:    Critical care time (minutes):  40   Critical care was time spent personally by me on the following activities:  Development of treatment plan with patient or surrogate, discussions with consultants, evaluation of patient's response to treatment, examination of patient, ordering and review of laboratory studies, ordering and review of radiographic studies, ordering and performing treatments and interventions, pulse oximetry, re-evaluation of patient's condition and review of old charts     Medications Ordered in ED Medications  sodium chloride 0.9 % bolus 500 mL (0 mLs Intravenous Stopped 10/13/22 1120)    Followed by  0.9 %  sodium chloride infusion (100 mL/hr Intravenous New Bag/Given 10/13/22 1056)  calcium gluconate 1 g/ 50 mL sodium chloride IVPB (0 mg Intravenous Stopped 10/13/22 1131)  albuterol (PROVENTIL) (2.5 MG/3ML) 0.083% nebulizer solution 5 mg (5 mg Nebulization Given 10/13/22 1057)  insulin aspart (novoLOG) injection 5 Units (5 Units Intravenous Given 10/13/22 1103)    And  dextrose 50 % solution 50 mL (50 mLs Intravenous Given 10/13/22 1057)    ED Course/ Medical Decision Making/ A&P Clinical Course as of 10/13/22 1249  Fri Oct 13, 2022  1055 Patient's i-STAT Chem-8 is notable for acute renal failure.  His bladder scan also shows greater than 1000 cc of urine.  Patient has acute renal failure from bladder outlet obstruction.  Foley catheter is ordered.  Treatment for hyperkalemia ordered.  I discussed the results with the patient and his wife [JK]  1057 CT HEAD WO CONTRAST Head CT without acute changes [JK]  1059 No EKG changes associated with hyperkalemia noted at this time.  Will need to monitor closely [JK]  1111 White blood cell count elevated.  Patient does have history of CLL, however this is increased compared to previous values [JK]  1131 Case discussed with Dr. Bettina Gavia.  He will see the patient in consultation [JK]  1230 Urinalysis not  suggestive of infection [JK]  1248 Case discussed  with Dr Robb Matar [JK]    Clinical Course User Index [JK] Linwood Dibbles, MD                             Medical Decision Making Problems Addressed: Acute renal failure, unspecified acute renal failure type [facility]): acute illness or injury that poses a threat to life or bodily functions CLL (chronic lymphocytic leukemia) (HCC): chronic illness or injury with exacerbation, progression, or side effects of treatment Urinary retention: acute illness or injury that poses a threat to life or bodily functions  Amount and/or Complexity of Data Reviewed Labs: ordered. Decision-making details documented in ED Course. Radiology: ordered and independent interpretation performed. Decision-making details documented in ED Course. Discussion of management or test interpretation with external provider(s): Nephrology, hospitalist service  Risk OTC drugs. Prescription drug management. Drug therapy requiring intensive monitoring for toxicity. Decision regarding hospitalization.   She presented to the ED for evaluation of altered mental status.  Symptoms seem to progress after a fall on April 11.  Patient's head CT did not show any acute abnormalities.  Family did mention some issues with constipation but they had not noticed that he had not been urinating abnormally.  On exam patient had a lower abdominal mass consistent with bladder distention.  Patient's bladder scan showed greater than 1000 cc.  A Foley catheter was placed with greater than 1000 cc of diuresis.  Patient's labs were notable for acute renal failure due to his urinary retention.  Patient also has been taking NSAIDs which could be contributing.  Patient was treated for his hyperkalemia with insulin glucose calcium.  Discussed case with Dr. Arlean Hopping nephrology who will evaluate the patient.  I will consult with the medical service for admission for further treatment        Final Clinical  Impression(s) / ED Diagnoses Final diagnoses:  Urinary retention  Acute renal failure, unspecified acute renal failure type (HCC)  CLL (chronic lymphocytic leukemia) (HCC)    Rx / DC Orders ED Discharge Orders     None         Linwood Dibbles, MD 10/13/22 1249

## 2022-10-13 NOTE — ED Notes (Signed)
Patient transported to CT 

## 2022-10-13 NOTE — Progress Notes (Deleted)
Notified MD Adhikari of hemoglobin increasing from 7.2 to 7.6 at 0953. No new orders at this time.  

## 2022-10-14 DIAGNOSIS — N179 Acute kidney failure, unspecified: Secondary | ICD-10-CM | POA: Diagnosis not present

## 2022-10-14 LAB — RENAL FUNCTION PANEL
Albumin: 3.1 g/dL — ABNORMAL LOW (ref 3.5–5.0)
Anion gap: 12 (ref 5–15)
BUN: 70 mg/dL — ABNORMAL HIGH (ref 8–23)
CO2: 20 mmol/L — ABNORMAL LOW (ref 22–32)
Calcium: 8.6 mg/dL — ABNORMAL LOW (ref 8.9–10.3)
Chloride: 112 mmol/L — ABNORMAL HIGH (ref 98–111)
Creatinine, Ser: 2.75 mg/dL — ABNORMAL HIGH (ref 0.61–1.24)
GFR, Estimated: 24 mL/min — ABNORMAL LOW (ref >60)
Glucose, Bld: 98 mg/dL (ref 70–99)
Phosphorus: 4.2 mg/dL (ref 2.5–4.6)
Potassium: 4.4 mmol/L (ref 3.5–5.1)
Sodium: 144 mmol/L (ref 135–145)

## 2022-10-14 LAB — CBC
HCT: 35.5 % — ABNORMAL LOW (ref 39.0–52.0)
Hemoglobin: 11.1 g/dL — ABNORMAL LOW (ref 13.0–17.0)
MCH: 32 pg (ref 26.0–34.0)
MCHC: 31.3 g/dL (ref 30.0–36.0)
MCV: 102.3 fL — ABNORMAL HIGH (ref 80.0–100.0)
Platelets: 91 10*3/uL — ABNORMAL LOW (ref 150–400)
RBC: 3.47 MIL/uL — ABNORMAL LOW (ref 4.22–5.81)
RDW: 15.1 % (ref 11.5–15.5)
WBC: 71.2 10*3/uL (ref 4.0–10.5)
nRBC: 0 % (ref 0.0–0.2)

## 2022-10-14 MED ORDER — DEXTROSE-NACL 5-0.2 % IV SOLN: INTRAVENOUS | Status: DC

## 2022-10-14 MED ORDER — HALOPERIDOL LACTATE 5 MG/ML IJ SOLN
2.5000 mg | INTRAMUSCULAR | Status: AC
  Administered 2022-10-14: 2.5 mg via INTRAMUSCULAR
  Filled 2022-10-14: qty 1

## 2022-10-14 MED ORDER — TAMSULOSIN HCL 0.4 MG PO CAPS
0.4000 mg | ORAL_CAPSULE | Freq: Every day | ORAL | Status: DC
  Administered 2022-10-14 – 2022-10-16 (×3): 0.4 mg via ORAL
  Filled 2022-10-14 (×3): qty 1

## 2022-10-14 MED ORDER — QUETIAPINE FUMARATE 25 MG PO TABS
25.0000 mg | ORAL_TABLET | Freq: Every day | ORAL | Status: DC
  Administered 2022-10-14: 25 mg via ORAL
  Filled 2022-10-14: qty 1

## 2022-10-14 MED ORDER — HALOPERIDOL LACTATE 5 MG/ML IJ SOLN: 2.5000 mg | Freq: Four times a day (QID) | INTRAMUSCULAR | Status: DC | PRN

## 2022-10-14 NOTE — Progress Notes (Signed)
[facility] Progress Note  Subjective: seen in room, looks much better, wide awake, in restraints.   Vitals:   10/14/22 0400 10/14/22 0417 10/14/22 0500 10/14/22 0600  BP: (!) 124/101  124/86 (!) 140/68  Pulse: 77 85 (!) 102 85  Resp: 18 14 14 14   Temp:  98.1 F (36.7 C)    TempSrc:  Axillary    SpO2: (!) 85% 92% 90% 93%  Weight:  66.3 kg    Height:        Exam: Gen older adult, awake and alert, knows this is 2024, did not know place but when told he was at [employer] he said this is [facility] No jvd or bruits Chest clear bilat to bases, no rales/ wheezing RRR no RG Abd soft ntnd no mass or ascites +bs GU foley cath draining clear urine MS no joint effusions or deformity Ext no LE or UE edema, no wounds or ulcers Neuro is as above, in restraints, moving all ext      Home meds include - sinemet IR, ibuprofen, prns     UA - neg protein, 6-10 rbc/ 0-5 wbc, no bact   Renal US - 10.1/10.5 cm kidneys w/o hydro   UNa 60   UCr  86       Assessment/ Plan: AKI - b/l creat 1.0 from 10/04/22. Creat here is 11 in the setting of bladder retention and dehydration. No hypotension. Exam looked dry. Foley cath placed w/ 1700 cc urine out instantly. No acei/ ARB, possible nsaids. UA neg and urine lytes nondescript. Renal US w/o obstruction. Severe AKI was due to bladder retention +/- dehydration. Excellent UOP w/ foley cath in place. Gave 2.5 L NS bolus yesterday. Creat was down to 7 yesterday afternoon, 4 last night and 2.5 this am. Would suspect a full recovery. Will change to hypotonic IVF"s w/ d5 1/4 NS at 75 cc/hr, and primary team can wean off the IVF's or stop when eating /drinking. No further suggestions, will sign off.  Metabolic acidosis - resolving.  Hyperkalemia - K+ > 7.5 on arrival. EKG showed no sig changes. K+ down to 4.7 today. Resolved.  Urinary retention - will need urology input for retention Parkinson's disease H/o CLL       Rob Arlean Hopping MD  [facility] 10/14/2022, 8:19 AM  Recent Labs  Lab 10/13/22 1027 10/13/22 1033 10/13/22 1542 10/13/22 2132 10/14/22 0305  HGB 13.0 13.6  --   --  11.1*  ALBUMIN 4.2  --   --   --  3.1*  CALCIUM 9.0  --    < > 8.7* 8.6*  PHOS 9.4*  --   --   --  4.2  CREATININE 11.70* 13.40*   < > 4.16* 2.75*  K >7.5* 7.3*   < > 4.7 4.4   < > = values in this interval not displayed.   No results for input(s): "IRON", "TIBC", "FERRITIN" in the last 168 hours. Inpatient medications:  carbidopa-levodopa  1 tablet Oral TID   Chlorhexidine Gluconate Cloth  6 each Topical Daily   heparin  5,000 Units Subcutaneous Q8H   mouth rinse  15 mL Mouth Rinse 4 times per day    sodium bicarbonate 150 mEq in sterile water 1,150 mL infusion 125 mL/hr at 10/14/22 0605   acetaminophen **OR** acetaminophen, ondansetron **OR** ondansetron (ZOFRAN) IV, mouth rinse, mouth rinse

## 2022-10-14 NOTE — Progress Notes (Signed)
    Patient Name: Frederick Pham           DOB: 1950-11-02  MRN: [id number]      Admission Date: 10/13/2022  Attending Provider: Bobette Mo, MD  Primary Diagnosis: Acute renal failure (ARF) [facility])   Level of care: Progressive    CROSS COVER NOTE   Date of Service   10/14/2022   Frederick Pham, 72 y.o. male, was admitted on 10/13/2022 for Acute renal failure (ARF) (HCC).    HPI/Events of Note   Notified by nursing staff that patient with altered mental status is agitated and restless.  Patient also becoming combative with nursing staff. Unfortunately, he is not following commands despite multiple attempts at redirection by staff.  Safety sitter recommended, however not available at this time.  Given his level of aggression, will trial IV Haldol. Due to patient's continued agitation and combativeness, he is placing himself and staff at risk.  For the safety of the patient, restraint has been placed. Restraint to be discontinued as soon as patient is able to safely have them removed.    Interventions/ Plan   Haldol Restraints        Anthoney Harada, DNP, ACNPC- AG [facility] [place]

## 2022-10-14 NOTE — Plan of Care (Signed)
  Problem: Education: Goal: Knowledge of General Education information will improve Description: Including pain rating scale, medication(s)/side effects and non-pharmacologic comfort measures 10/14/2022 0526 by Justus Memory, RN Outcome: Progressing 10/13/2022 2335 by Justus Memory, RN Outcome: Progressing   Problem: Health Behavior/Discharge Planning: Goal: Ability to manage health-related needs will improve Outcome: Progressing   Problem: Clinical Measurements: Goal: Ability to maintain clinical measurements within normal limits will improve Outcome: Progressing Goal: Will remain free from infection Outcome: Progressing Goal: Diagnostic test results will improve Outcome: Progressing Goal: Respiratory complications will improve Outcome: Progressing Goal: Cardiovascular complication will be avoided Outcome: Progressing   Problem: Activity: Goal: Risk for activity intolerance will decrease Outcome: Progressing   Problem: Nutrition: Goal: Adequate nutrition will be maintained Outcome: Progressing   Problem: Coping: Goal: Level of anxiety will decrease 10/14/2022 0526 by Justus Memory, RN Outcome: Progressing 10/13/2022 2335 by Justus Memory, RN Outcome: Progressing   Problem: Elimination: Goal: Will not experience complications related to bowel motility 10/14/2022 0526 by Justus Memory, RN Outcome: Progressing 10/13/2022 2335 by Justus Memory, RN Outcome: Progressing Goal: Will not experience complications related to urinary retention 10/14/2022 0526 by Justus Memory, RN Outcome: Progressing 10/13/2022 2335 by Justus Memory, RN Outcome: Progressing   Problem: Pain Managment: Goal: General experience of comfort will improve 10/14/2022 0526 by Justus Memory, RN Outcome: Progressing 10/13/2022 2335 by Justus Memory, RN Outcome: Progressing   Problem: Safety: Goal: Ability to remain free from injury will improve 10/14/2022 0526 by Justus Memory,  RN Outcome: Progressing 10/13/2022 2335 by Justus Memory, RN Outcome: Progressing   Problem: Skin Integrity: Goal: Risk for impaired skin integrity will decrease 10/14/2022 0526 by Justus Memory, RN Outcome: Progressing 10/13/2022 2335 by Justus Memory, RN Outcome: Progressing   Problem: Safety: Goal: Non-violent Restraint(s) Outcome: Progressing   Problem: Safety: Goal: Non-violent Restraint(s) Outcome: Progressing Patient being combative, hitting nursing staff, and pulling at lines and tubes, education provide for reasons for restraints and IM haldol.

## 2022-10-14 NOTE — Progress Notes (Signed)
PROGRESS NOTE  Frederick Pham  WJX:[id number] DOB: 1951-01-14 DOA: 10/13/2022 PCP: [name], NP   Brief Narrative: Patient is a 72 year old male with history of osteoarthritis, CLL, thrombocytopenia, basal cell carcinoma, left femoral neck fracture, parkinsonism who was brought to the emergency department with complaint of progressive altered mental status since last month after he had a fall.  Increased confusion since a week.  On presentation, he was hypertensive.  Lab work showed WBC count of 145.9, potassium of greater than 7.5, anion gap of 21, creatinine of 11.7.  CT head did not show any acute intracranial findings but showed moderate generalized cerebral atrophy.  Ultrasound of the kidney did not show  any hydronephrosis or nephrolithiasis.  Nephrology consulted.  Patient admitted for the management of severe AKI, encephalopathy.  Foley placed.  Significant improvement in the kidney function today.  Assessment & Plan:  Principal Problem:   Acute renal failure (ARF) (HCC) Active Problems:   Chronic lymphocytic leukemia, Rai stage I (HCC)   Thrombocytopenia (HCC)   AKI (acute kidney injury) (HCC)   Parkinsonism   Acute metabolic encephalopathy  AKI: Baseline creatinine normal.  Creatinine was 1 on 10/04/2022 as per [facility] lab report.  Presented with creatinine in the range of 11.  Suspected to be from obstructive uropathy.  Foley placed.  Nephrology was following.  Significant improvement in the kidney function today with creatinine in the range of 2. Patient needs to continue Foley on discharge and follow up with urology as an outpatient .  Started on Flomax. Continue gentle IV fluids for today  Hyperkalemia: Given Lokelma.  Resolved  Acute metabolic encephalopathy: History of parkinsonism but patient has been more confused since last month after he had a fall.  Confusion progressively worsened in a week.  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  CT head did not show any acute intracranial findings but showed minimal chronic small vessel ischemic changes, moderate generalized cerebral atrophy. Delirium precautions, frequent reorientation.  He is alert, awake, obeys commands and mostly oriented today  CLL: Has severe leukocytosis.  Follows with oncology as an outpatient.  We recommend follow-up with oncology as an outpatient  Thrombocytopenia: Continue to monitor platelet count.  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  On Sinemet  Frequent falls/deconditioning: Will consult PT and OT.  Wife not interested for SNF.  Patient being followed by PT at home      Pressure Injury 10/13/22 Toe (Comment  which one) Anterior;Left Deep Tissue Pressure Injury - Purple or maroon localized area of discolored intact skin or blood-filled blister due to damage of underlying soft tissue from pressure and/or shear. 0.25 x 0.2 (Active)  10/13/22 1355  Location: Toe (Comment  which one)  Location Orientation: Anterior;Left  Staging: Deep Tissue Pressure Injury - Purple or maroon localized area of discolored intact skin or blood-filled blister due to damage of underlying soft tissue from pressure and/or shear.  Wound Description (Comments): 0.25 x 0.25  Present on Admission: Yes  Dressing Type Foam - Lift dressing to assess site every shift 10/13/22 2001    DVT prophylaxis:heparin injection 5,000 Units Start: 10/13/22 2200     Code Status: Full Code  Family Communication: Wife at bedside  Patient status:Inpatient  Patient is from :home  Anticipated discharge to: Home  Estimated DC date: Tomorrow   Consultants: Nephrology  Procedures:None  Antimicrobials:  Anti-infectives (From  admission, onward)    None       Subjective: Patient seen and examined at bedside today.  Hemodynamically stable.  Looks very comfortable today.   Alert, awake and obeys commands and is not seen in the hospital.  Engages in conversation.  Not agitated.  Wife at bedside.  Denies any complaints  Objective: Vitals:   10/14/22 0400 10/14/22 0417 10/14/22 0500 10/14/22 0600  BP: (!) 124/101  124/86 (!) 140/68  Pulse: 77 85 (!) 102 85  Resp: 18 14 14 14   Temp:  98.1 F (36.7 C)    TempSrc:  Axillary    SpO2: (!) 85% 92% 90% 93%  Weight:  66.3 kg    Height:        Intake/Output Summary (Last 24 hours) at 10/14/2022 0726 Last data filed at 10/14/2022 0641 Gross per 24 hour  Intake 3684.47 ml  Output 9200 ml  Net -5515.53 ml   Filed Weights   10/13/22 0928 10/13/22 1400 10/14/22 0417  Weight: 68 kg 68.2 kg 66.3 kg    Examination:  General exam: Overall comfortable, not in distress HEENT: PERRL Respiratory system:  no wheezes or crackles  Cardiovascular system: S1 & S2 heard, RRR.  Gastrointestinal system: Abdomen is nondistended, soft and nontender. Central nervous system: Alert and oriented Extremities: No edema, no clubbing ,no cyanosis Skin: No rashes, no ulcers,no icterus   GU: Foley   Data Reviewed: I have personally reviewed following labs and imaging studies  CBC: Recent Labs  Lab 10/13/22 1027 10/13/22 1033 10/14/22 0305  WBC 145.9*  --  71.2*  NEUTROABS 3.7  --   --   HGB 13.0 13.6 11.1*  HCT 41.3 40.0 35.5*  MCV 96.3  --  102.3*  PLT 137*  --  91*   Basic Metabolic Panel: Recent Labs  Lab 10/13/22 1027 10/13/22 1033 10/13/22 1542 10/13/22 2132 10/14/22 0305  NA 132* 129* 135 138 144  K >7.5* 7.3* 5.6* 4.7 4.4  CL 100 107 107 110 112*  CO2 11*  --  13* 18* 20*  GLUCOSE 125* 118* 117* 116* 98  BUN 176* >130* 140* 95* 70*  CREATININE 11.70* 13.40* 7.61* 4.16* 2.75*  CALCIUM 9.0  --  8.3* 8.7* 8.6*  PHOS 9.4*  --   --   --  4.2     Recent Results (from the past 240 hour(s))  MRSA Next Gen by PCR, Nasal     Status: None   Collection Time: 10/13/22  2:35 PM   Specimen: Nasal Mucosa; Nasal  Swab  Result Value Ref Range Status   MRSA by PCR Next Gen NOT DETECTED NOT DETECTED Final    Comment: (NOTE) The GeneXpert MRSA Assay (FDA approved for NASAL specimens only), is one component of a comprehensive MRSA colonization surveillance program. It is not intended to diagnose MRSA infection nor to guide or monitor treatment for MRSA infections. Test performance is not FDA approved in patients less than 98 years old. Performed at [facility], [address] [place], [place] [address]      Radiology Studies: US RENAL  Result Date: 10/13/2022 CLINICAL DATA:  ACA EXAM: RENAL / URINARY TRACT ULTRASOUND COMPLETE COMPARISON:  None Available. FINDINGS: Right Kidney: Renal measurements: 10.1 x 4.9 x 5.5 cm = volume: 142 mL. Echogenicity within normal limits. No mass or hydronephrosis visualized. Left Kidney: Left kidney is poorly visualized. Renal measurements: 10.5 x 5.8 x 5.8 cm = volume: 188 mL. Echogenicity within normal limits. No mass  or hydronephrosis visualized. Bladder: Urinary bladder is decompressed with a Foley catheter in place. Other: Incidentally noted is a small left-sided pleural effusion. IMPRESSION: No hydronephrosis or nephrolithiasis. Electronically Signed   By: Lorenza Cambridge M.D.   On: 10/13/2022 15:44   CT HEAD WO CONTRAST  Result Date: 10/13/2022 CLINICAL DATA:  Provided history: Neuro deficit, acute, stroke suspected. Additional history provided: Altered mental status, decreased mobility, hallucinations. Fall on April 11th. EXAM: CT HEAD WITHOUT CONTRAST TECHNIQUE: Contiguous axial images were obtained from the base of the skull through the vertex without intravenous contrast. RADIATION DOSE REDUCTION: This exam was performed according to the departmental dose-optimization program which includes automated exposure control, adjustment of the mA and/or kV according to patient size and/or use of iterative reconstruction technique. COMPARISON:  No pertinent  prior exams available for comparison. FINDINGS: Brain: Moderate generalized cerebral atrophy. Minimal ill-defined hypoattenuation within the periventricular cerebral white matter, nonspecific but compatible with chronic small vessel ischemic disease. Partially empty sella turcica. There is no acute intracranial hemorrhage. No demarcated cortical infarct. No extra-axial fluid collection. No evidence of an intracranial mass. No midline shift. Vascular: No hyperdense vessel. Atherosclerotic calcifications. Skull: No fracture or aggressive osseous lesion. Sinuses/Orbits: No mass or acute finding within the imaged orbits. Moderate-volume frothy secretions within the right sphenoid sinus. IMPRESSION: 1.  No evidence of an acute intracranial abnormality. 2. Minimal chronic small vessel ischemic changes within the cerebral white matter. 3. Moderate generalized cerebral atrophy. 4. Right sphenoid sinusitis. Electronically Signed   By: Jackey Loge D.O.   On: 10/13/2022 10:28    Scheduled Meds:  carbidopa-levodopa  1 tablet Oral TID   Chlorhexidine Gluconate Cloth  6 each Topical Daily   heparin  5,000 Units Subcutaneous Q8H   mouth rinse  15 mL Mouth Rinse 4 times per day   Continuous Infusions:  sodium bicarbonate 150 mEq in sterile water 1,150 mL infusion 125 mL/hr at 10/14/22 0605     LOS: 1 day   Burnadette Pop, MD [facility] P5/09/2022, 7:26 AM

## 2022-10-15 ENCOUNTER — Inpatient Hospital Stay (HOSPITAL_COMMUNITY): Payer: Medicare Other

## 2022-10-15 DIAGNOSIS — N179 Acute kidney failure, unspecified: Secondary | ICD-10-CM | POA: Diagnosis not present

## 2022-10-15 LAB — RENAL FUNCTION PANEL
Albumin: 2.5 g/dL — ABNORMAL LOW (ref 3.5–5.0)
Anion gap: 7 (ref 5–15)
BUN: 23 mg/dL (ref 8–23)
CO2: 25 mmol/L (ref 22–32)
Calcium: 8.4 mg/dL — ABNORMAL LOW (ref 8.9–10.3)
Chloride: 107 mmol/L (ref 98–111)
Creatinine, Ser: 0.89 mg/dL (ref 0.61–1.24)
GFR, Estimated: >60 mL/min (ref >60)
Glucose, Bld: 130 mg/dL — ABNORMAL HIGH (ref 70–99)
Phosphorus: 2.1 mg/dL — ABNORMAL LOW (ref 2.5–4.6)
Potassium: 4 mmol/L (ref 3.5–5.1)
Sodium: 139 mmol/L (ref 135–145)

## 2022-10-15 LAB — CBC
HCT: 29.5 % — ABNORMAL LOW (ref 39.0–52.0)
Hemoglobin: 9.7 g/dL — ABNORMAL LOW (ref 13.0–17.0)
MCH: 32.3 pg (ref 26.0–34.0)
MCHC: 32.9 g/dL (ref 30.0–36.0)
MCV: 98.3 fL (ref 80.0–100.0)
Platelets: 85 10*3/uL — ABNORMAL LOW (ref 150–400)
RBC: 3 MIL/uL — ABNORMAL LOW (ref 4.22–5.81)
RDW: 14.7 % (ref 11.5–15.5)
WBC: 55.5 10*3/uL (ref 4.0–10.5)
nRBC: 0 % (ref 0.0–0.2)

## 2022-10-15 MED ORDER — K PHOS MONO-SOD PHOS DI & MONO 155-852-130 MG PO TABS
500.0000 mg | ORAL_TABLET | Freq: Three times a day (TID) | ORAL | Status: DC
  Administered 2022-10-15 (×3): 500 mg via ORAL
  Filled 2022-10-15 (×4): qty 2

## 2022-10-15 NOTE — Progress Notes (Signed)
PT Cancellation Note  Patient Details Name: Frederick Pham MRN: [id number] DOB: 05/23/1951   Cancelled Treatment:    Reason Eval/Treat Not Completed:  Attempted PT eval-unable to awaken pt-pt received haldol early this am. Will check back as schedule allows.    Faye Ramsay, PT Acute Rehabilitation  Office: [phone]

## 2022-10-15 NOTE — Progress Notes (Addendum)
PROGRESS NOTE  Frederick Pham  ZOX:[id number] DOB: 03/29/1951 DOA: 10/13/2022 PCP: Frederick Heir, NP   Brief Narrative: Patient is a 72 year old male with history of osteoarthritis, CLL, thrombocytopenia, basal cell carcinoma, left femoral neck fracture, parkinsonism who was brought to the emergency department with complaint of progressive altered mental status since last month after he had a fall.  Increased confusion since a week.  On presentation, he was hypertensive.  Lab work showed WBC count of 145.9, potassium of greater than 7.5, anion gap of 21, creatinine of 11.7.  CT head did not show any acute intracranial findings but showed moderate generalized cerebral atrophy.  Ultrasound of the kidney did not show  any hydronephrosis or nephrolithiasis.  Nephrology consulted.  Patient admitted for the management of severe AKI, encephalopathy.  Foley placed. Kidney function has normalized.  Hospital course remarkable for intermittent agitation.  Assessment & Plan:  Principal Problem:   Acute renal failure (ARF) (HCC) Active Problems:   Chronic lymphocytic leukemia, Rai stage I (HCC)   Thrombocytopenia (HCC)   AKI (acute kidney injury) (HCC)   Parkinsonism   Acute metabolic encephalopathy  AKI: Baseline creatinine normal.  Creatinine was 1 on 10/04/2022 as per [facility] healthcare lab report.  Presented with creatinine in the range of 11.  Suspected to be from obstructive uropathy left neurogenic bladder not excluded.  Foley placed.  Nephrology was following.  Significant improvement in the kidney function today , creatinine in the normal range Patient needs to continue Foley on discharge and follow up with urology as an outpatient .  Started on Flomax.   Fall/Back pain: At baseline, patient ambulates well though he had increased risk for fall.  After a fall about a month ago, his mobility significantly declined.  PT/OT consulted.  Checking lumbar spine x-ray.  Depending upon the PT evaluation, we might  consider MRI of the spine.  Need to rule out neurogenic bladder/urinary retention secondary to back trauma.Wife not interested for SNF.  Patient being followed by PT at home  Hyperkalemia: Given Lokelma.  Resolved  Acute metabolic encephalopathy: History of parkinsonism but patient has been more confused since last month after he had a fall.  Confusion progressively worsened in a week.  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  CT head did not show any acute intracranial findings but showed minimal chronic small vessel ischemic changes, moderate generalized cerebral atrophy. Delirium precautions, frequent reorientation.  He was  alert, awake, obeys commands and mostly oriented on 5/4, became confused again later during the day.  Was started on Seroquel for night.  This morning he is sleepy.  Seroquel/Haldol discontinued  CLL: Has severe leukocytosis.  Follows with oncology as an outpatient.  We recommend follow-up with oncology as an outpatient  Hypophosphatemia: Supplemented with phosphorous  Thrombocytopenia: Continue to monitor platelet count.  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  On Sinemet       Pressure Injury 10/13/22 Toe (Comment  which one) Anterior;Left Deep Tissue Pressure Injury - Purple or maroon localized area of discolored intact skin or blood-filled blister due to damage of underlying soft tissue from pressure and/or shear. 0.25 x 0.2 (Active)  10/13/22 1355  Location: Toe (Comment  which one)  Location Orientation: Anterior;Left  Staging: Deep Tissue Pressure Injury - Purple or maroon localized area of discolored intact skin or blood-filled blister due to damage of underlying soft tissue from  pressure and/or shear.  Wound Description (Comments): 0.25 x 0.25  Present on Admission: Yes  Dressing Type Foam - Lift dressing to assess site every shift  10/14/22 0800    DVT prophylaxis:heparin injection 5,000 Units Start: 10/13/22 2200     Code Status: Full Code  Family Communication: Wife at bedside  Patient status:Inpatient  Patient is from :home  Anticipated discharge to: Home  Estimated DC date: 1-2 days   Consultants: Nephrology  Procedures:None  Antimicrobials:  Anti-infectives (From admission, onward)    None       Subjective: Patient seen and examined at bedside today.  Hemodynamically stable, lying in bed.  He has been sleepy since this morning.  Did not wake up on calling his name. Had a long discussion with wife at bedside today.  Objective: Vitals:   10/14/22 1800 10/14/22 2055 10/15/22 0057 10/15/22 0613  BP: (!) 127/57 (!) 101/54 (!) 129/59 122/60  Pulse: 91 82 86 76  Resp: 20 20 20 18   Temp:  98.2 F (36.8 C) 98.8 F (37.1 C) 98.7 F (37.1 C)  TempSrc:  Oral Oral Oral  SpO2: (!) 89% 92% 94% 94%  Weight:    69.3 kg  Height:        Intake/Output Summary (Last 24 hours) at 10/15/2022 1144 Last data filed at 10/15/2022 1057 Gross per 24 hour  Intake 1692.18 ml  Output 2200 ml  Net -507.82 ml   Filed Weights   10/13/22 1400 10/14/22 0417 10/15/22 0613  Weight: 68.2 kg 66.3 kg 69.3 kg    Examination:  General exam: sleepy Respiratory system:  no wheezes or crackles  Cardiovascular system: S1 & S2 heard, RRR.  Gastrointestinal system: Abdomen is nondistended, soft and nontender. Central nervous system: sleepy Extremities: No edema, no clubbing ,no cyanosis Skin: No rashes, no ulcers,no icterus   GU: Foley   Data Reviewed: I have personally reviewed following labs and imaging studies  CBC: Recent Labs  Lab 10/13/22 1027 10/13/22 1033 10/14/22 0305 10/15/22 0417  WBC 145.9*  --  71.2* 55.5*  NEUTROABS 3.7  --   --   --   HGB 13.0 13.6 11.1* 9.7*  HCT 41.3 40.0 35.5* 29.5*  MCV 96.3  --  102.3* 98.3  PLT 137*  --  91* 85*   Basic Metabolic Panel: Recent Labs  Lab  10/13/22 1027 10/13/22 1033 10/13/22 1542 10/13/22 2132 10/14/22 0305 10/15/22 0417  NA 132* 129* 135 138 144 139  K >7.5* 7.3* 5.6* 4.7 4.4 4.0  CL 100 107 107 110 112* 107  CO2 11*  --  13* 18* 20* 25  GLUCOSE 125* 118* 117* 116* 98 130*  BUN 176* >130* 140* 95* 70* 23  CREATININE 11.70* 13.40* 7.61* 4.16* 2.75* 0.89  CALCIUM 9.0  --  8.3* 8.7* 8.6* 8.4*  PHOS 9.4*  --   --   --  4.2 2.1*     Recent Results (from the past 240 hour(s))  MRSA Next Gen by PCR, Nasal     Status: None   Collection Time: 10/13/22  2:35 PM   Specimen: Nasal Mucosa; Nasal Swab  Result Value Ref Range Status   MRSA by PCR Next Gen NOT DETECTED NOT DETECTED Final    Comment: (NOTE) The GeneXpert MRSA Assay (FDA approved for NASAL specimens only), is one component of a comprehensive MRSA colonization surveillance program. It is not intended to diagnose MRSA infection nor to guide or monitor treatment for MRSA infections. Test performance is not  FDA approved in patients less than 30 years old. Performed at [facility], [address] [place], [place] [address]      Radiology Studies: US RENAL  Result Date: 10/13/2022 CLINICAL DATA:  ACA EXAM: RENAL / URINARY TRACT ULTRASOUND COMPLETE COMPARISON:  None Available. FINDINGS: Right Kidney: Renal measurements: 10.1 x 4.9 x 5.5 cm = volume: 142 mL. Echogenicity within normal limits. No mass or hydronephrosis visualized. Left Kidney: Left kidney is poorly visualized. Renal measurements: 10.5 x 5.8 x 5.8 cm = volume: 188 mL. Echogenicity within normal limits. No mass or hydronephrosis visualized. Bladder: Urinary bladder is decompressed with a Foley catheter in place. Other: Incidentally noted is a small left-sided pleural effusion. IMPRESSION: No hydronephrosis or nephrolithiasis. Electronically Signed   By: Lorenza Cambridge M.D.   On: 10/13/2022 15:44    Scheduled Meds:  carbidopa-levodopa  1 tablet Oral TID   Chlorhexidine Gluconate Cloth   6 each Topical Daily   heparin  5,000 Units Subcutaneous Q8H   mouth rinse  15 mL Mouth Rinse 4 times per day   phosphorus  500 mg Oral TID   tamsulosin  0.4 mg Oral Daily   Continuous Infusions:     LOS: 2 days   Burnadette Pop, MD [facility] P5/10/2022, 11:44 AM

## 2022-10-15 NOTE — Progress Notes (Signed)
OT Cancellation Note  Patient Details Name: LEIB ASSELTA MRN: [id number] DOB: [birth year]   Cancelled Treatment:    Reason Eval/Treat Not Completed: Other (comment). Pt given Haldol earlier and sleeping very soundly.  Lindon Romp OT Acute Rehabilitation Services Office [phone]    Evette Georges 10/15/2022, 11:19 AM

## 2022-10-15 NOTE — Evaluation (Signed)
Physical Therapy Evaluation Patient Details Name: Frederick Pham MRN: [id number] DOB: 1950/09/30 Today's Date: 10/15/2022  History of Present Illness  72 yo male admitted with acute renal failure, acute met encephalopathy. Hx of OA, CLL, Parkinson's, fall, L THA DA 2023, basal cell ca  Clinical Impression  On eval, pt required Mod-Max A +2 for safe mobility. He was able to walk ~7 feet with a RW. He is a high fall risk. He follows 1 step commands inconsistently. Session limited by bowel incontinence and pt having to go down for an x ray (2nd attempt to work with pt on today). Extensive discussion with pt's wife about d/c plan. She is adamant about pt returning home-she refuses placement in SNF for rehab (prior bad experience and she feels he will be better suited at home). I highly recommend +2 assist for safe mobility at this time. She reported she is making arrangements for in home care. Highly recommend 24/7 supervision/assist. Will plan to follow pt during this hospital stay. Pt will benefit from continued rehab after discharge.     Recommendations for follow up therapy are one component of a multi-disciplinary discharge planning process, led by the attending physician.  Recommendations may be updated based on patient status, additional functional criteria and insurance authorization.  Follow Up Recommendations       Assistance Recommended at Discharge Frequent or constant Supervision/Assistance  Patient can return home with the following  Assistance with cooking/housework;Direct supervision/assist for financial management;Help with stairs or ramp for entrance;Assist for transportation;Two people to help with walking and/or transfers;Two people to help with bathing/dressing/bathroom    Equipment Recommendations None recommended by PT  Recommendations for Other Services       Functional Status Assessment Patient has had a recent decline in their functional status and demonstrates the  ability to make significant improvements in function in a reasonable and predictable amount of time.     Precautions / Restrictions Precautions Precautions: Fall Restrictions Weight Bearing Restrictions: No      Mobility  Bed Mobility Overal bed mobility: Needs Assistance Bed Mobility: Supine to Sit     Supine to sit: Mod assist, +2 for physical assistance, +2 for safety/equipment, HOB elevated     General bed mobility comments: Assist for trunk and bil LEs. Utilized bedpad for scooting, positioning. Increased time. Multimodal cueing required.    Transfers Overall transfer level: Needs assistance Equipment used: Rolling walker (2 wheels) Transfers: Sit to/from Stand, Bed to chair/wheelchair/BSC Sit to Stand: +2 physical assistance, +2 safety/equipment, From elevated surface, Mod assist   Step pivot transfers: Max assist, +2 physical assistance, +2 safety/equipment       General transfer comment: Posteior bias. Assist to power up, stabilize, control descent. Multimodal cueing required. Performed step pivot poorly. High fall risk.    Ambulation/Gait Ambulation/Gait assistance: Mod assist, +2 physical assistance, +2 safety/equipment Gait Distance (Feet): 7 Feet Assistive device: Rolling walker (2 wheels) Gait Pattern/deviations: Decreased stride length, Decreased step length - right, Decreased step length - left, Shuffle       General Gait Details: Assist to stabilize pt and manage RW and follow with recliner. After walking a few feet, noticed bowel incontinence. Deferred further ambulation and assisted pt back to the room in a recliner.  Stairs            Wheelchair Mobility    Modified Rankin (Stroke Patients Only)       Balance Overall balance assessment: Needs assistance Sitting-balance support: Feet supported, Bilateral upper extremity supported  Sitting balance-Leahy Scale: Fair     Standing balance support: Reliant on assistive device for balance,  During functional activity, Bilateral upper extremity supported Standing balance-Leahy Scale: Poor                               Pertinent Vitals/Pain Pain Assessment Pain Assessment: No/denies pain    Home Living                          Prior Function                       Hand Dominance        Extremity/Trunk Assessment   Upper Extremity Assessment Upper Extremity Assessment: Defer to OT evaluation    Lower Extremity Assessment Lower Extremity Assessment: Generalized weakness    Cervical / Trunk Assessment Cervical / Trunk Assessment: Kyphotic  Communication      Cognition Arousal/Alertness: Awake/alert Behavior During Therapy: WFL for tasks assessed/performed   Area of Impairment: Orientation, Following commands, Safety/judgement, Problem solving                 Orientation Level: Disoriented to, Situation, Time     Following Commands: Follows one step commands inconsistently Safety/Judgement: Decreased awareness of safety, Decreased awareness of deficits   Problem Solving: Requires tactile cues, Requires verbal cues, Difficulty sequencing, Decreased initiation, Slow processing          General Comments      Exercises     Assessment/Plan    PT Assessment Patient needs continued PT services  PT Problem List Decreased strength;Decreased range of motion;Decreased activity tolerance;Decreased balance;Decreased mobility;Decreased knowledge of use of DME;Decreased safety awareness;Decreased cognition       PT Treatment Interventions DME instruction;Gait training;Therapeutic exercise;Balance training;Functional mobility training;Therapeutic activities;Patient/family education    PT Goals (Current goals can be found in the Care Plan section)  Acute Rehab PT Goals Patient Stated Goal: wife wants home PT Goal Formulation: With family Time For Goal Achievement: 10/29/22 Potential to Achieve Goals: Good    Frequency  Min 1X/week     Co-evaluation               AM-PAC PT "6 Clicks" Mobility  Outcome Measure Help needed turning from your back to your side while in a flat bed without using bedrails?: A Lot Help needed moving from lying on your back to sitting on the side of a flat bed without using bedrails?: A Lot Help needed moving to and from a bed to a chair (including a wheelchair)?: A Lot Help needed standing up from a chair using your arms (e.g., wheelchair or bedside chair)?: A Lot Help needed to walk in hospital room?: A Lot Help needed climbing 3-5 steps with a railing? : Total 6 Click Score: 11    End of Session Equipment Utilized During Treatment: Gait belt Activity Tolerance: Patient tolerated treatment well Patient left: in bed;with call bell/phone within reach;with nursing/sitter in room (NT finished up with cleaning pt and preparing to take hm for x ray)   PT Visit Diagnosis: History of falling (Z91.81);Muscle weakness (generalized) (M62.81);Difficulty in walking, not elsewhere classified (R26.2)    Time: 1610-9604 PT Time Calculation (min) (ACUTE ONLY): 21 min   Charges:   PT Evaluation $PT Eval Low Complexity: 1 Low             Zoii Florer P, PT Acute Rehabilitation  Office: [phone] '

## 2022-10-15 NOTE — Progress Notes (Signed)
Writer in room to administer 10 am medications and to perform shift assessment. Patient is very drowsy - opens eyes for a few seconds then drifts back to sleep. Haldol does not appear to been given - notifies Dr. Renford Dills. PT attempted to evaluate patient, however will not stay awake along enough for evaluation. Will reattempt at a later time.

## 2022-10-16 ENCOUNTER — Inpatient Hospital Stay (HOSPITAL_COMMUNITY): Payer: Medicare Other

## 2022-10-16 DIAGNOSIS — N179 Acute kidney failure, unspecified: Secondary | ICD-10-CM | POA: Diagnosis not present

## 2022-10-16 LAB — PHOSPHORUS: Phosphorus: 3.5 mg/dL (ref 2.5–4.6)

## 2022-10-16 LAB — CBC
HCT: 31 % — ABNORMAL LOW (ref 39.0–52.0)
Hemoglobin: 10 g/dL — ABNORMAL LOW (ref 13.0–17.0)
MCH: 31.6 pg (ref 26.0–34.0)
MCHC: 32.3 g/dL (ref 30.0–36.0)
MCV: 98.1 fL (ref 80.0–100.0)
Platelets: 89 10*3/uL — ABNORMAL LOW (ref 150–400)
RBC: 3.16 MIL/uL — ABNORMAL LOW (ref 4.22–5.81)
RDW: 14.3 % (ref 11.5–15.5)
WBC: 66.5 10*3/uL (ref 4.0–10.5)
nRBC: 0 % (ref 0.0–0.2)

## 2022-10-16 LAB — RENAL FUNCTION PANEL
Albumin: 2.6 g/dL — ABNORMAL LOW (ref 3.5–5.0)
Anion gap: 8 (ref 5–15)
BUN: 19 mg/dL (ref 8–23)
CO2: 26 mmol/L (ref 22–32)
Calcium: 8 mg/dL — ABNORMAL LOW (ref 8.9–10.3)
Chloride: 105 mmol/L (ref 98–111)
Creatinine, Ser: 1 mg/dL (ref 0.61–1.24)
GFR, Estimated: >60 mL/min (ref >60)
Glucose, Bld: 117 mg/dL — ABNORMAL HIGH (ref 70–99)
Phosphorus: 3.5 mg/dL (ref 2.5–4.6)
Potassium: 3.9 mmol/L (ref 3.5–5.1)
Sodium: 139 mmol/L (ref 135–145)

## 2022-10-16 MED ORDER — TAMSULOSIN HCL 0.4 MG PO CAPS: 0.4000 mg | ORAL_CAPSULE | Freq: Every day | ORAL | 1 refills | Status: DC

## 2022-10-16 MED ORDER — ORAL CARE MOUTH RINSE: 15.0000 mL | OROMUCOSAL | Status: DC | PRN

## 2022-10-16 NOTE — Discharge Summary (Signed)
Physician Discharge Summary  Frederick Pham:[id number] DOB: 1950-06-13 DOA: 10/13/2022  PCP: [name], NP  Admit date: 10/13/2022 Discharge date: 10/16/2022  Admitted From: Home Disposition:  Home  Discharge Condition:Stable CODE STATUS:FULL Diet recommendation: Regular   Brief/Interim Summary: Patient is a 72 year old male with history of osteoarthritis, CLL, thrombocytopenia, basal cell carcinoma, left femoral neck fracture, parkinsonism who was brought to the emergency department with complaint of progressive altered mental status since last month after he had a fall.  Increased confusion since a week.  On presentation, he was hypertensive.  Lab work showed WBC count of 145.9, potassium of greater than 7.5, anion gap of 21, creatinine of 11.7.  CT head did not show any acute intracranial findings but showed moderate generalized cerebral atrophy.  Ultrasound of the kidney did not show  any hydronephrosis or nephrolithiasis.  Nephrology consulted.  Patient admitted for the management of severe AKI, encephalopathy.  Foley placed. Kidney function has normalized.  PT recommended SNF but family wants to take him home .  Medically stable for discharge.  Patient needs to follow-up with urology as an outpatient.  He will be discharged with Foley catheter.MRI pelvis showed sacral insufficiency fractures,orthopedics recommended outpatient follow up.   Following problems were addressed during the hospitalization:   AKI: Baseline creatinine normal.  Creatinine was 1 on 10/04/2022 as per [facility] lab report.  Presented with creatinine in the range of 11.  Suspected to be from obstructive uropathy left neurogenic bladder not excluded.  Foley placed.  Nephrology was following. Creatinine in the normal range Patient needs to continue Foley on discharge and follow up with urology as an outpatient .  Started on Flomax.   Fall/Back pain: At baseline, patient ambulates well though he had increased risk  for fall.  After a fall about a month ago, his mobility significantly declined.  PT/OT consulted. Patient being followed by PT at home.  PT recommended SNF but wife wants to take him home and resume home health.  No significant back pain today.  Lumbar spine x-ray showed degenerative changes.  Lumbar spine MRI showed  disc bulge at L2-L3 and mild bilateral facet arthropathy resulting in mild-to-moderate spinal canal stenosis with right subarticular zone effacement with suspected impingement of the traversing right L3 nerve root.Also shwed edema in the bilateral sacral ala with presacral edema.Pelvic MRI showed bilateral sacral insufficiency fractures.case discussed with Ortho,outpatient follow up recommended  Hyperkalemia: Given Lokelma.  Resolved   Acute metabolic encephalopathy: History of parkinsonism but patient has been more confused since last month after he had a fall.  Confusion progressively worsened in a week.  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  CT head did not show any acute intracranial findings but showed minimal chronic small vessel ischemic changes, moderate generalized cerebral atrophy.  This morning he remains alert and awake and coherent.  CLL: Has severe leukocytosis.  Follows with oncology as an outpatient.  We recommend follow-up with oncology as an outpatient   Hypophosphatemia: Supplemented with phosphorous   Thrombocytopenia: Continue to monitor platelet count.  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  On Sinemet   Discharge Diagnoses:  Principal Problem:   Acute renal failure (ARF) (HCC) Active Problems:   Chronic lymphocytic leukemia, Rai stage I (HCC)   Thrombocytopenia (HCC)   AKI (acute kidney injury) (HCC)   Parkinsonism   Acute metabolic encephalopathy  Discharge Instructions  Discharge Instructions     Diet general    Complete by: As directed    Discharge instructions   Complete by: As directed    1)Please take prescribed medication as instructed 2)Follow up with urology as an outpatient in a week. Name and number of the provider has been attached 3)Follow up with orthopedics in a week.Name and number of the provider has been attached 4)Follow up with your PCP in a week 5)Follow up with physical therapy   Increase activity slowly   Complete by: As directed    No wound care   Complete by: As directed       Allergies as of 10/16/2022       Reactions   Morphine Other (See Comments)   " Caused extreme confusion and combativeness "         Medication List     TAKE these medications    acetaminophen 325 MG tablet Commonly known as: TYLENOL Take 1-2 tablets (325-650 mg total) by mouth every 6 (six) hours as needed for mild pain (pain score 1-3 or temp > 100.5).   carbidopa-levodopa 25-100 MG tablet Commonly known as: SINEMET IR Take 1 tablet by mouth 3 (three) times daily.   ibuprofen 200 MG tablet Commonly known as: ADVIL Take 200 mg by mouth every 6 (six) hours as needed for mild pain.   tamsulosin 0.4 MG Caps capsule Commonly known as: FLOMAX Take 1 capsule (0.4 mg total) by mouth daily. Start taking on: Oct 17, 2022        Follow-up Information     [facility]. Schedule an appointment as soon as possible for a visit in 1 week(s).   Contact information: [address] [place] [address] [phone]        [name], NP. Schedule an appointment as soon as possible for a visit in 1 week(s).   Specialty: Adult Health Nurse Practitioner Contact information: [address] [place] [address] [phone]         Teryl Lucy, MD. Schedule an appointment as soon as possible for a visit in 1 week(s).   Specialty: Orthopedic Surgery Contact information: [address] [place] [address] [phone]                 Allergies  Allergen Reactions   Morphine Other (See Comments)    " Caused extreme confusion and combativeness "     Consultations: None   Procedures/Studies: MR LUMBAR SPINE WO CONTRAST  Result Date: 10/16/2022 CLINICAL DATA:  Fall on 09/21/2022.  Bowel incontinence. EXAM: MRI LUMBAR SPINE WITHOUT CONTRAST TECHNIQUE: Multiplanar, multisequence MR imaging of the lumbar spine was performed. No intravenous contrast was administered. COMPARISON:  Lumbar spine radiographs 1 day prior, CT abdomen/pelvis 02/18/2021 FINDINGS: Segmentation: Standard; the lowest formed disc space is designated L5-S1. Alignment: There is S shaped curvature with dextrocurvature centered at L1-L2 and levocurvature centered at L4-L5. There is trace retrolisthesis of L2 on L3, likely degenerative in nature. Vertebrae: Vertebral body heights are preserved. Background marrow signal is normal. There is no suspicious marrow signal abnormality or marrow edema in the lumbar spine. There is edema in the bilateral sacral ala, incompletely imaged (6-16, 6-4) with presacral edema. Conus medullaris and cauda equina: Conus extends to the L1 level. Conus and cauda equina appear normal. Paraspinal and other soft tissues: The paraspinal soft tissues are unremarkable. There is a 1.0 cm T2 hypointense lesion in  the left kidney which in retrospect was likely present on the CT from 02/18/2021, likely reflecting a hemorrhagic or proteinaceous cyst requiring no specific imaging follow-up a left adrenal nodule is unchanged compared to the CT abdomen/pelvis from 2022. Disc levels: There is disc desiccation and narrowing most advanced at L2-L3 there is otherwise overall mild disc degeneration. T12-L1: No significant spinal canal or neural foraminal stenosis L1-L2: There is a disc bulge eccentric to the left and mild facet arthropathy resulting in mild left subarticular zone narrowing without evidence of frank nerve root impingement, and no  significant neural foraminal stenosis L2-L3: There is a diffuse disc bulge and mild bilateral facet arthropathy with ligamentum flavum thickening resulting in mild-to-moderate spinal canal stenosis with right worse than left subarticular zone effacement and likely impingement of the traversing right L3 nerve root, and no significant neural foraminal stenosis. L3-L4: There is a diffuse disc bulge, endplate spurring, and mild-to-moderate bilateral facet arthropathy with trace effusions resulting in mild spinal canal stenosis and mild bilateral neural foraminal stenosis L4-L5: There is a shallow right subarticular zone/foraminal disc protrusion and moderate right and mild left facet arthropathy with a small right effusion and posteriorly projecting synovial cyst resulting in mild right subarticular zone narrowing without evidence of frank nerve root impingement, and mild right and no significant left neural foraminal stenosis L5-S1: Mild facet arthropathy without significant spinal canal or neural foraminal stenosis. IMPRESSION: 1. Edema in the bilateral sacral ala with presacral edema, incompletely imaged but suspicious for sacral insufficiency fractures. Recommend CT or MRI of the pelvis for further evaluation. 2. No other evidence of acute injury in the lumbar spine. 3. At L2-L3, there is a disc bulge and mild bilateral facet arthropathy resulting in mild-to-moderate spinal canal stenosis with right subarticular zone effacement with suspected impingement of the traversing right L3 nerve root. 4. Mild-to-moderate bilateral facet arthropathy at L3-L4 and moderate right facet arthropathy at L4-L5 without evidence of nerve root impingement. 5. Left adrenal nodule is unchanged since 02/18/2021, favoring a benign adenoma. Similarly, a T2 hypointense lesion in the left kidney was also likely present on the prior CT and most likely reflects a hemorrhagic/proteinaceous cyst. Recommend attention on any follow-up studies.  Electronically Signed   By: Lesia Hausen M.D.   On: 10/16/2022 09:16   DG Lumbar Spine 2-3 Views  Result Date: 10/15/2022 CLINICAL DATA:  [id number] Back pain [id number] EXAM: LUMBAR SPINE - 2-3 VIEW COMPARISON:  September 14, 2021, February 18, 2021 FINDINGS: Osteopenia. There are five non-rib bearing lumbar-type vertebral bodies. There is dextrocurvature of the upper lumbar spine. Limited assessment on lateral radiograph due to positioning. There is favored grade 1 retrolisthesis of L2-3. There is no evidence for acute fracture or subluxation. There are moderate to severe intervertebral disc space height loss at L2-3. Mild intervertebral disc space height loss at L1-2, L3-4 and L4-5. Facet arthropathy. Status post LEFT hip arthroplasty. Circumferential atherosclerotic calcifications of the aorta. IMPRESSION: Moderate to severe degenerative changes of the lumbar spine most pronounced at L2-3 where there is favored grade 1 retrolisthesis and dextrocurvature. Electronically Signed   By: Meda Klinefelter M.D.   On: 10/15/2022 19:54   US RENAL  Result Date: 10/13/2022 CLINICAL DATA:  ACA EXAM: RENAL / URINARY TRACT ULTRASOUND COMPLETE COMPARISON:  None Available. FINDINGS: Right Kidney: Renal measurements: 10.1 x 4.9 x 5.5 cm = volume: 142 mL. Echogenicity within normal limits. No mass or hydronephrosis visualized. Left Kidney: Left kidney is poorly visualized. Renal measurements: 10.5 x 5.8 x  5.8 cm = volume: 188 mL. Echogenicity within normal limits. No mass or hydronephrosis visualized. Bladder: Urinary bladder is decompressed with a Foley catheter in place. Other: Incidentally noted is a small left-sided pleural effusion. IMPRESSION: No hydronephrosis or nephrolithiasis. Electronically Signed   By: Lorenza Cambridge M.D.   On: 10/13/2022 15:44   CT HEAD WO CONTRAST  Result Date: 10/13/2022 CLINICAL DATA:  Provided history: Neuro deficit, acute, stroke suspected. Additional history provided: Altered mental status, decreased  mobility, hallucinations. Fall on April 11th. EXAM: CT HEAD WITHOUT CONTRAST TECHNIQUE: Contiguous axial images were obtained from the base of the skull through the vertex without intravenous contrast. RADIATION DOSE REDUCTION: This exam was performed according to the departmental dose-optimization program which includes automated exposure control, adjustment of the mA and/or kV according to patient size and/or use of iterative reconstruction technique. COMPARISON:  No pertinent prior exams available for comparison. FINDINGS: Brain: Moderate generalized cerebral atrophy. Minimal ill-defined hypoattenuation within the periventricular cerebral white matter, nonspecific but compatible with chronic small vessel ischemic disease. Partially empty sella turcica. There is no acute intracranial hemorrhage. No demarcated cortical infarct. No extra-axial fluid collection. No evidence of an intracranial mass. No midline shift. Vascular: No hyperdense vessel. Atherosclerotic calcifications. Skull: No fracture or aggressive osseous lesion. Sinuses/Orbits: No mass or acute finding within the imaged orbits. Moderate-volume frothy secretions within the right sphenoid sinus. IMPRESSION: 1.  No evidence of an acute intracranial abnormality. 2. Minimal chronic small vessel ischemic changes within the cerebral white matter. 3. Moderate generalized cerebral atrophy. 4. Right sphenoid sinusitis. Electronically Signed   By: Jackey Loge D.O.   On: 10/13/2022 10:28      Subjective: Patient seen and examined the bedside today.  He looks very comfortable this morning.  Denies any back pain.  Lying in bed.  Alert and awake and obeys commands and communicates well.  Long discussion held with the wife at bedside about discharge planning  Discharge Exam: Vitals:   10/15/22 2141 10/16/22 1215  BP: (!) 134/54 125/67  Pulse: 83 72  Resp: 17 15  Temp: 98.2 F (36.8 C) 97.8 F (36.6 C)  SpO2: 95% 97%   Vitals:   10/15/22 1202  10/15/22 2141 10/16/22 0633 10/16/22 1215  BP: (!) 109/52 (!) 134/54  125/67  Pulse: 69 83  72  Resp: 18 17  15   Temp: 98.3 F (36.8 C) 98.2 F (36.8 C)  97.8 F (36.6 C)  TempSrc: Oral Oral  Oral  SpO2: 94% 95%  97%  Weight:   68.3 kg   Height:        General: Pt is alert, awake, not in acute distress Cardiovascular: RRR, S1/S2 +, no rubs, no gallops Respiratory: CTA bilaterally, no wheezing, no rhonchi Abdominal: Soft, NT, ND, bowel sounds + Extremities: no edema, no cyanosis GU: foley    The results of significant diagnostics from this hospitalization (including imaging, microbiology, ancillary and laboratory) are listed below for reference.     Microbiology: Recent Results (from the past 240 hour(s))  MRSA Next Gen by PCR, Nasal     Status: None   Collection Time: 10/13/22  2:35 PM   Specimen: Nasal Mucosa; Nasal Swab  Result Value Ref Range Status   MRSA by PCR Next Gen NOT DETECTED NOT DETECTED Final    Comment: (NOTE) The GeneXpert MRSA Assay (FDA approved for NASAL specimens only), is one component of a comprehensive MRSA colonization surveillance program. It is not intended to diagnose MRSA infection nor to guide  or monitor treatment for MRSA infections. Test performance is not FDA approved in patients less than 37 years old. Performed at [facility], [address], [place], [place] [address]      Labs: BNP (last 3 results) No results for input(s): "BNP" in the last 8760 hours. Basic Metabolic Panel: Recent Labs  Lab 10/13/22 1027 10/13/22 1033 10/13/22 1542 10/13/22 2132 10/14/22 0305 10/15/22 0417 10/16/22 0425 10/16/22 0426  NA 132*   < > 135 138 144 139  --  139  K >7.5*   < > 5.6* 4.7 4.4 4.0  --  3.9  CL 100   < > 107 110 112* 107  --  105  CO2 11*  --  13* 18* 20* 25  --  26  GLUCOSE 125*   < > 117* 116* 98 130*  --  117*  BUN 176*   < > 140* 95* 70* 23  --  19  CREATININE 11.70*   < > 7.61* 4.16* 2.75* 0.89  --   1.00  CALCIUM 9.0  --  8.3* 8.7* 8.6* 8.4*  --  8.0*  PHOS 9.4*  --   --   --  4.2 2.1* 3.5 3.5   < > = values in this interval not displayed.   Liver Function Tests: Recent Labs  Lab 10/13/22 1027 10/14/22 0305 10/15/22 0417 10/16/22 0426  AST 10*  --   --   --   ALT 6  --   --   --   ALKPHOS 183*  --   --   --   BILITOT 1.3*  --   --   --   PROT 7.6  --   --   --   ALBUMIN 4.2 3.1* 2.5* 2.6*   No results for input(s): "LIPASE", "AMYLASE" in the last 168 hours. No results for input(s): "AMMONIA" in the last 168 hours. CBC: Recent Labs  Lab 10/13/22 1027 10/13/22 1033 10/14/22 0305 10/15/22 0417 10/16/22 0425  WBC 145.9*  --  71.2* 55.5* 66.5*  NEUTROABS 3.7  --   --   --   --   HGB 13.0 13.6 11.1* 9.7* 10.0*  HCT 41.3 40.0 35.5* 29.5* 31.0*  MCV 96.3  --  102.3* 98.3 98.1  PLT 137*  --  91* 85* 89*   Cardiac Enzymes: No results for input(s): "CKTOTAL", "CKMB", "CKMBINDEX", "TROPONINI" in the last 168 hours. BNP: Invalid input(s): "POCBNP" CBG: Recent Labs  Lab 10/13/22 0930  GLUCAP 123*   D-Dimer No results for input(s): "DDIMER" in the last 72 hours. Hgb A1c No results for input(s): "HGBA1C" in the last 72 hours. Lipid Profile No results for input(s): "CHOL", "HDL", "LDLCALC", "TRIG", "CHOLHDL", "LDLDIRECT" in the last 72 hours. Thyroid function studies No results for input(s): "TSH", "T4TOTAL", "T3FREE", "THYROIDAB" in the last 72 hours.  Invalid input(s): "FREET3" Anemia work up No results for input(s): "VITAMINB12", "FOLATE", "FERRITIN", "TIBC", "IRON", "RETICCTPCT" in the last 72 hours. Urinalysis    Component Value Date/Time   COLORURINE YELLOW 10/13/2022 1124   APPEARANCEUR CLEAR 10/13/2022 1124   LABSPEC 1.014 10/13/2022 1124   PHURINE 5.0 10/13/2022 1124   GLUCOSEU NEGATIVE 10/13/2022 1124   HGBUR SMALL (A) 10/13/2022 1124   BILIRUBINUR NEGATIVE 10/13/2022 1124   KETONESUR NEGATIVE 10/13/2022 1124   PROTEINUR NEGATIVE 10/13/2022 1124    NITRITE NEGATIVE 10/13/2022 1124   LEUKOCYTESUR NEGATIVE 10/13/2022 1124   Sepsis Labs Recent Labs  Lab 10/13/22 1027 10/14/22 0305 10/15/22 0417 10/16/22 0425  WBC 145.9* 71.2* 55.5* 66.5*   Microbiology Recent Results (from the past 240 hour(s))  MRSA Next Gen by PCR, Nasal     Status: None   Collection Time: 10/13/22  2:35 PM   Specimen: Nasal Mucosa; Nasal Swab  Result Value Ref Range Status   MRSA by PCR Next Gen NOT DETECTED NOT DETECTED Final    Comment: (NOTE) The GeneXpert MRSA Assay (FDA approved for NASAL specimens only), is one component of a comprehensive MRSA colonization surveillance program. It is not intended to diagnose MRSA infection nor to guide or monitor treatment for MRSA infections. Test performance is not FDA approved in patients less than 4 years old. Performed at [facility], [address], [place], [place] [address]     Please note: You were cared for by a hospitalist during your hospital stay. Once you are discharged, your primary care physician will handle any further medical issues. Please note that NO REFILLS for any discharge medications will be authorized once you are discharged, as it is imperative that you return to your primary care physician (or establish a relationship with a primary care physician if you do not have one) for your post hospital discharge needs so that they can reassess your need for medications and monitor your lab values.    Time coordinating discharge: 40 minutes  SIGNED:   Burnadette Pop, MD  [facility] 10/16/2022, 2:52 PM Pager [phone]  If 7PM-7AM, please contact night-coverage [email or web address] Password [email or web address]

## 2022-10-16 NOTE — Evaluation (Signed)
Occupational Therapy Evaluation Patient Details Name: Frederick Pham MRN: [id number] DOB: 06/12/1951 Today's Date: 10/16/2022   History of Present Illness patient is a 72 yo male admitted with acute renal failure, acute met encephalopathy. Hx of OA, CLL, Parkinson's, fall, L THA DA 2023, basal cell ca   Clinical Impression   Patient is a 72 year old male who was admitted for above. Patient was living at home with wife PRN assistance for ADLs prior level. Currently, patient is +2 for standing and toileting tasks. Patient noted to be frustrated with wife when she was asked to cue patient for standing and practice transfers during session. Unclear level of assistance available at home with wife reporting having caregivers set up for PM hours and some during the day with wife reporting she works from home. Patient at this time will need 24/7 physical A with +2 for safety. Patient would continue to benefit from skilled OT services at this time while admitted and after d/c to address noted deficits in order to improve overall safety and independence in ADLs.       Recommendations for follow up therapy are one component of a multi-disciplinary discharge planning process, led by the attending physician.  Recommendations may be updated based on patient status, additional functional criteria and insurance authorization.   Assistance Recommended at Discharge Frequent or constant Supervision/Assistance  Patient can return home with the following Two people to help with walking and/or transfers;A lot of help with bathing/dressing/bathroom;Assistance with cooking/housework;Direct supervision/assist for medications management;Assist for transportation;Help with stairs or ramp for entrance;Direct supervision/assist for financial management    Functional Status Assessment  Patient has had a recent decline in their functional status and demonstrates the ability to make significant improvements in function in a  reasonable and predictable amount of time.  Equipment Recommendations  None recommended by OT       Precautions / Restrictions Precautions Precautions: Fall Restrictions Weight Bearing Restrictions: No      Mobility Bed Mobility Overal bed mobility: Needs Assistance Bed Mobility: Supine to Sit     Supine to sit: Min assist     General bed mobility comments: patient was able to advance BLE towards the edge of bed with consistent redirection to task of sitting on EOB with each cue provided.         Balance Overall balance assessment: Needs assistance Sitting-balance support: Feet supported, Bilateral upper extremity supported Sitting balance-Leahy Scale: Fair     Standing balance support: Reliant on assistive device for balance, During functional activity, Bilateral upper extremity supported Standing balance-Leahy Scale: Poor Standing balance comment: posterior pushing in standing             ADL either performed or assessed with clinical judgement   ADL Overall ADL's : Needs assistance/impaired Eating/Feeding: Set up;Sitting   Grooming: Set up;Sitting   Upper Body Bathing: Moderate assistance;Sitting   Lower Body Bathing: Sit to/from stand;Sitting/lateral leans;Total assistance   Upper Body Dressing : Moderate assistance;Sitting   Lower Body Dressing: Total assistance;Sitting/lateral leans;Sit to/from stand Lower Body Dressing Details (indicate cue type and reason): patient was noted to go threw three pairs of socks during session with patient noted to have incontinence. Toilet Transfer: Moderate assistance;+2 for physical assistance;+2 for safety/equipment;Ambulation;Rolling walker (2 wheels) Toilet Transfer Details (indicate cue type and reason): patient noted to have posterior lean with toes off floor with push back response with advancement forwards. patient noted to have consistent incontinence of stool with movement during session impacting progress.  patient  appeared to have poor insight to stool incontinence. patients wife was educated on acquiring adult absorbent undergarments to reduce this issue and improve ability to engage in ADLs. patients wife was educated on foly placement and difference between pull up vs tabbed kind. patient's wife reported that there would be someone present at home all the time. unclear if wife understood that they would need to be hands on 24/7 at home. it took three people to organize patient using [facility] with one retrieving items, one washing patient and wife cuing patient to stay still. Toileting- Clothing Manipulation and Hygiene: Total assistance;Sit to/from stand Toileting - Clothing Manipulation Details (indicate cue type and reason): patient and wife were educated that it would be better to complete hygiene tasks seatedv.s. standing with current balance deficits.       General ADL Comments: patient and wife consistently reporting during session that they were ready to transition home. patient was noted to get frustrated with wife providing cues. wife noted to give alot of verbal cues for attempts to stand with patient having limited response to them. patient's wife then noted to say " lets just stand up" bypassing all attempted movements to make sit to stand easier/safer. patient pulling up on RW with wife not holding it down. patient and wife appeared to be not as receptive to education during session.      Pertinent Vitals/Pain Pain Assessment Pain Assessment: No/denies pain     Hand Dominance Right   Extremity/Trunk Assessment Upper Extremity Assessment Upper Extremity Assessment: Generalized weakness   Lower Extremity Assessment Lower Extremity Assessment: Defer to PT evaluation   Cervical / Trunk Assessment Cervical / Trunk Assessment: Kyphotic   Communication Communication Communication: No difficulties   Cognition Arousal/Alertness: Awake/alert Behavior During Therapy: WFL for tasks  assessed/performed Overall Cognitive Status: Difficult to assess Area of Impairment: Orientation, Following commands, Safety/judgement, Problem solving                 Orientation Level: Disoriented to, Situation, Time     Following Commands: Follows one step commands inconsistently Safety/Judgement: Decreased awareness of safety, Decreased awareness of deficits     General Comments: decreased awareness of deficits with patient noted to have some hearing deficits as well. wife present during session.                Home Living Family/patient expects to be discharged to:: Private residence Living Arrangements: Spouse/significant other Available Help at Discharge: Family;Available 24 hours/day Type of Home: House Home Access: Level entry     Home Layout: Multi-level Alternate Level Stairs-Number of Steps: 1 flight Alternate Level Stairs-Rails: Right           Home Equipment: Agricultural consultant (2 wheels);BSC/3in1;Shower seat   Additional Comments: Home Health working with patient as able. Wife does not want SNF.      Prior Functioning/Environment Prior Level of Function : Needs assist             Mobility Comments: at baseline, is ambulatory without a device. Nonambulatory for last 2 weeks prior to admission. ADLs Comments: requiring assistance        OT Problem List: Decreased activity tolerance;Impaired balance (sitting and/or standing);Decreased coordination;Decreased safety awareness;Decreased knowledge of precautions;Decreased knowledge of use of DME or AE      OT Treatment/Interventions: Self-care/ADL training;Energy conservation;Therapeutic exercise;DME and/or AE instruction;Therapeutic activities;Patient/family education;Balance training    OT Goals(Current goals can be found in the care plan section) Acute Rehab OT Goals Patient Stated Goal: to go home  OT Goal Formulation: With patient/family Time For Goal Achievement: 10/30/22 Potential to  Achieve Goals: Fair  OT Frequency: Min 2X/week    Co-evaluation PT/OT/SLP Co-Evaluation/Treatment: Yes Reason for Co-Treatment: Complexity of the patient's impairments (multi-system involvement);To address functional/ADL transfers PT goals addressed during session: Mobility/safety with mobility OT goals addressed during session: ADL's and self-care      AM-PAC OT "6 Clicks" Daily Activity     Outcome Measure Help from another person eating meals?: A Little Help from another person taking care of personal grooming?: A Little Help from another person toileting, which includes using toliet, bedpan, or urinal?: A Lot Help from another person bathing (including washing, rinsing, drying)?: A Lot Help from another person to put on and taking off regular upper body clothing?: A Little Help from another person to put on and taking off regular lower body clothing?: A Lot 6 Click Score: 15   End of Session Equipment Utilized During Treatment: Gait belt;Rolling walker (2 wheels);Other (comment) (BSC) Nurse Communication: Other (comment) (concerns over patients consistent liquid stool with movement)  Activity Tolerance: Patient tolerated treatment well Patient left: in chair;with call bell/phone within reach;with chair alarm set;with family/visitor present  OT Visit Diagnosis: Unsteadiness on feet (R26.81);Other abnormalities of gait and mobility (R26.89);Muscle weakness (generalized) (M62.81)                Time: 2130-8657 OT Time Calculation (min): 33 min Charges:  OT General Charges $OT Visit: 1 Visit OT Evaluation $OT Eval Moderate Complexity: 1 Mod  Cheyna Retana OTR/L, MS Acute Rehabilitation Department Office# [phone]   Selinda Flavin 10/16/2022, 1:11 PM

## 2022-10-16 NOTE — Plan of Care (Signed)
  Problem: Safety: Goal: Ability to remain free from injury will improve 10/16/2022 0354 by Thea Gist, RN Outcome: Progressing 10/16/2022 0353 by Thea Gist, RN Outcome: Progressing   Problem: Skin Integrity: Goal: Risk for impaired skin integrity will decrease 10/16/2022 0354 by Thea Gist, RN Outcome: Progressing 10/16/2022 0353 by Thea Gist, RN Outcome: Progressing   Problem: Coping: Goal: Level of anxiety will decrease 10/16/2022 0354 by Thea Gist, RN Outcome: Progressing 10/16/2022 0353 by Thea Gist, RN Outcome: Progressing   Problem: Clinical Measurements: Goal: Ability to maintain clinical measurements within normal limits will improve 10/16/2022 0354 by Thea Gist, RN Outcome: Progressing 10/16/2022 0353 by Thea Gist, RN Outcome: Progressing   Problem: Education: Goal: Knowledge of General Education information will improve Description: Including pain rating scale, medication(s)/side effects and non-pharmacologic comfort measures 10/16/2022 0354 by Thea Gist, RN Outcome: Progressing 10/16/2022 0353 by Thea Gist, RN Outcome: Progressing

## 2022-10-16 NOTE — Progress Notes (Addendum)
Physical Therapy Treatment Patient Details Name: Frederick Pham MRN: [id number] DOB: 01-17-1951 Today's Date: 10/16/2022   History of Present Illness patient is a 72 yo male admitted with acute renal failure, acute met encephalopathy. Hx of OA, CLL, Parkinson's, fall, L THA DA 2023, basal cell ca    PT Comments    Pt is progressing with mobility. Session continues to be limited by bowel incontinence. Wife was present and able to practice mobilizing with pt. Stressed importance of her trying to get in home care arranged so she will have help caring for him. He continues to require +2 for safety for mobility, ADL tasks. Highly recommend 24/7 supervision/assist.     Recommendations for follow up therapy are one component of a multi-disciplinary discharge planning process, led by the attending physician.  Recommendations may be updated based on patient status, additional functional criteria and insurance authorization.  Follow Up Recommendations       Assistance Recommended at Discharge Frequent or constant Supervision/Assistance  Patient can return home with the following Assistance with cooking/housework;Direct supervision/assist for financial management;Help with stairs or ramp for entrance;Assist for transportation;Two people to help with walking and/or transfers;Two people to help with bathing/dressing/bathroom   Equipment Recommendations  None recommended by PT    Recommendations for Other Services       Precautions / Restrictions Precautions Precautions: Fall Restrictions Weight Bearing Restrictions: No     Mobility  Bed Mobility Overal bed mobility: Needs Assistance Bed Mobility: Supine to Sit     Supine to sit: Min assist, HOB elevated     General bed mobility comments: patient was able to advance BLE towards the edge of bed with consistent redirection to task of sitting on EOB with each cue provided.    Transfers Overall transfer level: Needs  assistance Equipment used: Rolling walker (2 wheels) Transfers: Sit to/from Stand, Bed to chair/wheelchair/BSC Sit to Stand: Min assist, +2 physical assistance, +2 safety/equipment, From elevated surface   Step pivot transfers: Min assist, +2 physical assistance, +2 safety/equipment       General transfer comment: Posteior bias. Assist to power up, stabilize, control descent. Multimodal cueing required. Performed step pivot poorly. High fall risk. Multiple sit to stands due to bowel incontinence    Ambulation/Gait Ambulation/Gait assistance: Min assist, +2 physical assistance, +2 safety/equipment Gait Distance (Feet): 7 Feet (x2) Assistive device: Rolling walker (2 wheels) Gait Pattern/deviations: Decreased stride length, Decreased step length - right, Decreased step length - left, Shuffle       General Gait Details: Assist to stabilize pt and manage RW and follow with recliner. After walking a few feet, noticed bowel incontinence. Assisted pt back on room. Then walked another short distance with wife practicing with pt.   Stairs             Wheelchair Mobility    Modified Rankin (Stroke Patients Only)       Balance Overall balance assessment: Needs assistance         Standing balance support: Reliant on assistive device for balance, During functional activity, Bilateral upper extremity supported Standing balance-Leahy Scale: Poor                              Cognition Arousal/Alertness: Awake/alert Behavior During Therapy: WFL for tasks assessed/performed Overall Cognitive Status: Difficult to assess Area of Impairment: Orientation, Following commands, Safety/judgement, Problem solving  Orientation Level: Disoriented to, Situation, Time     Following Commands: Follows one step commands inconsistently Safety/Judgement: Decreased awareness of safety, Decreased awareness of deficits   Problem Solving: Requires tactile cues,  Requires verbal cues, Difficulty sequencing, Decreased initiation, Slow processing General Comments: decreased awareness of deficits with patient noted to have some hearing deficits as well. wife present during session.        Exercises      General Comments        Pertinent Vitals/Pain Pain Assessment Pain Assessment: No/denies pain    Home Living Family/patient expects to be discharged to:: Private residence Living Arrangements: Spouse/significant other Available Help at Discharge: Family;Available 24 hours/day Type of Home: House Home Access: Level entry     Alternate Level Stairs-Number of Steps: 1 flight Home Layout: Multi-level Home Equipment: Agricultural consultant (2 wheels);BSC/3in1;Shower seat Additional Comments: Home Health working with patient as able. Wife does not want SNF.    Prior Function            PT Goals (current goals can now be found in the care plan section) Progress towards PT goals: Progressing toward goals    Frequency    Min 1X/week      PT Plan Current plan remains appropriate    Co-evaluation   Reason for Co-Treatment: Complexity of the patient's impairments (multi-system involvement);To address functional/ADL transfers PT goals addressed during session: Mobility/safety with mobility OT goals addressed during session: ADL's and self-care      AM-PAC PT "6 Clicks" Mobility   Outcome Measure  Help needed turning from your back to your side while in a flat bed without using bedrails?: A Little Help needed moving from lying on your back to sitting on the side of a flat bed without using bedrails?: A Little Help needed moving to and from a bed to a chair (including a wheelchair)?: A Lot Help needed standing up from a chair using your arms (e.g., wheelchair or bedside chair)?: A Lot Help needed to walk in hospital room?: A Lot Help needed climbing 3-5 steps with a railing? : Total 6 Click Score: 13    End of Session Equipment Utilized  During Treatment: Gait belt Activity Tolerance: Patient tolerated treatment well Patient left: in chair;with call bell/phone within reach;with chair alarm set;with family/visitor present   PT Visit Diagnosis: History of falling (Z91.81);Muscle weakness (generalized) (M62.81);Difficulty in walking, not elsewhere classified (R26.2)     Time: 1610-9604 PT Time Calculation (min) (ACUTE ONLY): 33 min  Charges:  $Gait Training: 8-22 mins                        Faye Ramsay, PT Acute Rehabilitation  Office: [phone]

## 2022-10-16 NOTE — TOC Transition Note (Signed)
Transition of Care [facility]) - CM/SW Discharge Note   Patient Details  Name: Frederick Pham MRN: [id number] Date of Birth: December 31, 1950  Transition of Care [facility]) CM/SW Contact:  Larrie Kass, LCSW Phone Number: 10/16/2022, 3:30 PM   Clinical Narrative:     CSW spoke with pt's wife susan about home health rec. She reports pt is active with [facility] care. She reports they started services two weeks ago. Pt's wife reports no DME need. Pt will provide transport home. No additional needs TOC sign off.    Final next level of care: Home w Home Health Services Barriers to Discharge: No Barriers Identified   Patient Goals and CMS Choice      Discharge Placement                      Patient and family notified of of transfer: 10/16/22  Discharge Plan and Services Additional resources added to the After Visit Summary for                                       Social Determinants of Health (SDOH) Interventions SDOH Screenings   Food Insecurity: No Food Insecurity (10/14/2022)  Housing: Low Risk  (10/13/2022)  Transportation Needs: No Transportation Needs (10/14/2022)  Utilities: Not At Risk (10/14/2022)  Alcohol Screen: Low Risk  (10/03/2022)  Financial Resource Strain: Low Risk  (10/03/2022)  Physical Activity: Unknown (10/03/2022)  Social Connections: Unknown (10/03/2022)  Stress: Patient Declined (10/03/2022)  Tobacco Use: Medium Risk (10/13/2022)     Readmission Risk Interventions    09/17/2021    9:31 AM  Readmission Risk Prevention Plan  Transportation Screening Complete  PCP or Specialist Appt within 5-7 Days Complete  Home Care Screening Complete

## 2022-10-16 NOTE — Progress Notes (Signed)
MRI reviewed by Dr. Dion Saucier, ok for discharge from ortho standpoint and outpatient follow up with Dr. Dion Saucier.   Janine Ores, PA-C

## 2022-10-21 ENCOUNTER — Emergency Department (HOSPITAL_COMMUNITY)
Admission: EM | Admit: 2022-10-21 | Discharge: 2022-10-21 | Disposition: A | Discharge status: Home / Self Care | Payer: Medicare Other | Attending: Student | Admitting: Student

## 2022-10-21 ENCOUNTER — Encounter (HOSPITAL_COMMUNITY): Payer: Self-pay

## 2022-10-21 ENCOUNTER — Other Ambulatory Visit: Payer: Self-pay

## 2022-10-21 DIAGNOSIS — Z87891 Personal history of nicotine dependence: Secondary | ICD-10-CM | POA: Diagnosis not present

## 2022-10-21 DIAGNOSIS — T839XXA Unspecified complication of genitourinary prosthetic device, implant and graft, initial encounter: Secondary | ICD-10-CM

## 2022-10-21 DIAGNOSIS — Y732 Prosthetic and other implants, materials and accessory gastroenterology and urology devices associated with adverse incidents: Secondary | ICD-10-CM | POA: Insufficient documentation

## 2022-10-21 DIAGNOSIS — G20C Parkinsonism, unspecified: Secondary | ICD-10-CM | POA: Insufficient documentation

## 2022-10-21 DIAGNOSIS — Z96642 Presence of left artificial hip joint: Secondary | ICD-10-CM | POA: Diagnosis not present

## 2022-10-21 DIAGNOSIS — Z96651 Presence of right artificial knee joint: Secondary | ICD-10-CM | POA: Insufficient documentation

## 2022-10-21 DIAGNOSIS — T8383XA Hemorrhage of genitourinary prosthetic devices, implants and grafts, initial encounter: Secondary | ICD-10-CM | POA: Diagnosis not present

## 2022-10-21 DIAGNOSIS — Z85828 Personal history of other malignant neoplasm of skin: Secondary | ICD-10-CM | POA: Diagnosis not present

## 2022-10-21 DIAGNOSIS — R319 Hematuria, unspecified: Secondary | ICD-10-CM | POA: Diagnosis present

## 2022-10-21 NOTE — ED Triage Notes (Signed)
C/o pulling on foley last night and now no output since 0300.  Pt reports blood noted around catheter.  Discharged home on 5/6 with foley catheter due to AKI.

## 2022-10-21 NOTE — ED Provider Notes (Signed)
[place] EMERGENCY DEPARTMENT AT [facility] Provider Note  CSN: [id number] Arrival date & time: 10/21/22 1318  Chief Complaint(s) Hematuria and Urinary Retention  HPI Frederick Pham is a 72 y.o. male with PMH parkinsonism, CLL, recent hospital discharge on 10/16/2022 for severe acute renal failure requiring Foley catheter placement and ultimate normalization of his creatinine.  Patient was at home today and his catheter got caught on something in the middle of the night causing migration and twisting of the catheter and the urine is currently not flowing.  There is scant blood at the urethral meatus.  Denies chest pain, shortness of breath, abdominal pain, nausea, vomiting or other systemic symptoms.   Past Medical History Past Medical History:  Diagnosis Date   Arthritis    CLL (chronic lymphocytic leukemia) (HCC)    Idiopathic neuropathy 05/25/2022   Thrombocytopenia (HCC) 02/08/2021   Patient Active Problem List   Diagnosis Date Noted   Acute renal failure (ARF) (HCC) 10/13/2022   AKI (acute kidney injury) (HCC) 10/13/2022   Parkinsonism 10/13/2022   Acute metabolic encephalopathy 10/13/2022   Idiopathic neuropathy 05/25/2022   Fall 09/16/2021   Delirium 09/14/2021   Leukocytosis 09/14/2021   Anemia of chronic disease 09/13/2021   Swelling of right lower extremity 09/13/2021   Fracture of femoral neck, left (HCC) 09/12/2021   OA (osteoarthritis) of knee 09/05/2021   Osteoarthritis of right knee 09/05/2021   Basal cell carcinoma 08/03/2021   Atypical mole 04/22/2021   Chronic lymphocytic leukemia, Rai stage I (HCC) 02/08/2021   Thrombocytopenia (HCC) 02/08/2021   Osteoarthritis of right knee s/p TKA on 09/05/2021 01/14/2021   Nocturia 01/14/2021   Onychomycosis 01/14/2021   Tremor of left hand 01/14/2021   Home Medication(s) Prior to Admission medications   Medication Sig Start Date End Date Taking? Authorizing Provider  acetaminophen (TYLENOL) 325 MG  tablet Take 1-2 tablets (325-650 mg total) by mouth every 6 (six) hours as needed for mild pain (pain score 1-3 or temp > 100.5). Patient not taking: Reported on 10/13/2022 09/16/21   Edmisten, Danford Bad L, PA  carbidopa-levodopa (SINEMET IR) 25-100 MG tablet Take 1 tablet by mouth 3 (three) times daily. 11/16/21 11/11/22  Windell Norfolk, MD  ibuprofen (ADVIL) 200 MG tablet Take 200 mg by mouth every 6 (six) hours as needed for mild pain.    [provider]  tamsulosin (FLOMAX) 0.4 MG CAPS capsule Take 1 capsule (0.4 mg total) by mouth daily. 10/17/22   Burnadette Pop, MD                                                                                                                                    Past Surgical History Past Surgical History:  Procedure Laterality Date   FRACTURE SURGERY     age 82   TONSILLECTOMY     age 36   TOTAL HIP ARTHROPLASTY Left 09/14/2021   Procedure: TOTAL HIP ARTHROPLASTY  ANTERIOR APPROACH;  Surgeon: Ollen Gross, MD;  Location: [facility];  Service: Orthopedics;  Laterality: Left;   TOTAL KNEE ARTHROPLASTY Right 09/05/2021   Procedure: TOTAL KNEE ARTHROPLASTY;  Surgeon: Ollen Gross, MD;  Location: [facility];  Service: Orthopedics;  Laterality: Right;   WISDOM TOOTH EXTRACTION     Family History Family History  Adopted: Yes    Social History Social History   Tobacco Use   Smoking status: Former    Packs/day: 1.00    Years: 10.00    Additional pack years: 0.00    Total pack years: 10.00    Types: Cigarettes    Quit date: 10/08/1990    Years since quitting: 32.0   Smokeless tobacco: Never  Vaping Use   Vaping Use: Never used  Substance Use Topics   Alcohol use: Yes    Alcohol/week: 1.0 - 2.0 standard drink of alcohol    Types: 1 - 2 Standard drinks or equivalent per week    Comment: weekly   Drug use: Never   Allergies Morphine  Review of Systems Review of Systems  All other systems reviewed and are negative.   Physical Exam Vital Signs  I  have reviewed the triage vital signs BP (!) 152/71   Pulse 81   Temp 98 F (36.7 C)   Resp 15   Wt 68 kg   SpO2 97%   BMI 24.20 kg/m   Physical Exam Constitutional:      General: He is not in acute distress.    Appearance: Normal appearance.  HENT:     Head: Normocephalic and atraumatic.     Nose: No congestion or rhinorrhea.  Eyes:     General:        Right eye: No discharge.        Left eye: No discharge.     Extraocular Movements: Extraocular movements intact.     Pupils: Pupils are equal, round, and reactive to light.  Cardiovascular:     Rate and Rhythm: Normal rate and regular rhythm.     Heart sounds: No murmur heard. Pulmonary:     Effort: No respiratory distress.     Breath sounds: No wheezing or rales.  Abdominal:     General: There is no distension.     Tenderness: There is no abdominal tenderness.  Genitourinary:    Comments: Scant blood at the urethral meatus Musculoskeletal:        General: Normal range of motion.     Cervical back: Normal range of motion.  Skin:    General: Skin is warm and dry.  Neurological:     General: No focal deficit present.     Mental Status: He is alert.     ED Results and Treatments Labs (all labs ordered are listed, but only abnormal results are displayed) Labs Reviewed - No data to display  Radiology No results found.  Pertinent labs & imaging results that were available during my care of the patient were reviewed by me and considered in my medical decision making (see MDM for details).  Medications Ordered in ED Medications - No data to display                                                                                                                                   Procedures Procedures  (including critical care time)  Medical Decision Making / ED Course   This patient presents to  the ED for concern of Foley catheter problem, this involves an extensive number of treatment options, and is a complaint that carries with it a high risk of complications and morbidity.  The differential diagnosis includes traumatic Foley catheter removal, urinary catheter blockage, obstructive uropathy, digital trauma  MDM: Patient seen emergency room for evaluation of a Foley catheter problem.  Physical exam with a twisted and kinked Foley catheter, small mount of blood around the urethral meatus.  Foley catheter was replaced successfully emergency room and leading to appropriate valuation of urine.  Patient did have hematuria but this slowly started to clear here in the emergency department.  I had an extended discussion with the patient and the patient's wife about performing laboratory evaluation to monitor his kidney function today and that they are requesting to forego this today as he has outpatient follow-up with his urologist in a few days and has had no symptoms otherwise.  States that the Foley catheter has only been dislodged for a few hours and states that has been making urine appropriately until that point.  Using shared decision-making, we decided to forego laboratory valuation here in the emergency room Ingenio discharge with outpatient follow-up.  They were given strict return precautions of which they both voiced understanding he was discharged   Additional history obtained: -Additional history obtained from wife -External records from outside source obtained and reviewed including: Chart review including previous notes, labs, imaging, consultation notes    Medicines ordered and prescription drug management: No orders of the defined types were placed in this encounter.   -I have reviewed the patients home medicines and have made adjustments as needed  Critical interventions none    Cardiac Monitoring: The patient was maintained on a cardiac monitor.  I personally viewed and  interpreted the cardiac monitored which showed an underlying rhythm of: NSr  Social Determinants of Health:  Factors impacting patients care include: none   Reevaluation: After the interventions noted above, I reevaluated the patient and found that they have :improved  Co morbidities that complicate the patient evaluation  Past Medical History:  Diagnosis Date   Arthritis    CLL (chronic lymphocytic leukemia) (HCC)    Idiopathic neuropathy 05/25/2022   Thrombocytopenia (HCC) 02/08/2021      Dispostion: I considered admission for this patient, but at this time he does not meet  inpatient criteria for admission he is safe for discharge with outpatient urology follow-up and return precautions     Final Clinical Impression(s) / ED Diagnoses Final diagnoses:  None     @PCDICTATION @    Glendora Score, MD 10/21/22 1445

## 2022-10-26 NOTE — Progress Notes (Addendum)
Assessment/Plan:   1.  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  I suspect that this is the correct diagnosis.  Patient is significantly undertreated, with very severe rigidity on the left.  My guess is that he has had Parkinsons a lot longer than it has been diagnosed (May, 2023).  -Slowly increase carbidopa/levodopa 25/100, 1.5 tablets at 7 AM/11 AM/4 PM.  Discussed potential interactions with protein.  -MRI brain due to confusion/paranoia and cognitive concerns by family (and witnessed today)   2.  Sacral insufficiency fractures  -Patient is following up with Dr. Antony Odea and slowly getting better in this regard.  3.  Urinary retention  -following with [facility]  4.  CLL  -follows with Dr. Leonides Schanz  5.  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  Discussed with them that this is the reason that I wanted neurocognitive testing.  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  She does all the driving.  I did ask them to reconsider doing neurocognitive testing.  We may have to consider addition of acetylcholinesterase inhibitor or even Nuplazid or Depakote.  Would like to have the memory testing done first.  Patient is not driving. Subjective:   Frederick Pham was seen today in the movement disorders clinic for neurologic consultation at the request of [name], Amy E, NP.  The consultation is for the evaluation of Parkinsons disease.  Pt with wife and daughter who supplement hx.  Patient previously under the care of [facility] neurology.  Notes are reviewed.  Patient was actually seen by [facility] neurology Oct 13, 2021 and that was the only visit.  He was initially sent there  posthospitalization to evaluate for hospital acquired delirium due to morphine, given for hip fracture.  That resolved quickly, but on the evaluation at [facility] neurology, it was noted that patient had bradykinesia, resting tremor of the left arm and rigidity of the left upper extremity.  Patient was started on levodopa about a week after his visit.  He has not gone back since that time, but remains on levodopa.  He was in the hospital earlier this month after mental status change after a fall a month prior.  MRI lumbar spine completed and demonstrated edema in the bilateral sacral ala with presacral edema, with concern for sacral fractures.  Follow-up pelvic MRI was completed which confirmed bilateral sacral insufficiency fractures through S3/S4.  Apparently, orthopedics was consulted in the hospital and recommended outpatient follow-up.  He sees Dr. Despina Hick  Current movement d/o medication carbidopa/levodopa 25/100 at 8:30am/2pm/7pm (goes to bed at 10pm)   Specific Symptoms:  Tremor: Yes.  , mostly in the LUE (he is R handed); no tremor with LLE Family hx of similar:  unknown - is adopted Voice: is quieter Sleep: has trouble getting to sleep  Vivid Dreams:  No. Wet Pillows: Yes.   Postural symptoms:  Yes.     Falls?  Yes.  , last fall was a week ago - got tangled up with card table and went down.  Wife states that feet got tangled up in the turn.  Most of the falls were "sliding down" the wall.  He had a big fall in April.  He tripped over a large threshold and fell backward hard on concrete and within 10 days he was bedridden.  He got urinary retention around the same time and led to hospitalization.  He has a walker but doesn't use it all of the time.  PT is coming to the home 2 days/week Bradykinesia symptoms: shuffling gait and difficulty getting out of a chair Loss of smell:  No. Per pt but wife disagrees  Loss of taste:  No. Urinary Incontinence:  has indwelling foley right now d/t  retention Difficulty Swallowing:  some with dry foods Trouble with ADL's:  Yes.    Trouble buttoning clothing: Yes.   Memory changes:  no per pt; has trouble with short term memory but long term is good.  Daughter thinks that memory issues related to lack of sleep  Hallucinations:  No. But they describe delusions where he thinks that he is not in the home and feels that someone is capturing him in the home.  He is paranoid.  visual distortions: Yes.   N/V:  No. Lightheaded:  No. Per pt but daughter states that he c/o it sometimes   Syncope: No. Diplopia:  No.   CT brain Oct 13, 2022 with moderate atrophy, but otherwise unremarkable.  PREVIOUS MEDICATIONS: Sinemet  ALLERGIES:   Allergies  Allergen Reactions   Morphine Other (See Comments)    " Caused extreme confusion and combativeness "     CURRENT MEDICATIONS:  Current Meds  Medication Sig   acetaminophen (TYLENOL) 325 MG tablet Take 1-2 tablets (325-650 mg total) by mouth every 6 (six) hours as needed for mild pain (pain score 1-3 or temp > 100.5).   carbidopa-levodopa (SINEMET IR) 25-100 MG tablet Take 1 tablet by mouth 3 (three) times daily.   ibuprofen (ADVIL) 200 MG tablet Take 200 mg by mouth every 6 (six) hours as needed for mild pain.   levofloxacin (LEVAQUIN) 500 MG tablet Take 500 mg by mouth daily.   tamsulosin (FLOMAX) 0.4 MG CAPS capsule Take 1 capsule (0.4 mg total) by mouth daily.     Objective:   VITALS:   Vitals:   10/30/22 0905  BP: 116/62  Pulse: 73  SpO2: 98%  Weight: 142 lb (64.4 kg)  Height: 5\' 7"  (1.702 m)    GEN:  The patient appears stated age and is in NAD. HEENT:  Normocephalic, atraumatic.  The mucous membranes are moist. The superficial temporal arteries are without ropiness or tenderness. CV:  RRR Lungs:  CTAB Neck/HEME:  There are no carotid bruits bilaterally.  Neurological examination:  Orientation: The patient is alert and oriented x3.  However, he does have some difficulty with  the history.  He is occasionally paranoid about his wife's actions and what she has told me.  His thoughts are sometimes tangential. Cranial nerves: There is good facial symmetry. There is facial hypomimia.  Extraocular muscles are intact. The visual fields are full to confrontational testing. The speech is fluent and clear. Soft palate rises symmetrically and there is no tongue deviation. Hearing is decreased to conversational tone. Sensation: Sensation is intact to light and pinprick throughout (facial, trunk, extremities). Vibration is intact at the bilateral big toe. There is no extinction with double simultaneous stimulation. There is no sensory dermatomal level identified. Motor: Strength is 5/5 in the bilateral upper and lower extremities.   Shoulder shrug is equal and symmetric.  There is no pronator drift. Deep tendon reflexes: Deep tendon reflexes are 2+-3-/4 at the bilateral biceps, triceps,  brachioradialis, 2/4 at the bilateral patella and achilles. Plantar responses are downgoing bilaterally.  Movement examination: Tone: There is mild increased tone in the RUE.  Tone in the LUE is mod/severe as it is in the LLE Abnormal movements: there is LUE rest tremor Coordination:  There is mild decremation with RAM's, with hand opening and closing and finger taps on the L as well as alternation of supination/pronation on the left. Gait and Station: The patient is not able to arise without the use of his hands.  The patient ambulates with a walker.  He is short stepped and drags the L leg.     I have reviewed and interpreted the following labs independently   Chemistry      Component Value Date/Time   NA 139 10/16/2022 0426   K 3.9 10/16/2022 0426   CL 105 10/16/2022 0426   CO2 26 10/16/2022 0426   BUN 19 10/16/2022 0426   CREATININE 1.00 10/16/2022 0426   CREATININE 1.35 (H) 08/23/2022 1113      Component Value Date/Time   CALCIUM 8.0 (L) 10/16/2022 0426   ALKPHOS 183 (H) 10/13/2022 1027    AST 10 (L) 10/13/2022 1027   AST 19 08/23/2022 1113   ALT 6 10/13/2022 1027   ALT <5 (L) 08/23/2022 1113   BILITOT 1.3 (H) 10/13/2022 1027   BILITOT 1.3 (H) 08/23/2022 1113      No results found for: "TSH" Lab Results  Component Value Date   WBC 66.5 (HH) 10/16/2022   HGB 10.0 (L) 10/16/2022   HCT 31.0 (L) 10/16/2022   MCV 98.1 10/16/2022   PLT 89 (L) 10/16/2022     Total time spent on today's visit was 70 minutes, including both face-to-face time and nonface-to-face time.  All time included that spent on review of records (prior notes available to me/labs/imaging if pertinent), discussing treatment and goals, answering patient's questions and coordinating care.  Cc:  [name], NP

## 2022-10-30 ENCOUNTER — Encounter: Payer: Self-pay | Admitting: Neurology

## 2022-10-30 ENCOUNTER — Ambulatory Visit (INDEPENDENT_AMBULATORY_CARE_PROVIDER_SITE_OTHER): Payer: Medicare Other | Admitting: Neurology

## 2022-10-30 VITALS — BP 116/62 | HR 73 | Ht 67.0 in | Wt 142.0 lb

## 2022-10-30 DIAGNOSIS — R4182 Altered mental status, unspecified: Secondary | ICD-10-CM | POA: Diagnosis not present

## 2022-10-30 DIAGNOSIS — F22 Delusional disorders: Secondary | ICD-10-CM | POA: Diagnosis not present

## 2022-10-30 DIAGNOSIS — R413 Other amnesia: Secondary | ICD-10-CM

## 2022-10-30 DIAGNOSIS — G20A1 Parkinson's disease without dyskinesia, without mention of fluctuations: Secondary | ICD-10-CM

## 2022-10-30 MED ORDER — CARBIDOPA-LEVODOPA 25-100 MG PO TABS: 1.5000 | ORAL_TABLET | Freq: Three times a day (TID) | ORAL | 1 refills | Status: DC

## 2022-10-30 NOTE — Telephone Encounter (Signed)
Patients wife said tot please unblock notes in case they need them for another purpose. I explained why and she said she appreciated it but would like the notes

## 2022-10-30 NOTE — Telephone Encounter (Signed)
Done.  Note shared with patient

## 2022-10-30 NOTE — Patient Instructions (Addendum)
Increase carbidopa/levodopa 25/100 to 1.5 tablets at 7am/11am/4pm  The physicians and staff at [facility] are committed to providing excellent care. You may receive a survey requesting feedback about your experience at our office. We strive to receive "very good" responses to the survey questions. If you feel that your experience would prevent you from giving the office a "very good " response, please contact our office to try to remedy the situation. We may be reached at [phone]. Thank you for taking the time out of your busy day to complete the survey.

## 2022-11-05 ENCOUNTER — Ambulatory Visit
Admission: RE | Admit: 2022-11-05 | Discharge: 2022-11-05 | Disposition: A | Discharge status: Home / Self Care | Payer: Medicare Other | Source: Ambulatory Visit | Attending: Neurology | Admitting: Neurology

## 2022-11-05 DIAGNOSIS — R4182 Altered mental status, unspecified: Secondary | ICD-10-CM

## 2022-11-09 ENCOUNTER — Encounter: Payer: Self-pay | Admitting: Neurology

## 2022-11-17 ENCOUNTER — Encounter: Payer: Self-pay | Admitting: Neurology

## 2022-11-23 ENCOUNTER — Inpatient Hospital Stay (HOSPITAL_BASED_OUTPATIENT_CLINIC_OR_DEPARTMENT_OTHER): Payer: Medicare Other | Admitting: Hematology and Oncology

## 2022-11-23 ENCOUNTER — Inpatient Hospital Stay: Payer: Medicare Other | Attending: Hematology and Oncology

## 2022-11-23 VITALS — BP 141/68 | HR 86 | Temp 97.9°F | Resp 15 | Wt 151.2 lb

## 2022-11-23 DIAGNOSIS — D72820 Lymphocytosis (symptomatic): Secondary | ICD-10-CM

## 2022-11-23 DIAGNOSIS — D696 Thrombocytopenia, unspecified: Secondary | ICD-10-CM | POA: Diagnosis not present

## 2022-11-23 DIAGNOSIS — C9110 Chronic lymphocytic leukemia of B-cell type not having achieved remission: Secondary | ICD-10-CM | POA: Insufficient documentation

## 2022-11-23 DIAGNOSIS — N400 Enlarged prostate without lower urinary tract symptoms: Secondary | ICD-10-CM

## 2022-11-23 LAB — CBC WITH DIFFERENTIAL (CANCER CENTER ONLY)
Abs Immature Granulocytes: 0.05 10*3/uL (ref 0.00–0.07)
Basophils Absolute: 0 10*3/uL (ref 0.0–0.1)
Basophils Relative: 0 %
Eosinophils Absolute: 0.1 10*3/uL (ref 0.0–0.5)
Eosinophils Relative: 0 %
HCT: 33 % — ABNORMAL LOW (ref 39.0–52.0)
Hemoglobin: 10.5 g/dL — ABNORMAL LOW (ref 13.0–17.0)
Immature Granulocytes: 0 %
Lymphocytes Relative: 94 %
Lymphs Abs: 45.5 10*3/uL — ABNORMAL HIGH (ref 0.7–4.0)
MCH: 31.5 pg (ref 26.0–34.0)
MCHC: 31.8 g/dL (ref 30.0–36.0)
MCV: 99.1 fL (ref 80.0–100.0)
Monocytes Absolute: 0.4 10*3/uL (ref 0.1–1.0)
Monocytes Relative: 1 %
Neutro Abs: 2.4 10*3/uL (ref 1.7–7.7)
Neutrophils Relative %: 5 %
Platelet Count: 131 10*3/uL — ABNORMAL LOW (ref 150–400)
RBC: 3.33 MIL/uL — ABNORMAL LOW (ref 4.22–5.81)
RDW: 15.4 % (ref 11.5–15.5)
Smear Review: NORMAL
WBC Count: 48.4 10*3/uL — ABNORMAL HIGH (ref 4.0–10.5)
nRBC: 0 % (ref 0.0–0.2)

## 2022-11-23 LAB — CMP (CANCER CENTER ONLY)
ALT: <5 U/L (ref 0–44)
AST: 13 U/L — ABNORMAL LOW (ref 15–41)
Albumin: 3.5 g/dL (ref 3.5–5.0)
Alkaline Phosphatase: 116 U/L (ref 38–126)
Anion gap: 7 (ref 5–15)
BUN: 22 mg/dL (ref 8–23)
CO2: 28 mmol/L (ref 22–32)
Calcium: 8.8 mg/dL — ABNORMAL LOW (ref 8.9–10.3)
Chloride: 106 mmol/L (ref 98–111)
Creatinine: 0.92 mg/dL (ref 0.61–1.24)
GFR, Estimated: >60 mL/min (ref >60)
Glucose, Bld: 140 mg/dL — ABNORMAL HIGH (ref 70–99)
Potassium: 4.3 mmol/L (ref 3.5–5.1)
Sodium: 141 mmol/L (ref 135–145)
Total Bilirubin: 0.8 mg/dL (ref 0.3–1.2)
Total Protein: 6 g/dL — ABNORMAL LOW (ref 6.5–8.1)

## 2022-11-23 LAB — LACTATE DEHYDROGENASE: LDH: 102 U/L (ref 98–192)

## 2022-11-23 NOTE — Progress Notes (Signed)
[facility] [phone]   [phone]) [phone]  PROGRESS NOTE  Patient Care Team: [name], NP as PCP - General (Adult Health Nurse Practitioner) Edwin Cap, DPM as Consulting Physician (Podiatry) Ollen Gross, MD as Consulting Physician (Orthopedic Surgery)  Hematological/Oncological History # Chronic Lymphocytic Leukemia, Rai Stage I 1) 01/21/2021: WBC 39.8 (H), Hgb 14.2, Plt 131 (L)  2) 02/08/2021: Establish care with Georga Kaufmann PA-C. WBC: 46.7, Hgb 14.1, Plt 113K. ALC 45,900. Flow cytometry confirms a monoclonal B cell population consistent with CLL.  3) 02/18/2021: CLL prognostic panel results show normal cytogenetics, intermediate risk. IgVH positive. CT C/A/P showed enlarged retroperitoneal, portacaval, bilateral iliac, and pelvic sidewall lymph nodes, no evidence of splenomegaly.  05/26/2021: WBC 48.8, Hgb 13.3, MCV 95, Plt 90 08/24/2021: WBC 60.8, Hgb 13.6, MCV 94.5, Plt 108 11/24/2021: WBC 41.8, Hgb 12.9, MCV 91.8, Plt 122 05/24/2022: WBC 59.0, Hgb 13.7, MCV 95.2, Plt 101  Interval History:  Frederick Pham 72 y.o. male with medical history significant for newly diagnosed CLL who presents for a follow up visit. The patient's last visit was on 05/24/2022. In the interim since the last visit he was hospitalized from 10/13/2022 to 10/16/2022 with AKI due to obstructive uropathy and fall.  During that hospitalization he had a metabolic encephalopathy.  On exam today Frederick Pham reports that they are currently unable to remove his Foley as he is failing voiding trials.  He does have a increased prostate size.  He notes that he is doing well in rehab and even walking well for days.  He calls physical therapy "pain and torture".  He notes that he has had no other major changes in his health.  His weight has improved and he is up to 151 pounds after dropping down to 142 after his hospitalization.  He was also diagnosed with Parkinson's in the interim since her  last visit.  He also is not having any B symptoms such as fevers, chills, sweats, nausea, vomiting or diarrhea.  Overall he is stable at his baseline level of health.  The 10 point ROS is below.  MEDICAL HISTORY:  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   Thrombocytopenia (HCC) 02/08/2021    SURGICAL HISTORY: Past Surgical History:  Procedure Laterality Date   FRACTURE SURGERY     age 63   TONSILLECTOMY     age 3   TOTAL HIP ARTHROPLASTY Left 09/14/2021   Procedure: TOTAL HIP ARTHROPLASTY ANTERIOR APPROACH;  Surgeon: Ollen Gross, MD;  Location: [facility];  Service: Orthopedics;  Laterality: Left;   TOTAL KNEE ARTHROPLASTY Right 09/05/2021   Procedure: TOTAL KNEE ARTHROPLASTY;  Surgeon: Ollen Gross, MD;  Location: [facility];  Service: Orthopedics;  Laterality: Right;   WISDOM TOOTH EXTRACTION      SOCIAL HISTORY: Social History   Socioeconomic History   Marital status: Married    Spouse name: Not on file   Number of children: Not on file   Years of education: Not on file   Highest education level: Not on file  Occupational History   Not on file  Tobacco Use   Smoking status: Former    Packs/day: 1.00    Years: 10.00    Additional pack years: 0.00    Total pack years: 10.00    Types: Cigarettes    Quit date: 10/08/1990  Years since quitting: 32.1   Smokeless tobacco: Never  Vaping Use   Vaping Use: Never used  Substance and Sexual Activity   Alcohol use: Yes    Alcohol/week: 1.0 - 2.0 standard drink of alcohol    Types: 1 - 2 Standard drinks or equivalent per week    Comment: weekly   Drug use: Never   Sexual activity: Not on file  Other Topics Concern   Not on file  Social History Narrative   Diet:      Caffeine: Yes      Married, if yes what year: Yes, 1977      Do you live in a house, apartment, assisted living, condo, trailer,  ect: House      Is it one or more stories: 3      How many persons live in your home? 2      Pets: No      Highest level or education completed: 15      Current/Past profession: Art gallery manager, Teacher, English as a foreign language      Exercise: Yes                 Type and how often: weights- normally 5 days/week         Living Will: No   DNR: No   POA/HPOA: No      Functional Status:   Do you have difficulty bathing or dressing yourself? No   Do you have difficulty preparing food or eating? No   Do you have difficulty managing your medications? No   Do you have difficulty managing your finances? No   Do you have difficulty affording your medications? No   Right handed    Social Determinants of Health   Financial Resource Strain: Low Risk  (10/03/2022)   Overall Financial Resource Strain (CARDIA)    Difficulty of Paying Living Expenses: Not hard at all  Food Insecurity: No Food Insecurity (10/14/2022)   Hunger Vital Sign    Worried About Running Out of Food in the Last Year: Never true    Ran Out of Food in the Last Year: Never true  Transportation Needs: No Transportation Needs (10/14/2022)   PRAPARE - Administrator, Civil Service (Medical): No    Lack of Transportation (Non-Medical): No  Physical Activity: Unknown (10/03/2022)   Exercise Vital Sign    Days of Exercise per Week: 0 days    Minutes of Exercise per Session: Not on file  Stress: Patient Declined (10/03/2022)   [employer] of Occupational Health - Occupational Stress Questionnaire    Feeling of Stress : Patient declined  Social Connections: Unknown (10/03/2022)   Social Connection and Isolation Panel [NHANES]    Frequency of Communication with Friends and Family: Patient declined    Frequency of Social Gatherings with Friends and Family: Patient declined    Attends Religious Services: Patient declined    Database administrator or Organizations: No    Attends Engineer, structural: Not on file    Marital Status: Married   Catering manager Violence: Not At Risk (10/14/2022)   Humiliation, Afraid, Rape, and Kick questionnaire    Fear of Current or Ex-Partner: No    Emotionally Abused: No    Physically Abused: No    Sexually Abused: No    FAMILY HISTORY: Family History  Adopted: Yes    ALLERGIES:  is allergic to morphine.  MEDICATIONS:  Current Outpatient Medications  Medication Sig Dispense Refill   acetaminophen (TYLENOL) 325 MG  tablet Take 1-2 tablets (325-650 mg total) by mouth every 6 (six) hours as needed for mild pain (pain score 1-3 or temp > 100.5). 40 tablet 0   carbidopa-levodopa (SINEMET IR) 25-100 MG tablet Take 1.5 tablets by mouth 3 (three) times daily. 7am/11am/4pm 405 tablet 1   ibuprofen (ADVIL) 200 MG tablet Take 200 mg by mouth every 6 (six) hours as needed for mild pain.     levofloxacin (LEVAQUIN) 500 MG tablet Take 500 mg by mouth daily.     tamsulosin (FLOMAX) 0.4 MG CAPS capsule Take 1 capsule (0.4 mg total) by mouth daily. 30 capsule 1   No current facility-administered medications for this visit.    REVIEW OF SYSTEMS:   Constitutional: ( - ) fevers, ( - )  chills , ( - ) night sweats Eyes: ( - ) blurriness of vision, ( - ) double vision, ( - ) watery eyes Ears, nose, mouth, throat, and face: ( - ) mucositis, ( - ) sore throat Respiratory: ( - ) cough, ( - ) dyspnea, ( - ) wheezes Cardiovascular: ( - ) palpitation, ( - ) chest discomfort, ( - ) lower extremity swelling Gastrointestinal:  ( - ) nausea, ( - ) heartburn, ( - ) change in bowel habits Skin: ( - ) abnormal skin rashes Lymphatics: ( - ) new lymphadenopathy, ( - ) easy bruising Neurological: ( - ) numbness, ( - ) tingling, ( - ) new weaknesses Behavioral/Psych: ( - ) mood change, ( - ) new changes  All other systems were reviewed with the patient and are negative.  PHYSICAL EXAMINATION: ECOG PERFORMANCE STATUS: 0 - Asymptomatic  Vitals:   11/23/22 1410  BP: (!) 141/68  Pulse: 86  Resp: 15  Temp: 97.9 F  (36.6 C)  SpO2: 96%   Filed Weights   11/23/22 1410  Weight: 151 lb 3.2 oz (68.6 kg)    GENERAL: Well-appearing elderly Caucasian male, alert, no distress and comfortable SKIN: skin color, texture, turgor are normal, no rashes or significant lesions EYES: conjunctiva are pink and non-injected, sclera clear NECK: supple, non-tender LYMPH:  no palpable lymphadenopathy in the cervical, axillary or inguinal LUNGS: clear to auscultation and percussion with normal breathing effort HEART: regular rate & rhythm and no murmurs and no lower extremity edema Musculoskeletal: no cyanosis of digits and no clubbing  PSYCH: alert & oriented x 3, fluent speech NEURO: no focal motor/sensory deficits  LABORATORY DATA:  I have reviewed the data as listed    Latest Ref Rng & Units 11/23/2022    1:41 PM 10/16/2022    4:25 AM 10/15/2022    4:17 AM  CBC  WBC 4.0 - 10.5 K/uL 48.4  66.5  55.5   Hemoglobin 13.0 - 17.0 g/dL 16.1  09.6  9.7   Hematocrit 39.0 - 52.0 % 33.0  31.0  29.5   Platelets 150 - 400 K/uL 131  89  85        Latest Ref Rng & Units 11/23/2022    1:41 PM 10/16/2022    4:26 AM 10/15/2022    4:17 AM  CMP  Glucose 70 - 99 mg/dL 045  409  811   BUN 8 - 23 mg/dL 22  19  23    Creatinine 0.61 - 1.24 mg/dL 9.14  7.82  9.56   Sodium 135 - 145 mmol/L 141  139  139   Potassium 3.5 - 5.1 mmol/L 4.3  3.9  4.0   Chloride 98 - 111 mmol/L  106  105  107   CO2 22 - 32 mmol/L 28  26  25    Calcium 8.9 - 10.3 mg/dL 8.8  8.0  8.4   Total Protein 6.5 - 8.1 g/dL 6.0     Total Bilirubin 0.3 - 1.2 mg/dL 0.8     Alkaline Phos 38 - 126 U/L 116     AST 15 - 41 U/L 13     ALT 0 - 44 U/L <5       RADIOGRAPHIC STUDIES: MR BRAIN WO CONTRAST  Result Date: 11/13/2022 CLINICAL DATA:  Mental status change, unknown cause EXAM: MRI HEAD WITHOUT CONTRAST TECHNIQUE: Multiplanar, multiecho pulse sequences of the brain and surrounding structures were obtained without intravenous contrast. COMPARISON:  CT Head 10/13/22  FINDINGS: Brain: Negative for an acute infarct. No hydrocephalus. No hemorrhage. No extra-axial fluid collection. Scattered periventricular and subcortical T2/FLAIR hyperintense lesions are favored to represent sequela of chronic microvascular ischemic change. Advanced generalized volume loss without lobar predominance. Vascular: Normal flow voids. Skull and upper cervical spine: Normal marrow signal. Sinuses/Orbits: No middle ear or mastoid effusion. Paranasal sinuses are notable for mild mucosal thickening bilateral ethmoid sinuses. Orbits are unremarkable. Other: None. IMPRESSION: 1.  No acute intracranial process. 2.  Advanced generalized volume loss without lobar predominance. Electronically Signed   By: Lorenza Cambridge M.D.   On: 11/13/2022 09:48    ASSESSMENT & PLAN Frederick Pham 72 y.o. male with medical history significant for newly diagnosed CLL who presents for a follow up visit.   At this time the patient's findings are most consistent with a Rai stage I CLL.  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    # Chronic Lymphocytic Leukemia, Rai Stage I --patient is Stage I based on lymphocytosis and lymphadenopathy --prognostic panel shows normal cytogenetics, intermediate risk. 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  -- No indication to treat based on today's labs.  Leukocytosis is stable and patient has only mild cytopenias.Marland Kitchen  --Blood levels did drop while he was hospitalized but they appear to be normalizing.  This may be due to his kidney dysfunction and acute illness. -- Labs today show white blood cell count 48.4, Hgb 10.5, MCV 99.1, Plt 131. Cr 1.00 --recommend continued observation with a return clinic visit in 6 months time.  Interval 76-month labs.  Orders Placed This Encounter  Procedures   Prostate-Specific AG, Serum    Standing Status:   Future     Standing Expiration Date:   11/23/2023    All questions were answered. The patient knows to call the clinic with any problems, questions or concerns.  I have spent a total of 30 minutes minutes of face-to-face and non-face-to-face time, preparing to see the patient, performing a medically appropriate examination, counseling and educating the patient, ordering tests, documenting clinical information in the electronic health record, and care coordination.   Frederick Barns, MD Department of Hematology/Oncology [facility] Phone: [phone] Pager: [phone] Email: [email or web address]  11/24/2022 9:04 AM

## 2022-11-24 ENCOUNTER — Telehealth: Payer: Self-pay | Admitting: Hematology and Oncology

## 2022-11-27 ENCOUNTER — Ambulatory Visit: Payer: Medicare Other | Admitting: Podiatry

## 2022-12-04 ENCOUNTER — Ambulatory Visit: Payer: Medicare Other | Admitting: Neurology

## 2022-12-07 ENCOUNTER — Encounter: Payer: Self-pay | Admitting: Orthopedic Surgery

## 2022-12-07 ENCOUNTER — Ambulatory Visit (INDEPENDENT_AMBULATORY_CARE_PROVIDER_SITE_OTHER): Payer: Medicare Other | Admitting: Orthopedic Surgery

## 2022-12-07 VITALS — BP 140/66 | HR 85 | Temp 97.9°F | Resp 17 | Ht 67.0 in | Wt 151.0 lb

## 2022-12-07 DIAGNOSIS — G20B1 Parkinson's disease with dyskinesia, without mention of fluctuations: Secondary | ICD-10-CM | POA: Diagnosis not present

## 2022-12-07 DIAGNOSIS — M25552 Pain in left hip: Secondary | ICD-10-CM

## 2022-12-07 DIAGNOSIS — R2681 Unsteadiness on feet: Secondary | ICD-10-CM

## 2022-12-07 DIAGNOSIS — C9110 Chronic lymphocytic leukemia of B-cell type not having achieved remission: Secondary | ICD-10-CM

## 2022-12-07 DIAGNOSIS — R339 Retention of urine, unspecified: Secondary | ICD-10-CM

## 2022-12-07 MED ORDER — PREDNISONE 20 MG PO TABS
ORAL_TABLET | ORAL | 0 refills | Status: AC
Start: 2022-12-07 — End: 2022-12-14

## 2022-12-07 NOTE — Progress Notes (Signed)
Careteam: Patient Care Team: [name], NP as PCP - General (Adult Health Nurse Practitioner) Edwin Cap, DPM as Consulting Physician (Podiatry) Ollen Gross, MD as Consulting Physician (Orthopedic Surgery)  Seen by: Hazle Nordmann, AGNP-C  PLACE OF SERVICE:  [facility]  Advanced Directive information Does Patient Have a Medical Advance Directive?: Yes, Type of Advance Directive: Living will, Does patient want to make changes to medical advance directive?: No - Patient declined  Allergies  Allergen Reactions   Morphine Other (See Comments)    " Caused extreme confusion and combativeness "     Chief Complaint  Patient presents with   Medical Management of Chronic Issues    Patient states he is here for a follow up. Patient states he has had a fall and other series of issues. Patient states he can barely walk due to pain     HPI: Patient is a 72 y.o. male seen today foe medical management of chronic conditions.   Wife present during encounter.   Hospitalized 05/03-05/06 due to ARF/ urinary retention. He has been seeing urology outpatient. Unsuccessful voiding trials. Still has foley.   Followed by Dr. Lequita Halt due to ongoing left lower back and hip pain. No recent fall or injury. Reports he was just getting up when pain occurred. He has been taking tylenol and Tramadol without success. He has been less active since pain onset. He is worried about weakness. PT stopped services until pain has been investigated. He does not like taking medication. He is willing to try prednisone taper. He is scheduled for MRI next month.   Followed by Dr. Arbutus Leas for Parkinson's. Neurocognitive testing was recommended. He is not sure if he will go through with it. He is taking sinemet, but does not think it is working. At this time he is ambulating with wheelchair.    Review of Systems:  Review of Systems  Constitutional: Negative.   HENT: Negative.    Eyes: Negative.   Respiratory:  Negative.    Cardiovascular: Negative.   Gastrointestinal: Negative.   Genitourinary:  Negative for hematuria.  Musculoskeletal:  Positive for joint pain.  Skin: Negative.   Neurological:  Positive for weakness. Negative for dizziness and headaches.  Psychiatric/Behavioral:  Negative for depression. The patient is not nervous/anxious.     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   Thrombocytopenia (HCC) 02/08/2021   Past Surgical History:  Procedure Laterality Date   FRACTURE SURGERY     age 77   TONSILLECTOMY     age 45   TOTAL HIP ARTHROPLASTY Left 09/14/2021   Procedure: TOTAL HIP ARTHROPLASTY ANTERIOR APPROACH;  Surgeon: Ollen Gross, MD;  Location: [facility];  Service: Orthopedics;  Laterality: Left;   TOTAL KNEE ARTHROPLASTY Right 09/05/2021   Procedure: TOTAL KNEE ARTHROPLASTY;  Surgeon: Ollen Gross, MD;  Location: [facility];  Service: Orthopedics;  Laterality: Right;   WISDOM TOOTH EXTRACTION     Social History:   reports that he quit smoking about 32 years ago. His smoking use included cigarettes. He has a 10.00 pack-year smoking history. He has never used smokeless tobacco. He reports current alcohol use of about 1.0 - 2.0 standard drink of alcohol per week. He reports that he does not use drugs.  Family History  Adopted: Yes    Medications: Patient's Medications  New Prescriptions   No  medications on file  Previous Medications   ACETAMINOPHEN (TYLENOL) 325 MG TABLET    Take 1-2 tablets (325-650 mg total) by mouth every 6 (six) hours as needed for mild pain (pain score 1-3 or temp > 100.5).   CARBIDOPA-LEVODOPA (SINEMET IR) 25-100 MG TABLET    Take 1.5 tablets by mouth 3 (three) times daily. 7am/11am/4pm   IBUPROFEN (ADVIL) 200 MG TABLET    Take 200 mg by mouth every 6 (six) hours as needed for mild pain.   LEVOFLOXACIN (LEVAQUIN) 500 MG  TABLET    Take 500 mg by mouth daily.   TAMSULOSIN (FLOMAX) 0.4 MG CAPS CAPSULE    Take 1 capsule (0.4 mg total) by mouth daily.   TRAMADOL (ULTRAM) 50 MG TABLET    Take 50 mg by mouth 4 (four) times daily as needed for severe pain.  Modified Medications   No medications on file  Discontinued Medications   No medications on file    Physical Exam:  Vitals:   12/07/22 1033  BP: (!) 140/66  Pulse: 85  Resp: 17  Temp: 97.9 F (36.6 C)  TempSrc: Temporal  SpO2: 95%  Weight: 151 lb (68.5 kg)  Height: 5\' 7"  (1.702 m)   Body mass index is 23.65 kg/m. Wt Readings from Last 3 Encounters:  12/07/22 151 lb (68.5 kg)  11/23/22 151 lb 3.2 oz (68.6 kg)  10/30/22 142 lb (64.4 kg)    Physical Exam Vitals reviewed.  Constitutional:      General: He is not in acute distress. HENT:     Head: Normocephalic.  Eyes:     General:        Right eye: No discharge.        Left eye: No discharge.  Neck:     Vascular: No carotid bruit.  Cardiovascular:     Rate and Rhythm: Normal rate and regular rhythm.     Pulses: Normal pulses.     Heart sounds: Normal heart sounds.  Pulmonary:     Effort: Pulmonary effort is normal. No respiratory distress.     Breath sounds: Normal breath sounds. No wheezing.  Abdominal:     General: Bowel sounds are normal.     Palpations: Abdomen is soft.  Musculoskeletal:     Cervical back: Neck supple.     Right lower leg: No edema.     Left lower leg: No edema.  Skin:    General: Skin is warm.     Capillary Refill: Capillary refill takes less than 2 seconds.  Neurological:     General: No focal deficit present.     Mental Status: He is alert and oriented to person, place, and time.     Motor: Weakness present.     Gait: Gait abnormal.     Comments: wheelchair  Psychiatric:        Mood and Affect: Mood normal.     Labs reviewed: Basic Metabolic Panel: Recent Labs    10/15/22 0417 10/16/22 0425 10/16/22 0426 11/23/22 1341  NA 139  --  139 141   K 4.0  --  3.9 4.3  CL 107  --  105 106  CO2 25  --  26 28  GLUCOSE 130*  --  117* 140*  BUN 23  --  19 22  CREATININE 0.89  --  1.00 0.92  CALCIUM 8.4*  --  8.0* 8.8*  PHOS 2.1* 3.5 3.5  --    Liver Function Tests: Recent Labs  08/23/22 1113 10/13/22 1027 10/14/22 0305 10/15/22 0417 10/16/22 0426 11/23/22 1341  AST 19 10*  --   --   --  13*  ALT <5* 6  --   --   --  <5  ALKPHOS 74 183*  --   --   --  116  BILITOT 1.3* 1.3*  --   --   --  0.8  PROT 6.6 7.6  --   --   --  6.0*  ALBUMIN 4.5 4.2   < > 2.5* 2.6* 3.5   < > = values in this interval not displayed.   No results for input(s): "LIPASE", "AMYLASE" in the last 8760 hours. No results for input(s): "AMMONIA" in the last 8760 hours. CBC: Recent Labs    08/23/22 1113 10/13/22 1027 10/13/22 1033 10/15/22 0417 10/16/22 0425 11/23/22 1341  WBC 65.6* 145.9*   < > 55.5* 66.5* 48.4*  NEUTROABS 1.8 3.7  --   --   --  2.4  HGB 13.7 13.0   < > 9.7* 10.0* 10.5*  HCT 41.2 41.3   < > 29.5* 31.0* 33.0*  MCV 96.3 96.3   < > 98.3 98.1 99.1  PLT 97* 137*   < > 85* 89* 131*   < > = values in this interval not displayed.   Lipid Panel: No results for input(s): "CHOL", "HDL", "LDLCALC", "TRIG", "CHOLHDL", "LDLDIRECT" in the last 8760 hours. TSH: No results for input(s): "TSH" in the last 8760 hours. A1C: No results found for: "HGBA1C"   Assessment/Plan 1. Acute hip pain, left - followed by dr. Lequita Halt - h/o closed left hip fracture 09/2021 - unsuccessful trial tylenol and tramadol  - will try prednisone taper - MRI scheduled 12/2022 - predniSONE (DELTASONE) 20 MG tablet; Take 2 tablets (40 mg total) by mouth daily with breakfast for 2 days, THEN 1 tablet (20 mg total) daily with breakfast for 5 days.  Dispense: 9 tablet; Refill: 0  2. 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 Arbutus Leas - neurocognitive testing recommended> not sure he will do - on sinemet> does not think it  is helping - ambulating with wheelchair - PT stopped at this time due to left hip/ lower back pain - DME Wheelchair manual  3. CLL (chronic lymphocytic leukemia) (HCC) - followed by oncology - not on medication at this time   4. Unstable gait - see above  5. Urinary Retention - 05/03 hospitalized for ARF/ urinary retention - followed by urology outpatient - failed voiding trials  - continues to have indwelling foley  Total time: 36 minutes. Greater than 50% of total time spent doing patient education regarding health maintenance, hip/back pain, parkinson's, unstable gait including symptom/medication management.     Next appt: 05/31/2023  Hazle Nordmann, Juel Burrow  [facility] [phone]

## 2022-12-07 NOTE — Patient Instructions (Signed)
Recommend voltaren gel 1% 3-4 times daily for pain  Prednisone taper to help with low back/left hip pain

## 2022-12-08 ENCOUNTER — Telehealth: Payer: Self-pay

## 2022-12-08 NOTE — Telephone Encounter (Signed)
[name] called and left message on clinical intake voicemail stating that patient was seen in office on yesterday and given prednisone. She states that patient is doing fine,but she has noticed that his catheter bag has no urine only blood clots in it. She would like to know if maybe the catheter line may have been pulled or if it is the prednisone. Patient isn't having any pain.  Message sent to Hazle Nordmann, NP

## 2022-12-08 NOTE — Telephone Encounter (Signed)
Prednisone should not affect catheter. Suspect increased movement from traveling yesterday as reason for low urine output and clots. Recommend calling urology> he may need line flushed. Glad his pain has improved.

## 2022-12-13 ENCOUNTER — Other Ambulatory Visit: Payer: Self-pay

## 2022-12-13 DIAGNOSIS — M25552 Pain in left hip: Secondary | ICD-10-CM

## 2022-12-13 NOTE — Telephone Encounter (Signed)
[name] called and left message on clinical intake voicemail requesting another course of Prednisone. She stated that it seems to be working it just started off slow.  Medication pended and sent to Hazle Nordmann, NP for approval/refusal.

## 2022-12-15 NOTE — Telephone Encounter (Signed)
I called patient's wife and Amy [name]'s response. She verbalized her understanding and agreed.

## 2022-12-15 NOTE — Telephone Encounter (Signed)
Hazle Nordmann E, NP  to Me     12/13/22  1:12 PM I do not recommend refill at this time. Prolonged prednisone can cause adrenal crisis. Recommend discussing steroid injection with Dr. Lequita Halt for hip/ back pain.

## 2023-01-30 ENCOUNTER — Other Ambulatory Visit: Payer: Self-pay | Admitting: Orthopedic Surgery

## 2023-01-30 ENCOUNTER — Other Ambulatory Visit: Payer: Self-pay

## 2023-01-30 ENCOUNTER — Emergency Department (HOSPITAL_COMMUNITY): Payer: Medicare Other

## 2023-01-30 ENCOUNTER — Encounter (HOSPITAL_COMMUNITY): Payer: Self-pay

## 2023-01-30 ENCOUNTER — Inpatient Hospital Stay (HOSPITAL_COMMUNITY)
Admission: EM | Admit: 2023-01-30 | Discharge: 2023-02-01 | DRG: 522 | Disposition: A | Discharge status: Home / Self Care | Payer: Medicare Other | Attending: Internal Medicine | Admitting: Internal Medicine

## 2023-01-30 DIAGNOSIS — Z96651 Presence of right artificial knee joint: Secondary | ICD-10-CM | POA: Diagnosis present

## 2023-01-30 DIAGNOSIS — W19XXXA Unspecified fall, initial encounter: Principal | ICD-10-CM

## 2023-01-30 DIAGNOSIS — Z96642 Presence of left artificial hip joint: Secondary | ICD-10-CM | POA: Diagnosis present

## 2023-01-30 DIAGNOSIS — G20B1 Parkinson's disease with dyskinesia, without mention of fluctuations: Secondary | ICD-10-CM

## 2023-01-30 DIAGNOSIS — G20C Parkinsonism, unspecified: Secondary | ICD-10-CM | POA: Diagnosis present

## 2023-01-30 DIAGNOSIS — W010XXA Fall on same level from slipping, tripping and stumbling without subsequent striking against object, initial encounter: Secondary | ICD-10-CM | POA: Diagnosis not present

## 2023-01-30 DIAGNOSIS — S51011A Laceration without foreign body of right elbow, initial encounter: Secondary | ICD-10-CM | POA: Diagnosis present

## 2023-01-30 DIAGNOSIS — S72011A Unspecified intracapsular fracture of right femur, initial encounter for closed fracture: Secondary | ICD-10-CM | POA: Diagnosis present

## 2023-01-30 DIAGNOSIS — Z23 Encounter for immunization: Secondary | ICD-10-CM | POA: Diagnosis present

## 2023-01-30 DIAGNOSIS — D630 Anemia in neoplastic disease: Secondary | ICD-10-CM | POA: Diagnosis present

## 2023-01-30 DIAGNOSIS — Z87891 Personal history of nicotine dependence: Secondary | ICD-10-CM

## 2023-01-30 DIAGNOSIS — M25511 Pain in right shoulder: Secondary | ICD-10-CM | POA: Diagnosis present

## 2023-01-30 DIAGNOSIS — Z9181 History of falling: Secondary | ICD-10-CM | POA: Diagnosis not present

## 2023-01-30 DIAGNOSIS — Z885 Allergy status to narcotic agent status: Secondary | ICD-10-CM

## 2023-01-30 DIAGNOSIS — C9110 Chronic lymphocytic leukemia of B-cell type not having achieved remission: Secondary | ICD-10-CM | POA: Diagnosis present

## 2023-01-30 DIAGNOSIS — D696 Thrombocytopenia, unspecified: Secondary | ICD-10-CM | POA: Diagnosis present

## 2023-01-30 DIAGNOSIS — Z79899 Other long term (current) drug therapy: Secondary | ICD-10-CM

## 2023-01-30 DIAGNOSIS — G609 Hereditary and idiopathic neuropathy, unspecified: Secondary | ICD-10-CM | POA: Diagnosis present

## 2023-01-30 DIAGNOSIS — S72001A Fracture of unspecified part of neck of right femur, initial encounter for closed fracture: Secondary | ICD-10-CM

## 2023-01-30 DIAGNOSIS — S72041A Displaced fracture of base of neck of right femur, initial encounter for closed fracture: Secondary | ICD-10-CM | POA: Diagnosis not present

## 2023-01-30 DIAGNOSIS — M25561 Pain in right knee: Secondary | ICD-10-CM | POA: Diagnosis present

## 2023-01-30 DIAGNOSIS — N401 Enlarged prostate with lower urinary tract symptoms: Secondary | ICD-10-CM | POA: Diagnosis present

## 2023-01-30 DIAGNOSIS — G20A1 Parkinson's disease without dyskinesia, without mention of fluctuations: Secondary | ICD-10-CM | POA: Diagnosis present

## 2023-01-30 DIAGNOSIS — M1990 Unspecified osteoarthritis, unspecified site: Secondary | ICD-10-CM | POA: Diagnosis present

## 2023-01-30 DIAGNOSIS — R338 Other retention of urine: Secondary | ICD-10-CM | POA: Diagnosis present

## 2023-01-30 HISTORY — DX: Fracture of unspecified part of neck of right femur, initial encounter for closed fracture: S72.001A

## 2023-01-30 LAB — CBC WITH DIFFERENTIAL/PLATELET
Abs Immature Granulocytes: 0.03 10*3/uL (ref 0.00–0.07)
Basophils Absolute: 0.1 10*3/uL (ref 0.0–0.1)
Basophils Relative: 0 %
Eosinophils Absolute: 0 10*3/uL (ref 0.0–0.5)
Eosinophils Relative: 0 %
HCT: 38.4 % — ABNORMAL LOW (ref 39.0–52.0)
Hemoglobin: 12.6 g/dL — ABNORMAL LOW (ref 13.0–17.0)
Immature Granulocytes: 0 %
Lymphocytes Relative: 94 %
Lymphs Abs: 38.6 10*3/uL — ABNORMAL HIGH (ref 0.7–4.0)
MCH: 30.8 pg (ref 26.0–34.0)
MCHC: 32.8 g/dL (ref 30.0–36.0)
MCV: 93.9 fL (ref 80.0–100.0)
Monocytes Absolute: 0.1 10*3/uL (ref 0.1–1.0)
Monocytes Relative: 0 %
Neutro Abs: 2.3 10*3/uL (ref 1.7–7.7)
Neutrophils Relative %: 6 %
Platelets: 101 10*3/uL — ABNORMAL LOW (ref 150–400)
RBC: 4.09 MIL/uL — ABNORMAL LOW (ref 4.22–5.81)
RDW: 14.8 % (ref 11.5–15.5)
WBC: 41.2 10*3/uL — ABNORMAL HIGH (ref 4.0–10.5)
nRBC: 0 % (ref 0.0–0.2)

## 2023-01-30 LAB — COMPREHENSIVE METABOLIC PANEL
ALT: 8 U/L (ref 0–44)
AST: 21 U/L (ref 15–41)
Albumin: 3.9 g/dL (ref 3.5–5.0)
Alkaline Phosphatase: 85 U/L (ref 38–126)
Anion gap: 9 (ref 5–15)
BUN: 24 mg/dL — ABNORMAL HIGH (ref 8–23)
CO2: 26 mmol/L (ref 22–32)
Calcium: 9 mg/dL (ref 8.9–10.3)
Chloride: 104 mmol/L (ref 98–111)
Creatinine, Ser: 1.18 mg/dL (ref 0.61–1.24)
GFR, Estimated: >60 mL/min (ref >60)
Glucose, Bld: 120 mg/dL — ABNORMAL HIGH (ref 70–99)
Potassium: 4.6 mmol/L (ref 3.5–5.1)
Sodium: 139 mmol/L (ref 135–145)
Total Bilirubin: 1.1 mg/dL (ref 0.3–1.2)
Total Protein: 6.2 g/dL — ABNORMAL LOW (ref 6.5–8.1)

## 2023-01-30 LAB — MAGNESIUM: Magnesium: 2.2 mg/dL (ref 1.7–2.4)

## 2023-01-30 MED ORDER — CARBIDOPA-LEVODOPA 25-100 MG PO TABS
1.5000 | ORAL_TABLET | ORAL | Status: DC
  Administered 2023-01-30 – 2023-02-01 (×5): 1.5 via ORAL
  Filled 2023-01-30: qty 2
  Filled 2023-01-30: qty 1.5
  Filled 2023-01-30 (×3): qty 2
  Filled 2023-01-30: qty 1.5

## 2023-01-30 MED ORDER — TETANUS-DIPHTH-ACELL PERTUSSIS 5-2.5-18.5 LF-MCG/0.5 IM SUSY
0.5000 mL | PREFILLED_SYRINGE | Freq: Once | INTRAMUSCULAR | Status: AC
  Administered 2023-01-30: 0.5 mL via INTRAMUSCULAR
  Filled 2023-01-30: qty 0.5

## 2023-01-30 MED ORDER — TAMSULOSIN HCL 0.4 MG PO CAPS
0.4000 mg | ORAL_CAPSULE | Freq: Every day | ORAL | Status: DC
  Administered 2023-01-31 – 2023-02-01 (×2): 0.4 mg via ORAL
  Filled 2023-01-30 (×2): qty 1

## 2023-01-30 MED ORDER — CHLORHEXIDINE GLUCONATE 4 % EX SOLN
60.0000 mL | Freq: Once | CUTANEOUS | Status: AC
  Administered 2023-01-31: 4 via TOPICAL
  Filled 2023-01-30: qty 60

## 2023-01-30 MED ORDER — ACETAMINOPHEN 500 MG PO TABS
1000.0000 mg | ORAL_TABLET | Freq: Once | ORAL | Status: AC
  Administered 2023-01-30: 1000 mg via ORAL
  Filled 2023-01-30: qty 2

## 2023-01-30 MED ORDER — SODIUM CHLORIDE 0.9 % IV SOLN: INTRAVENOUS | Status: AC

## 2023-01-30 MED ORDER — POLYETHYLENE GLYCOL 3350 17 G PO PACK: 17.0000 g | PACK | Freq: Every day | ORAL | Status: DC | PRN

## 2023-01-30 MED ORDER — FENTANYL CITRATE PF 50 MCG/ML IJ SOSY
12.5000 ug | PREFILLED_SYRINGE | INTRAMUSCULAR | Status: DC | PRN
  Administered 2023-01-30: 50 ug via INTRAVENOUS
  Filled 2023-01-30: qty 1

## 2023-01-30 MED ORDER — OXYCODONE HCL 5 MG PO TABS
5.0000 mg | ORAL_TABLET | ORAL | Status: DC | PRN
  Administered 2023-01-30 – 2023-01-31 (×4): 5 mg via ORAL
  Filled 2023-01-30 (×5): qty 1

## 2023-01-30 MED ORDER — ENSURE PRE-SURGERY PO LIQD
296.0000 mL | Freq: Once | ORAL | Status: AC
  Administered 2023-01-31: 296 mL via ORAL
  Filled 2023-01-30: qty 296

## 2023-01-30 MED ORDER — ACETAMINOPHEN 325 MG PO TABS
650.0000 mg | ORAL_TABLET | Freq: Four times a day (QID) | ORAL | Status: DC | PRN
  Administered 2023-01-30: 650 mg via ORAL
  Filled 2023-01-30: qty 2

## 2023-01-30 MED ORDER — OXYCODONE HCL 5 MG PO TABS
5.0000 mg | ORAL_TABLET | Freq: Once | ORAL | Status: AC
  Administered 2023-01-30: 5 mg via ORAL
  Filled 2023-01-30: qty 1

## 2023-01-30 MED ORDER — CEFAZOLIN SODIUM-DEXTROSE 2-4 GM/100ML-% IV SOLN
2.0000 g | Freq: Once | INTRAVENOUS | Status: DC
  Filled 2023-01-30: qty 100

## 2023-01-30 NOTE — ED Provider Notes (Signed)
[place] EMERGENCY DEPARTMENT AT [facility] Provider Note   CSN: [id number] Arrival date & time: 01/30/23  1851     History  Chief Complaint  Patient presents with   Frederick Pham is a 72 y.o. male.   Fall  Patient resents after fall.  Medical history includes CLL, BPH, arthritis, neuropathy, parkinsonism.  Earlier today while going to doctor's office, patient had 2 mechanical falls.  He describes this as caused by tripping on the soles of his shoe, which are new and he is not used to wearing them.  He denies striking his head.  Since his falls, he has had pain in areas of right hip, knee, and ankle that is worse with weightbearing.  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  He sustained a skin tear on his right elbow.  He denies any arthralgias to this area but does state that he has soreness to his right shoulder.  He denies any other areas of discomfort.  He does believe that he is due for tetanus shot.     Home Medications Prior to Admission medications   Medication Sig Start Date End Date Taking? Authorizing Provider  acetaminophen (TYLENOL) 325 MG tablet Take 1-2 tablets (325-650 mg total) by mouth every 6 (six) hours as needed for mild pain (pain score 1-3 or temp > 100.5). 09/16/21   Edmisten, Kristie L, PA  carbidopa-levodopa (SINEMET IR) 25-100 MG tablet Take 1.5 tablets by mouth 3 (three) times daily. 7am/11am/4pm 10/30/22 10/25/23  Tat, Octaviano Batty, DO  ibuprofen (ADVIL) 200 MG tablet Take 200 mg by mouth every 6 (six) hours as needed for mild pain.    [provider]  tamsulosin (FLOMAX) 0.4 MG CAPS capsule Take 1 capsule (0.4 mg total) by mouth daily. 10/17/22   Burnadette Pop, MD  traMADol (ULTRAM) 50 MG tablet Take 50 mg by mouth 4 (four) times daily as needed for severe pain. 12/06/22   [provider]      Allergies    Morphine    Review of Systems   Review of Systems  Musculoskeletal:  Positive for  arthralgias.  Skin:  Positive for wound.  All other systems reviewed and are negative.   Physical Exam Updated Vital Signs BP (!) 165/69 (BP Location: Left Arm)   Pulse 65   Temp 98 F (36.7 C) (Oral)   Resp 18   Ht 5\' 7"  (1.702 m)   Wt 70 kg   SpO2 100%   BMI 24.17 kg/m  Physical Exam Vitals and nursing note reviewed.  Constitutional:      General: He is not in acute distress.    Appearance: Normal appearance. He is well-developed. He is not ill-appearing, toxic-appearing or diaphoretic.  HENT:     Head: Normocephalic and atraumatic.     Right Ear: External ear normal.     Left Ear: External ear normal.     Nose: Nose normal.     Mouth/Throat:     Mouth: Mucous membranes are moist.  Eyes:     Extraocular Movements: Extraocular movements intact.     Conjunctiva/sclera: Conjunctivae normal.  Cardiovascular:     Rate and Rhythm: Normal rate and regular rhythm.     Heart sounds: No murmur heard. Pulmonary:     Effort: Pulmonary effort is normal. No respiratory distress.  Chest:     Chest wall: No tenderness.  Abdominal:     General: There is no  distension.     Palpations: Abdomen is soft.     Tenderness: There is no abdominal tenderness.  Musculoskeletal:        General: Tenderness and signs of injury present.     Cervical back: Normal range of motion and neck supple.  Skin:    General: Skin is warm and dry.     Capillary Refill: Capillary refill takes less than 2 seconds.     Coloration: Skin is not jaundiced or pale.  Neurological:     General: No focal deficit present.     Mental Status: He is alert and oriented to person, place, and time.     Cranial Nerves: No cranial nerve deficit.     Sensory: No sensory deficit.     Motor: No weakness.     Coordination: Coordination normal.  Psychiatric:        Mood and Affect: Mood normal.        Behavior: Behavior normal.     ED Results / Procedures / Treatments   Labs (all labs ordered are listed, but only  abnormal results are displayed) Labs Reviewed  CBC WITH DIFFERENTIAL/PLATELET - Abnormal; Notable for the following components:      Result Value   WBC 41.2 (*)    RBC 4.09 (*)    Hemoglobin 12.6 (*)    HCT 38.4 (*)    Platelets 101 (*)    Lymphs Abs 38.6 (*)    All other components within normal limits  COMPREHENSIVE METABOLIC PANEL - Abnormal; Notable for the following components:   Glucose, Bld 120 (*)    BUN 24 (*)    Total Protein 6.2 (*)    All other components within normal limits  MAGNESIUM  CBC  BASIC METABOLIC PANEL    EKG EKG Interpretation Date/Time:  Tuesday January 30 2023 21:40:22 EDT Ventricular Rate:  77 PR Interval:  152 QRS Duration:  66 QT Interval:  346 QTC Calculation: 391 R Axis:   26  Text Interpretation: Normal sinus rhythm Confirmed by Gloris Manchester [phone]) on 01/30/2023 10:03:58 PM  Radiology DG Chest 1 View  Result Date: 01/30/2023 CLINICAL DATA:  Right shoulder pain after fall EXAM: CHEST  1 VIEW COMPARISON:  09/12/2021 FINDINGS: Stable cardiomediastinal silhouette. Aortic atherosclerotic calcification. No focal consolidation, pleural effusion, or pneumothorax. No displaced rib fractures. IMPRESSION: No acute cardiopulmonary disease. Electronically Signed   By: Minerva Fester M.D.   On: 01/30/2023 21:04   DG Knee 1-2 Views Right  Result Date: 01/30/2023 CLINICAL DATA:  Unable to move right leg after fall. History of knee replacement EXAM: RIGHT KNEE - 1-2 VIEW COMPARISON:  None Available. FINDINGS: No acute fracture or dislocation. Small effusion. Right TKA. No radiographic evidence of loosening. Soft tissues are unremarkable. IMPRESSION: No acute fracture or dislocation. Electronically Signed   By: Minerva Fester M.D.   On: 01/30/2023 21:03   DG Ankle Complete Right  Result Date: 01/30/2023 CLINICAL DATA:  Unable to move right leg after fall EXAM: RIGHT ANKLE - COMPLETE 3+ VIEW COMPARISON:  None Available. FINDINGS: There is no evidence of  fracture, dislocation, or joint effusion. Soft tissues are unremarkable. IMPRESSION: No acute fracture or dislocation. Electronically Signed   By: Minerva Fester M.D.   On: 01/30/2023 21:02   DG Hip Unilat W or Wo Pelvis 2-3 Views Right  Result Date: 01/30/2023 CLINICAL DATA:  Unable to move right leg after fall. EXAM: DG HIP (WITH OR WITHOUT PELVIS) 2-3V RIGHT COMPARISON:  Radiograph 09/14/2021 FINDINGS:  Acute displaced subcapital right femoral neck fracture. The distal fragment is displaced superiorly and laterally. The right femoral head remains located in the acetabulum. Left THA. IMPRESSION: Acute displaced subcapital right femoral neck fracture. Electronically Signed   By: Minerva Fester M.D.   On: 01/30/2023 21:01   DG Shoulder Right  Result Date: 01/30/2023 CLINICAL DATA:  Right shoulder pain after fall EXAM: RIGHT SHOULDER - 2+ VIEW COMPARISON:  None Available. FINDINGS: No acute fracture or dislocation. Age-related degenerative changes about the [facility] and glenohumeral joints. Soft tissues are unremarkable. IMPRESSION: No acute fracture or dislocation. Electronically Signed   By: Minerva Fester M.D.   On: 01/30/2023 21:00    Procedures Procedures    Medications Ordered in ED Medications  tamsulosin (FLOMAX) capsule 0.4 mg (has no administration in time range)  carbidopa-levodopa (SINEMET IR) 25-100 MG per tablet immediate release 1.5 tablet (has no administration in time range)  oxyCODONE (Oxy IR/ROXICODONE) immediate release tablet 5 mg (has no administration in time range)  acetaminophen (TYLENOL) tablet 650 mg (has no administration in time range)  fentaNYL (SUBLIMAZE) injection 12.5-50 mcg (has no administration in time range)  polyethylene glycol (MIRALAX / GLYCOLAX) packet 17 g (has no administration in time range)  0.9 %  sodium chloride infusion (has no administration in time range)  Tdap (BOOSTRIX) injection 0.5 mL (0.5 mLs Intramuscular Given 01/30/23 1951)  acetaminophen  (TYLENOL) tablet 1,000 mg (1,000 mg Oral Given 01/30/23 1950)  oxyCODONE (Oxy IR/ROXICODONE) immediate release tablet 5 mg (5 mg Oral Given 01/30/23 2119)    ED Course/ Medical Decision Making/ A&P                                 Medical Decision Making Amount and/or Complexity of Data Reviewed Labs: ordered. Radiology: ordered.  Risk OTC drugs. Prescription drug management. Decision regarding hospitalization.   This patient presents to the ED for concern of fall, this involves an extensive number of treatment options, and is a complaint that carries with it a high risk of complications and morbidity.  The differential diagnosis includes acute injuries   Co morbidities that complicate the patient evaluation  CLL, BPH, arthritis, neuropathy, parkinsonism   Additional history obtained:  Additional history obtained from patient's family External records from outside source obtained and reviewed including EMR   Lab Tests:  I Ordered, and personally interpreted labs.  The pertinent results include: Baseline leukocytosis, normal kidney function, normal electrolytes   Imaging Studies ordered:  I ordered imaging studies including x-ray of chest, right shoulder, right hip, knee, and ankle I independently visualized and interpreted imaging which showed acute displaced subcapital femoral neck fracture on right side I agree with the radiologist interpretation   Cardiac Monitoring: / EKG:  The patient was maintained on a cardiac monitor.  I personally viewed and interpreted the cardiac monitored which showed an underlying rhythm of: Sinus rhythm   Consultations Obtained:  I requested consultation with the orthopedic surgeon, Dr. Odis Hollingshead, and discussed lab and imaging findings as well as pertinent plan - they recommend: Due to Dr. Deri Fuelling unavailability tomorrow, reach out to on-call Ortho team for surgical planning I requested consultation with the surgeon, Dr. August Saucer,  and  discussed lab and imaging findings as well as pertinent plan - they recommend: Admission to medicine, n.p.o. at midnight, will see in consult.   Problem List / ED Course / Critical interventions / Medication management  Patient presents after  2 mechanical falls that occurred earlier today.  During his falls, he did hurt his right hip, knee, and ankle.  He sustained a skin tear to his right elbow and have some soreness to his right shoulder.  He adamantly denies striking his head.  On exam, he does not have evidence of scalp injury or swelling.  He has no spinal tenderness.  He has no chest or abdominal tenderness.  He has a superficial abrasion to his right elbow.  Will update tetanus today.  He has some soreness to his right shoulder but ROM is preserved.  Will obtain x-ray.  Will also obtain x-ray of right hip, knee, and ankle.  Patient declined any narcotic pain medication.  He simply requests Tylenol.  This was ordered.  On x-ray imaging, right hip fracture was identified.  Patient has previously Dr. Danie Binder for his orthopedic needs.  I reached out to [facility] and spoke with Dr. Odis Hollingshead.  He then intern reach out to Dr. Lequita Halt will not be available for surgery tomorrow.  Dr. Melanie Crazier recommends reaching out to on-call Ortho team.  I spoke with orthopedic doctor, Dr. August Saucer, who will see the patient in consult.  He recommends n.p.o. at midnight and admission to medicine.  Patient had ongoing pain after his x-rays and was given a dose of oxycodone.  He was admitted to medicine for further management. I ordered medication including Tylenol and oxycodone for analgesia; Tdap for tetanus prophylaxis Reevaluation of the patient after these medicines showed that the patient improved I have reviewed the patients home medicines and have made adjustments as needed   Social Determinants of Health:  Has access to outpatient care        Final Clinical Impression(s) / ED Diagnoses Final  diagnoses:  Fall, initial encounter  Closed displaced fracture of right femoral neck [facility])    Rx / DC Orders ED Discharge Orders     None         Gloris Manchester, MD 01/30/23 2208

## 2023-01-30 NOTE — ED Notes (Signed)
.ED TO INPATIENT HANDOFF REPORT  ED Nurse Name and Phone #: Lyndel Safe Name/Age/Gender Frederick Pham 72 y.o. male Room/Bed: WHALA/WHALA  Code Status   Code Status: Full Code  Home/SNF/Other Home Patient oriented to: self, place, time, and situation Is this baseline? Yes   Triage Complete: Triage complete  Chief Complaint Closed right hip fracture, initial encounter [facility]) [S72.001A]  Triage Note Patient presents to ER via [facility]. Patient fell twice at eye doctor today. Tripped over shoe both times. R arm laceration. Pt had right total knee replacement one year ago, complaining of pain to knee, difficulty putting weight on knee. A&Ox4, history of parkinsons. Denies hitting head, denies blood thinners.   Allergies Allergies  Allergen Reactions   Morphine Other (See Comments)    " Caused extreme confusion and combativeness "     Level of Care/Admitting Diagnosis ED Disposition     ED Disposition  Admit   Condition  --   Comment  Hospital Area: [facility] [[id number]]  Level of Care: Med-Surg [16]  May admit patient to [facility] or [facility] if equivalent level of care is available:: No  Covid Evaluation: Asymptomatic - no recent exposure (last 10 days) testing not required  Diagnosis: Closed right hip fracture, initial encounter [facility]) [[id number]]  Admitting Physician: Briscoe Deutscher [[id number]]  Attending Physician: Briscoe Deutscher [[id number]]  Certification:: I certify this patient will need inpatient services for at least 2 midnights  Expected Medical Readiness: 02/03/2023          B Medical/Surgery History 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   Thrombocytopenia (HCC) 02/08/2021   Past Surgical History:  Procedure Laterality Date   FRACTURE SURGERY     age 48   TONSILLECTOMY     age 57    TOTAL HIP ARTHROPLASTY Left 09/14/2021   Procedure: TOTAL HIP ARTHROPLASTY ANTERIOR APPROACH;  Surgeon: Ollen Gross, MD;  Location: [facility] ORS;  Service: Orthopedics;  Laterality: Left;   TOTAL KNEE ARTHROPLASTY Right 09/05/2021   Procedure: TOTAL KNEE ARTHROPLASTY;  Surgeon: Ollen Gross, MD;  Location: [facility] ORS;  Service: Orthopedics;  Laterality: Right;   WISDOM TOOTH EXTRACTION       A IV Location/Drains/Wounds Patient Lines/Drains/Airways Status     Active Line/Drains/Airways     Name Placement date Placement time Site Days   Peripheral IV 01/30/23 Anterior;Distal;Left;Upper Arm 01/30/23  2058  Arm  less than 1   Urethral Catheter A. Lewis Latex 16 Fr. 10/21/22  1426  Latex  101   Pressure Injury 10/13/22 Toe (Comment  which one) Anterior;Left Deep Tissue Pressure Injury - Purple or maroon localized area of discolored intact skin or blood-filled blister due to damage of underlying soft tissue from pressure and/or shear. 0.25 x 0.2 10/13/22  1355  -- 109            Intake/Output Last 24 hours No intake or output data in the 24 hours ending 01/30/23 2217  Labs/Imaging Results for orders placed or performed during the hospital encounter of 01/30/23 (from the past 48 hour(s))  CBC with Differential     Status: Abnormal   Collection Time: 01/30/23  8:56 PM  Result Value Ref Range   WBC 41.2 (H) 4.0 - 10.5 K/uL   RBC 4.09 (L) 4.22 - 5.81 MIL/uL   Hemoglobin 12.6 (L)  13.0 - 17.0 g/dL   HCT 81.1 (L) 91.4 - 78.2 %   MCV 93.9 80.0 - 100.0 fL   MCH 30.8 26.0 - 34.0 pg   MCHC 32.8 30.0 - 36.0 g/dL   RDW 95.6 21.3 - 08.6 %   Platelets 101 (L) 150 - 400 K/uL   nRBC 0.0 0.0 - 0.2 %   Neutrophils Relative % 6 %   Neutro Abs 2.3 1.7 - 7.7 K/uL   Lymphocytes Relative 94 %   Lymphs Abs 38.6 (H) 0.7 - 4.0 K/uL   Monocytes Relative 0 %   Monocytes Absolute 0.1 0.1 - 1.0 K/uL   Eosinophils Relative 0 %   Eosinophils Absolute 0.0 0.0 - 0.5 K/uL   Basophils Relative 0 %   Basophils  Absolute 0.1 0.0 - 0.1 K/uL   Immature Granulocytes 0 %   Abs Immature Granulocytes 0.03 0.00 - 0.07 K/uL   Smudge Cells PRESENT     Comment: Performed at [facility] [address] [place], [place] [address]  Comprehensive metabolic panel     Status: Abnormal   Collection Time: 01/30/23  8:56 PM  Result Value Ref Range   Sodium 139 135 - 145 mmol/L   Potassium 4.6 3.5 - 5.1 mmol/L   Chloride 104 98 - 111 mmol/L   CO2 26 22 - 32 mmol/L   Glucose, Bld 120 (H) 70 - 99 mg/dL    Comment: Glucose reference range applies only to samples taken after fasting for at least 8 hours.   BUN 24 (H) 8 - 23 mg/dL   Creatinine, Ser 9.62 0.61 - 1.24 mg/dL   Calcium 9.0 8.9 - 95.2 mg/dL   Total Protein 6.2 (L) 6.5 - 8.1 g/dL   Albumin 3.9 3.5 - 5.0 g/dL   AST 21 15 - 41 U/L   ALT 8 0 - 44 U/L   Alkaline Phosphatase 85 38 - 126 U/L   Total Bilirubin 1.1 0.3 - 1.2 mg/dL   GFR, Estimated >84 >13 mL/min    Comment: (NOTE) Calculated using the CKD-EPI Creatinine Equation (2021)    Anion gap 9 5 - 15    Comment: Performed at [facility], [address], [place], [place] [address]  Magnesium     Status: None   Collection Time: 01/30/23  8:56 PM  Result Value Ref Range   Magnesium 2.2 1.7 - 2.4 mg/dL    Comment: Performed at [facility], [address], [place], [place] [address]   DG Chest 1 View  Result Date: 01/30/2023 CLINICAL DATA:  Right shoulder pain after fall EXAM: CHEST  1 VIEW COMPARISON:  09/12/2021 FINDINGS: Stable cardiomediastinal silhouette. Aortic atherosclerotic calcification. No focal consolidation, pleural effusion, or pneumothorax. No displaced rib fractures. IMPRESSION: No acute cardiopulmonary disease. Electronically Signed   By: Minerva Fester M.D.   On: 01/30/2023 21:04   DG Knee 1-2 Views Right  Result Date: 01/30/2023 CLINICAL DATA:  Unable to move right leg after fall. History of knee replacement EXAM: RIGHT KNEE  - 1-2 VIEW COMPARISON:  None Available. FINDINGS: No acute fracture or dislocation. Small effusion. Right TKA. No radiographic evidence of loosening. Soft tissues are unremarkable. IMPRESSION: No acute fracture or dislocation. Electronically Signed   By: Minerva Fester M.D.   On: 01/30/2023 21:03   DG Ankle Complete Right  Result Date: 01/30/2023 CLINICAL DATA:  Unable to move right leg after fall EXAM: RIGHT ANKLE - COMPLETE 3+ VIEW COMPARISON:  None  Available. FINDINGS: There is no evidence of fracture, dislocation, or joint effusion. Soft tissues are unremarkable. IMPRESSION: No acute fracture or dislocation. Electronically Signed   By: Minerva Fester M.D.   On: 01/30/2023 21:02   DG Hip Unilat W or Wo Pelvis 2-3 Views Right  Result Date: 01/30/2023 CLINICAL DATA:  Unable to move right leg after fall. EXAM: DG HIP (WITH OR WITHOUT PELVIS) 2-3V RIGHT COMPARISON:  Radiograph 09/14/2021 FINDINGS: Acute displaced subcapital right femoral neck fracture. The distal fragment is displaced superiorly and laterally. The right femoral head remains located in the acetabulum. Left THA. IMPRESSION: Acute displaced subcapital right femoral neck fracture. Electronically Signed   By: Minerva Fester M.D.   On: 01/30/2023 21:01   DG Shoulder Right  Result Date: 01/30/2023 CLINICAL DATA:  Right shoulder pain after fall EXAM: RIGHT SHOULDER - 2+ VIEW COMPARISON:  None Available. FINDINGS: No acute fracture or dislocation. Age-related degenerative changes about the [facility] and glenohumeral joints. Soft tissues are unremarkable. IMPRESSION: No acute fracture or dislocation. Electronically Signed   By: Minerva Fester M.D.   On: 01/30/2023 21:00    Pending Labs Unresulted Labs (From admission, onward)     Start     Ordered   01/31/23 0500  CBC  Daily,   R      01/30/23 2124   01/31/23 0500  Basic metabolic panel  Daily,   R      01/30/23 2124            Vitals/Pain Today's Vitals   01/30/23 1905 01/30/23  1906  BP:  (!) 165/69  Pulse:  65  Resp:  18  Temp:  98 F (36.7 C)  TempSrc:  Oral  SpO2:  100%  Weight: 154 lb 5.2 oz (70 kg)   Height: 5\' 7"  (1.702 m)   PainSc: 10-Worst pain ever     Isolation Precautions No active isolations  Medications Medications  tamsulosin (FLOMAX) capsule 0.4 mg (has no administration in time range)  carbidopa-levodopa (SINEMET IR) 25-100 MG per tablet immediate release 1.5 tablet (has no administration in time range)  oxyCODONE (Oxy IR/ROXICODONE) immediate release tablet 5 mg (has no administration in time range)  acetaminophen (TYLENOL) tablet 650 mg (has no administration in time range)  fentaNYL (SUBLIMAZE) injection 12.5-50 mcg (has no administration in time range)  polyethylene glycol (MIRALAX / GLYCOLAX) packet 17 g (has no administration in time range)  0.9 %  sodium chloride infusion (has no administration in time range)  ceFAZolin (ANCEF) IVPB 2g/100 mL premix (has no administration in time range)  Tdap (BOOSTRIX) injection 0.5 mL (0.5 mLs Intramuscular Given 01/30/23 1951)  acetaminophen (TYLENOL) tablet 1,000 mg (1,000 mg Oral Given 01/30/23 1950)  oxyCODONE (Oxy IR/ROXICODONE) immediate release tablet 5 mg (5 mg Oral Given 01/30/23 2119)    Mobility non-ambulatory     Focused Assessments     R Recommendations: See Admitting Provider Note  Report given to:   Additional Notes:

## 2023-01-30 NOTE — H&P (Signed)
History and Physical    CHESNEY BERMUDEZ VOZ:[id number] DOB: 08/31/1950 DOA: 01/30/2023  PCP: [name], NP   Patient coming from: Home   Chief Complaint: Right leg pain and unable to bear weight after fall   HPI: KUSH MAULLER is a 72 y.o. male with medical history significant for Parkinsonism, CLL, BPH, and urinary retention with Foley catheter who presents with right leg pain and inability to bear weight after a trip and fall.   Patient reports that he was in his usual state and having an uneventful day when he tripped while trying to step up a curb on the way to a doctor's appointment.  He fell onto his right side and was experiencing pain in his right knee and right shoulder.  He was able to continue ambulating, but then fell again and has been unable to bear weight on the right leg after that.  He denies hitting his head or losing consciousness.  He denies any recent fever, chills, cough, shortness of breath, or chest pain.  ED Course: Upon arrival to the ED, patient is found to be afebrile and saturating well normal heart rate and stable blood pressure.  Plain films of the right hip demonstrate acute displaced subcapital right femoral neck fracture.  Chest x-ray is negative for acute cardiopulmonary disease.  CBC notable for stable leukocytosis, normocytic anemia, and thrombocytopenia.  CMP and EKG have been ordered but not yet resulted.  Orthopedic surgery (Dr. Odis Hollingshead) was consulted by the ED physician and the patient was treated with Tdap, acetaminophen, and oxycodone.  Review of Systems:  All other systems reviewed and apart from HPI, are negative.  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   Thrombocytopenia (HCC) 02/08/2021    Past Surgical History:  Procedure Laterality Date   FRACTURE SURGERY     age 68   TONSILLECTOMY     age 78    TOTAL HIP ARTHROPLASTY Left 09/14/2021   Procedure: TOTAL HIP ARTHROPLASTY ANTERIOR APPROACH;  Surgeon: Ollen Gross, MD;  Location: [facility];  Service: Orthopedics;  Laterality: Left;   TOTAL KNEE ARTHROPLASTY Right 09/05/2021   Procedure: TOTAL KNEE ARTHROPLASTY;  Surgeon: Ollen Gross, MD;  Location: [facility];  Service: Orthopedics;  Laterality: Right;   WISDOM TOOTH EXTRACTION      Social History:   reports that he quit smoking about 32 years ago. His smoking use included cigarettes. He started smoking about 42 years ago. He has a 10 pack-year smoking history. He has never used smokeless tobacco. He reports current alcohol use of about 1.0 - 2.0 standard drink of alcohol per week. He reports that he does not use drugs.  Allergies  Allergen Reactions   Morphine Other (See Comments)    " Caused extreme confusion and combativeness "     Family History  Adopted: Yes     Prior to Admission medications   Medication Sig Start Date End Date Taking? Authorizing Provider  acetaminophen (TYLENOL) 325 MG tablet Take 1-2 tablets (325-650 mg total) by mouth every 6 (six) hours as needed for mild pain (pain score 1-3 or temp > 100.5). 09/16/21   Edmisten, Kristie L, PA  carbidopa-levodopa (SINEMET IR) 25-100 MG tablet Take 1.5 tablets by mouth 3 (three) times daily. 7am/11am/4pm 10/30/22 10/25/23  Tat, Octaviano Batty, DO  ibuprofen (ADVIL) 200 MG tablet Take 200 mg by  mouth every 6 (six) hours as needed for mild pain.    [provider]  tamsulosin (FLOMAX) 0.4 MG CAPS capsule Take 1 capsule (0.4 mg total) by mouth daily. 10/17/22   Burnadette Pop, MD  traMADol (ULTRAM) 50 MG tablet Take 50 mg by mouth 4 (four) times daily as needed for severe pain. 12/06/22   [provider]    Physical Exam: Vitals:   01/30/23 1905 01/30/23 1906  BP:  (!) 165/69  Pulse:  65  Resp:  18  Temp:  98 F (36.7 C)  TempSrc:  Oral  SpO2:  100%  Weight: 70 kg   Height: 5\' 7"  (1.702 m)       Constitutional: NAD, no pallor or diaphoresis   Eyes: PERTLA, lids and conjunctivae normal ENMT: Mucous membranes are moist. Posterior pharynx clear of any exudate or lesions.   Neck: supple, no masses  Respiratory: no wheezing, no crackles. No accessory muscle use.  Cardiovascular: S1 & S2 heard, regular rate and rhythm. No extremity edema.  Abdomen: No distension, no tenderness, soft. Bowel sounds active.  Musculoskeletal: no clubbing / cyanosis. Right hip tender, neurovascularly intact distally.   Skin: no significant rashes, lesions, ulcers. Warm, dry, well-perfused. Neurologic: CN 2-12 grossly intact. Moving all extremities. Alert and oriented.  Psychiatric: Calm. Cooperative.    Labs and Imaging on Admission: I have personally reviewed following labs and imaging studies  CBC: Recent Labs  Lab 01/30/23 2056  WBC 41.2*  NEUTROABS PENDING  HGB 12.6*  HCT 38.4*  MCV 93.9  PLT 101*   Basic Metabolic Panel: No results for input(s): "NA", "K", "CL", "CO2", "GLUCOSE", "BUN", "CREATININE", "CALCIUM", "MG", "PHOS" in the last 168 hours. GFR: CrCl cannot be calculated (Patient's most recent lab result is older than the maximum 21 days allowed.). Liver Function Tests: No results for input(s): "AST", "ALT", "ALKPHOS", "BILITOT", "PROT", "ALBUMIN" in the last 168 hours. No results for input(s): "LIPASE", "AMYLASE" in the last 168 hours. No results for input(s): "AMMONIA" in the last 168 hours. Coagulation Profile: No results for input(s): "INR", "PROTIME" in the last 168 hours. Cardiac Enzymes: No results for input(s): "CKTOTAL", "CKMB", "CKMBINDEX", "TROPONINI" in the last 168 hours. BNP (last 3 results) No results for input(s): "PROBNP" in the last 8760 hours. HbA1C: No results for input(s): "HGBA1C" in the last 72 hours. CBG: No results for input(s): "GLUCAP" in the last 168 hours. Lipid Profile: No results for input(s): "CHOL", "HDL", "LDLCALC", "TRIG", "CHOLHDL",  "LDLDIRECT" in the last 72 hours. Thyroid Function Tests: No results for input(s): "TSH", "T4TOTAL", "FREET4", "T3FREE", "THYROIDAB" in the last 72 hours. Anemia Panel: No results for input(s): "VITAMINB12", "FOLATE", "FERRITIN", "TIBC", "IRON", "RETICCTPCT" in the last 72 hours. Urine analysis:    Component Value Date/Time   COLORURINE YELLOW 10/13/2022 1124   APPEARANCEUR CLEAR 10/13/2022 1124   LABSPEC 1.014 10/13/2022 1124   PHURINE 5.0 10/13/2022 1124   GLUCOSEU NEGATIVE 10/13/2022 1124   HGBUR SMALL (A) 10/13/2022 1124   BILIRUBINUR NEGATIVE 10/13/2022 1124   KETONESUR NEGATIVE 10/13/2022 1124   PROTEINUR NEGATIVE 10/13/2022 1124   NITRITE NEGATIVE 10/13/2022 1124   LEUKOCYTESUR NEGATIVE 10/13/2022 1124   Sepsis Labs: @LABRCNTIP (procalcitonin:4,lacticidven:4) )No results found for this or any previous visit (from the past 240 hour(s)).   Radiological Exams on Admission: DG Chest 1 View  Result Date: 01/30/2023 CLINICAL DATA:  Right shoulder pain after fall EXAM: CHEST  1 VIEW COMPARISON:  09/12/2021 FINDINGS: Stable cardiomediastinal silhouette. Aortic atherosclerotic calcification. No focal consolidation, pleural  effusion, or pneumothorax. No displaced rib fractures. IMPRESSION: No acute cardiopulmonary disease. Electronically Signed   By: Minerva Fester M.D.   On: 01/30/2023 21:04   DG Knee 1-2 Views Right  Result Date: 01/30/2023 CLINICAL DATA:  Unable to move right leg after fall. History of knee replacement EXAM: RIGHT KNEE - 1-2 VIEW COMPARISON:  None Available. FINDINGS: No acute fracture or dislocation. Small effusion. Right TKA. No radiographic evidence of loosening. Soft tissues are unremarkable. IMPRESSION: No acute fracture or dislocation. Electronically Signed   By: Minerva Fester M.D.   On: 01/30/2023 21:03   DG Ankle Complete Right  Result Date: 01/30/2023 CLINICAL DATA:  Unable to move right leg after fall EXAM: RIGHT ANKLE - COMPLETE 3+ VIEW COMPARISON:   None Available. FINDINGS: There is no evidence of fracture, dislocation, or joint effusion. Soft tissues are unremarkable. IMPRESSION: No acute fracture or dislocation. Electronically Signed   By: Minerva Fester M.D.   On: 01/30/2023 21:02   DG Hip Unilat W or Wo Pelvis 2-3 Views Right  Result Date: 01/30/2023 CLINICAL DATA:  Unable to move right leg after fall. EXAM: DG HIP (WITH OR WITHOUT PELVIS) 2-3V RIGHT COMPARISON:  Radiograph 09/14/2021 FINDINGS: Acute displaced subcapital right femoral neck fracture. The distal fragment is displaced superiorly and laterally. The right femoral head remains located in the acetabulum. Left THA. IMPRESSION: Acute displaced subcapital right femoral neck fracture. Electronically Signed   By: Minerva Fester M.D.   On: 01/30/2023 21:01   DG Shoulder Right  Result Date: 01/30/2023 CLINICAL DATA:  Right shoulder pain after fall EXAM: RIGHT SHOULDER - 2+ VIEW COMPARISON:  None Available. FINDINGS: No acute fracture or dislocation. Age-related degenerative changes about the [facility] and glenohumeral joints. Soft tissues are unremarkable. IMPRESSION: No acute fracture or dislocation. Electronically Signed   By: Minerva Fester M.D.   On: 01/30/2023 21:00    EKG: Independently reviewed. Sinus rhythm.   Assessment/Plan  1. Right hip fracture  - Based on the available data, Mr. Tio presents an estimated 0.7% risk of perioperative MI or cardiac arrest  - Keep NPO after midnight, continue pain-control, supportive care    2. CLL  - Followed by oncology, Dr. Leonides Schanz, and currently under observation only  - Counts appear stable on admission    3. Parkinson disease  - Continue Sinemet   4. BPH, urinary retention  - Continue Flomax    DVT prophylaxis: SCDs  Code Status: Full  Level of Care: Level of care: Med-Surg Family Communication: Wife at bedside   Disposition Plan:  Patient is from: home  Anticipated d/c is to: TBD Anticipated d/c date is: 02/03/23   Patient currently: Pending ortho consult and likely operative hip repair  Consults called: Orthopedic surgery  Admission status: inpatient     Briscoe Deutscher, MD [facility]  01/30/2023, 9:24 PM

## 2023-01-30 NOTE — ED Triage Notes (Signed)
Patient presents to ER via [facility]. Patient fell twice at eye doctor today. Tripped over shoe both times. R arm laceration. Pt had right total knee replacement one year ago, complaining of pain to knee, difficulty putting weight on knee. A&Ox4, history of parkinsons. Denies hitting head, denies blood thinners.

## 2023-01-30 NOTE — Progress Notes (Signed)
Right femoral neck fracture in an ambulatory patient [facility] not able to address this patient and they have requested the on-call physician to manage the fracture  Right displaced femoral neck fracture with plan for admission to the medical service.  N.p.o. after midnight.  Okay for IV fluids.  Hold anticoagulants.  Plan for surgery tomorrow which will be a hip replacement.  Will likely use same sizes as the left hip replacement done previously.  Full consult to follow

## 2023-01-31 ENCOUNTER — Encounter (HOSPITAL_COMMUNITY): Payer: Self-pay | Admitting: Family Medicine

## 2023-01-31 ENCOUNTER — Inpatient Hospital Stay (HOSPITAL_COMMUNITY): Payer: Medicare Other | Admitting: Certified Registered Nurse Anesthetist

## 2023-01-31 ENCOUNTER — Encounter (HOSPITAL_COMMUNITY)
Admission: EM | Disposition: A | Discharge status: Home / Self Care | Payer: Self-pay | Source: Home / Self Care | Attending: Internal Medicine

## 2023-01-31 ENCOUNTER — Inpatient Hospital Stay (HOSPITAL_COMMUNITY): Payer: Medicare Other

## 2023-01-31 ENCOUNTER — Other Ambulatory Visit: Payer: Self-pay

## 2023-01-31 DIAGNOSIS — S72001A Fracture of unspecified part of neck of right femur, initial encounter for closed fracture: Secondary | ICD-10-CM

## 2023-01-31 DIAGNOSIS — S72041A Displaced fracture of base of neck of right femur, initial encounter for closed fracture: Secondary | ICD-10-CM

## 2023-01-31 HISTORY — PX: TOTAL HIP ARTHROPLASTY: SHX124

## 2023-01-31 LAB — BASIC METABOLIC PANEL
Anion gap: 7 (ref 5–15)
BUN: 21 mg/dL (ref 8–23)
CO2: 25 mmol/L (ref 22–32)
Calcium: 8.7 mg/dL — ABNORMAL LOW (ref 8.9–10.3)
Chloride: 104 mmol/L (ref 98–111)
Creatinine, Ser: 1.12 mg/dL (ref 0.61–1.24)
GFR, Estimated: >60 mL/min (ref >60)
Glucose, Bld: 181 mg/dL — ABNORMAL HIGH (ref 70–99)
Potassium: 4.1 mmol/L (ref 3.5–5.1)
Sodium: 136 mmol/L (ref 135–145)

## 2023-01-31 LAB — CBC
HCT: 36.6 % — ABNORMAL LOW (ref 39.0–52.0)
Hemoglobin: 11.7 g/dL — ABNORMAL LOW (ref 13.0–17.0)
MCH: 30.2 pg (ref 26.0–34.0)
MCHC: 32 g/dL (ref 30.0–36.0)
MCV: 94.6 fL (ref 80.0–100.0)
Platelets: 125 10*3/uL — ABNORMAL LOW (ref 150–400)
RBC: 3.87 MIL/uL — ABNORMAL LOW (ref 4.22–5.81)
RDW: 14.7 % (ref 11.5–15.5)
WBC: 42.5 10*3/uL — ABNORMAL HIGH (ref 4.0–10.5)
nRBC: 0 % (ref 0.0–0.2)

## 2023-01-31 LAB — SURGICAL PCR SCREEN
MRSA, PCR: NEGATIVE
Staphylococcus aureus: NEGATIVE

## 2023-01-31 SURGERY — ARTHROPLASTY, HIP, TOTAL, ANTERIOR APPROACH
Anesthesia: Spinal | Site: Hip | Laterality: Right

## 2023-01-31 MED ORDER — PHENYLEPHRINE HCL-NACL 20-0.9 MG/250ML-% IV SOLN
INTRAVENOUS | Status: DC | PRN
  Administered 2023-01-31: 20 ug/min via INTRAVENOUS

## 2023-01-31 MED ORDER — PROPOFOL 500 MG/50ML IV EMUL
INTRAVENOUS | Status: DC | PRN
  Administered 2023-01-31: 50 ug/kg/min via INTRAVENOUS

## 2023-01-31 MED ORDER — BUPIVACAINE HCL (PF) 0.25 % IJ SOLN
INTRAMUSCULAR | Status: DC | PRN
  Administered 2023-01-31: 20 mL
  Administered 2023-01-31: 10 mL

## 2023-01-31 MED ORDER — SODIUM CHLORIDE 0.9 % IR SOLN
Status: DC | PRN
  Administered 2023-01-31: 1000 mL

## 2023-01-31 MED ORDER — CEFAZOLIN SODIUM-DEXTROSE 2-4 GM/100ML-% IV SOLN
2.0000 g | INTRAVENOUS | Status: AC
  Administered 2023-01-31: 2 g via INTRAVENOUS
  Filled 2023-01-31: qty 100

## 2023-01-31 MED ORDER — FENTANYL CITRATE (PF) 100 MCG/2ML IJ SOLN
INTRAMUSCULAR | Status: DC | PRN
  Administered 2023-01-31: 50 ug via INTRAVENOUS

## 2023-01-31 MED ORDER — DEXAMETHASONE SODIUM PHOSPHATE 10 MG/ML IJ SOLN
INTRAMUSCULAR | Status: AC
  Filled 2023-01-31: qty 1

## 2023-01-31 MED ORDER — POVIDONE-IODINE 10 % EX SWAB: 2.0000 | Freq: Once | CUTANEOUS | Status: DC

## 2023-01-31 MED ORDER — SODIUM CHLORIDE (PF) 0.9 % IJ SOLN
INTRAMUSCULAR | Status: DC | PRN
  Administered 2023-01-31: 20 mL

## 2023-01-31 MED ORDER — CLONIDINE HCL (ANALGESIA) 100 MCG/ML EP SOLN
EPIDURAL | Status: AC
  Filled 2023-01-31: qty 10

## 2023-01-31 MED ORDER — PROPOFOL 10 MG/ML IV BOLUS
INTRAVENOUS | Status: DC | PRN
  Administered 2023-01-31: 20 mg via INTRAVENOUS
  Administered 2023-01-31 (×3): 10 mg via INTRAVENOUS
  Administered 2023-01-31: 30 mg via INTRAVENOUS
  Administered 2023-01-31: 20 mg via INTRAVENOUS

## 2023-01-31 MED ORDER — BUPIVACAINE LIPOSOME 1.3 % IJ SUSP
INTRAMUSCULAR | Status: DC | PRN
  Administered 2023-01-31: 20 mL

## 2023-01-31 MED ORDER — ACETAMINOPHEN 10 MG/ML IV SOLN: 1000.0000 mg | Freq: Once | INTRAVENOUS | Status: DC | PRN

## 2023-01-31 MED ORDER — VANCOMYCIN HCL 1000 MG IV SOLR
INTRAVENOUS | Status: AC
  Filled 2023-01-31: qty 20

## 2023-01-31 MED ORDER — DROPERIDOL 2.5 MG/ML IJ SOLN: 0.6250 mg | Freq: Once | INTRAMUSCULAR | Status: DC | PRN

## 2023-01-31 MED ORDER — ONDANSETRON HCL 4 MG/2ML IJ SOLN
INTRAMUSCULAR | Status: AC
  Filled 2023-01-31: qty 2

## 2023-01-31 MED ORDER — FENTANYL CITRATE PF 50 MCG/ML IJ SOSY: 25.0000 ug | PREFILLED_SYRINGE | INTRAMUSCULAR | Status: DC | PRN

## 2023-01-31 MED ORDER — LACTATED RINGERS IV SOLN: INTRAVENOUS | Status: DC

## 2023-01-31 MED ORDER — DEXAMETHASONE SODIUM PHOSPHATE 10 MG/ML IJ SOLN
INTRAMUSCULAR | Status: DC | PRN
  Administered 2023-01-31: 8 mg via INTRAVENOUS

## 2023-01-31 MED ORDER — LIDOCAINE 2% (20 MG/ML) 5 ML SYRINGE
INTRAMUSCULAR | Status: DC | PRN
  Administered 2023-01-31: 40 mg via INTRAVENOUS

## 2023-01-31 MED ORDER — PHENYLEPHRINE 80 MCG/ML (10ML) SYRINGE FOR IV PUSH (FOR BLOOD PRESSURE SUPPORT)
PREFILLED_SYRINGE | INTRAVENOUS | Status: DC | PRN
  Administered 2023-01-31 (×2): 80 ug via INTRAVENOUS
  Administered 2023-01-31: 40 ug via INTRAVENOUS
  Administered 2023-01-31 (×2): 80 ug via INTRAVENOUS

## 2023-01-31 MED ORDER — POTASSIUM CHLORIDE IN NACL 20-0.9 MEQ/L-% IV SOLN
INTRAVENOUS | Status: DC
  Filled 2023-01-31 (×2): qty 1000

## 2023-01-31 MED ORDER — ORAL CARE MOUTH RINSE: 15.0000 mL | Freq: Once | OROMUCOSAL | Status: AC

## 2023-01-31 MED ORDER — PROPOFOL 10 MG/ML IV BOLUS
INTRAVENOUS | Status: AC
  Filled 2023-01-31: qty 20

## 2023-01-31 MED ORDER — ONDANSETRON HCL 4 MG/2ML IJ SOLN
INTRAMUSCULAR | Status: DC | PRN
Start: 2023-01-31 — End: 2023-01-31
  Administered 2023-01-31: 4 mg via INTRAVENOUS

## 2023-01-31 MED ORDER — STERILE WATER FOR IRRIGATION IR SOLN
Status: DC | PRN
Start: 2023-01-31 — End: 2023-01-31
  Administered 2023-01-31: 2000 mL

## 2023-01-31 MED ORDER — CLONIDINE HCL (ANALGESIA) 100 MCG/ML EP SOLN
EPIDURAL | Status: DC | PRN
Start: 2023-01-31 — End: 2023-01-31
  Administered 2023-01-31: 1 mL

## 2023-01-31 MED ORDER — LIDOCAINE HCL (PF) 2 % IJ SOLN
INTRAMUSCULAR | Status: AC
  Filled 2023-01-31: qty 5

## 2023-01-31 MED ORDER — BUPIVACAINE HCL (PF) 0.25 % IJ SOLN
INTRAMUSCULAR | Status: AC
  Filled 2023-01-31: qty 30

## 2023-01-31 MED ORDER — BUPIVACAINE IN DEXTROSE 0.75-8.25 % IT SOLN
INTRATHECAL | Status: DC | PRN
  Administered 2023-01-31: 1.6 mL via INTRATHECAL

## 2023-01-31 MED ORDER — VANCOMYCIN HCL 1000 MG IV SOLR
INTRAVENOUS | Status: DC | PRN
  Administered 2023-01-31: 1000 mg

## 2023-01-31 MED ORDER — FENTANYL CITRATE (PF) 100 MCG/2ML IJ SOLN
INTRAMUSCULAR | Status: AC
  Filled 2023-01-31: qty 2

## 2023-01-31 MED ORDER — BUPIVACAINE LIPOSOME 1.3 % IJ SUSP
INTRAMUSCULAR | Status: AC
  Filled 2023-01-31: qty 20

## 2023-01-31 MED ORDER — TRANEXAMIC ACID-NACL 1000-0.7 MG/100ML-% IV SOLN
1000.0000 mg | INTRAVENOUS | Status: AC
  Administered 2023-01-31: 1000 mg via INTRAVENOUS
  Filled 2023-01-31: qty 100

## 2023-01-31 MED ORDER — CHLORHEXIDINE GLUCONATE 0.12 % MT SOLN
15.0000 mL | Freq: Once | OROMUCOSAL | Status: AC
  Administered 2023-01-31: 15 mL via OROMUCOSAL

## 2023-01-31 MED ORDER — SODIUM CHLORIDE (PF) 0.9 % IJ SOLN
INTRAMUSCULAR | Status: AC
  Filled 2023-01-31: qty 50

## 2023-01-31 SURGICAL SUPPLY — 53 items
APL SKNCLS STERI-STRIP NONHPOA (GAUZE/BANDAGES/DRESSINGS)
BAG COUNTER SPONGE SURGICOUNT (BAG) IMPLANT
BAG DECANTER FOR FLEXI CONT (MISCELLANEOUS) ×1 IMPLANT
BAG SPEC THK2 15X12 ZIP CLS (MISCELLANEOUS)
BAG SPNG CNTER NS LX DISP (BAG)
BAG ZIPLOCK 12X15 (MISCELLANEOUS) IMPLANT
BALL HIP CERAMIC (Hips) IMPLANT
BENZOIN TINCTURE PRP APPL 2/3 (GAUZE/BANDAGES/DRESSINGS) IMPLANT
BLADE SAW SGTL 18X1.27X75 (BLADE) ×1 IMPLANT
BLADE SURG SZ10 CARB STEEL (BLADE) ×2 IMPLANT
CLSR STERI-STRIP ANTIMIC 1/2X4 (GAUZE/BANDAGES/DRESSINGS) IMPLANT
COVER PERINEAL POST (MISCELLANEOUS) ×1 IMPLANT
COVER SURGICAL LIGHT HANDLE (MISCELLANEOUS) ×1 IMPLANT
CUP SECTOR GRIPTON 50MM (Cup) IMPLANT
DRAPE C-ARM 42X120 X-RAY (DRAPES) ×1 IMPLANT
DRAPE FOOT SWITCH (DRAPES) ×1 IMPLANT
DRAPE POUCH INSTRU U-SHP 10X18 (DRAPES) ×1 IMPLANT
DRAPE STERI IOBAN 125X83 (DRAPES) ×1 IMPLANT
DRAPE U-SHAPE 47X51 STRL (DRAPES) ×3 IMPLANT
DRESSING AQUACEL AG SP 3.5X10 (GAUZE/BANDAGES/DRESSINGS) IMPLANT
DRSG AQUACEL AG ADV 3.5X10 (GAUZE/BANDAGES/DRESSINGS) ×1 IMPLANT
DRSG AQUACEL AG SP 3.5X10 (GAUZE/BANDAGES/DRESSINGS) ×1
DURAPREP 26ML APPLICATOR (WOUND CARE) ×1 IMPLANT
ELECT BLADE TIP CTD 4 INCH (ELECTRODE) ×2 IMPLANT
ELECT REM PT RETURN 15FT ADLT (MISCELLANEOUS) ×1 IMPLANT
FACESHIELD WRAPAROUND (MASK) ×4 IMPLANT
FACESHIELD WRAPAROUND OR TEAM (MASK) ×4 IMPLANT
GLOVE BIOGEL PI IND STRL 8 (GLOVE) ×2 IMPLANT
GLOVE ECLIPSE 8.0 STRL XLNG CF (GLOVE) ×1 IMPLANT
HIP BALL CERAMIC (Hips) ×1 IMPLANT
HOOD PEEL AWAY T7 (MISCELLANEOUS) ×2 IMPLANT
KIT BASIN OR (CUSTOM PROCEDURE TRAY) ×1 IMPLANT
KIT TURNOVER KIT A (KITS) IMPLANT
LINER ACET PNNCL PLUS4 NEUTRAL (Hips) IMPLANT
MARKER SKIN DUAL TIP RULER LAB (MISCELLANEOUS) ×1 IMPLANT
NDL HYPO 22X1.5 SAFETY MO (MISCELLANEOUS) ×1 IMPLANT
NEEDLE HYPO 22X1.5 SAFETY MO (MISCELLANEOUS) ×1 IMPLANT
PENCIL SMOKE EVACUATOR (MISCELLANEOUS) IMPLANT
PINNACLE PLUS 4 NEUTRAL (Hips) ×1 IMPLANT
SCREW 6.5MMX25MM (Screw) IMPLANT
STAPLER VISISTAT 35W (STAPLE) IMPLANT
STEM FEM ACTIS STD SZ7 (Nail) IMPLANT
STRIP CLOSURE SKIN 1/2X4 (GAUZE/BANDAGES/DRESSINGS) IMPLANT
SUT ETHIBOND NAB CT1 #1 30IN (SUTURE) ×3 IMPLANT
SUT MNCRL AB 4-0 PS2 18 (SUTURE) IMPLANT
SUT VIC AB 0 CT1 36 (SUTURE) ×1 IMPLANT
SUT VIC AB 1 CT1 36 (SUTURE) ×2 IMPLANT
SUT VIC AB 2-0 CT1 27 (SUTURE) ×2
SUT VIC AB 2-0 CT1 TAPERPNT 27 (SUTURE) ×2 IMPLANT
SYR 30ML LL (SYRINGE) ×2 IMPLANT
TOWEL OR 17X26 10 PK STRL BLUE (TOWEL DISPOSABLE) ×1 IMPLANT
TOWEL OR NON WOVEN STRL DISP B (DISPOSABLE) ×1 IMPLANT
TRAY FOLEY MTR SLVR 16FR STAT (SET/KITS/TRAYS/PACK) ×1 IMPLANT

## 2023-01-31 NOTE — Progress Notes (Signed)
[facility] Progress Note Patient: Frederick Pham ZOX:[id number] DOB: 1950-07-08 DOA: 01/30/2023  DOS: the patient was seen and examined on 01/31/2023  Brief hospital course: PMH of Parkinsonism, CLL, BPH, and urinary retention with Foley catheter who presents with right leg pain and inability to bear weight after a trip and fall.  Found to have right hip fracture. Will be undergoing ORIF on 8/21.  Assessment and Plan: Acute displaced subcapital right femoral neck fracture, closed Based on the available data, Mr. Nixdorf presents an estimated 0.7% risk of perioperative MI or cardiac arrest  Orthopedic consulted. Undergoing ORIF today. Postop weightbearing, pain control, DVT prophylaxis per orthopedic surgery.   CLL  Followed by oncology, Dr. Leonides Schanz, and currently under observation only  Counts appear stable on admission     Parkinson disease  Continue Sinemet    BPH, urinary retention  Continue Flomax  Has a Foley catheter.   Subjective: No nausea or vomiting.  Fall reported to be mechanical.  No fever no chills.  Physical Exam: General: in Mild distress, No Rash Cardiovascular: S1 and S2 Present, No Murmur Respiratory: Good respiratory effort, Bilateral Air entry present. No Crackles, No wheezes Abdomen: Bowel Sound present, No tenderness Extremities: No edema Neuro: Alert and oriented x3, no new focal deficit  Data Reviewed: I have Reviewed nursing notes, Vitals, and Lab results. Since last encounter, pertinent lab results CBC and BMP   . I have ordered test including CBC and BMP  .  Disposition: Status is: Inpatient Remains inpatient appropriate because: Awaiting postop recovery  SCDs Start: 01/30/23 2123   Family Communication: Wife at bedside Level of care: Med-Surg   Vitals:   01/31/23 0627 01/31/23 1011 01/31/23 1431 01/31/23 1635  BP: 131/68 (!) 144/69 139/65 (!) 155/68  Pulse: 71 75 67 75  Resp: 18 18 18 16   Temp: 98.6 F (37 C) 97.9 F (36.6 C)  98.4 F (36.9 C) 98.5 F (36.9 C)  TempSrc: Oral Oral Oral Oral  SpO2: 96% 97% 97% 97%  Weight:      Height:         Author: Lynden Oxford, MD 01/31/2023 5:21 PM  Please look on [email or web address] to find out who is on call.

## 2023-01-31 NOTE — Transfer of Care (Signed)
Immediate Anesthesia Transfer of Care Note  Patient: Zykee Loya Condon  Procedure(s) Performed: Procedure(s): TOTAL HIP ARTHROPLASTY ANTERIOR APPROACH (Right)  Patient Location: PACU  Anesthesia Type:MAC and Spinal  Level of Consciousness: Patient sedated, comfortable.   Airway & Oxygen Therapy: Patient spontaneously breathing, ventilating well, oxygen via simple oxygen mask.  Post-op Assessment: Report given to PACU RN, vital signs reviewed and stable.   Post vital signs: Reviewed and stable.  Complications: No apparent anesthesia complications  Last Vitals:  Vitals Value Taken Time  BP 87/51 01/31/23 2015  Temp    Pulse 59 01/31/23 2016  Resp 11 01/31/23 2016  SpO2 100 % 01/31/23 2016  Vitals shown include unfiled device data.  Last Pain:  Vitals:   01/31/23 1635  TempSrc: Oral  PainSc: 3       Patients Stated Pain Goal: 2 (01/31/23 0800)  Complications: No notable events documented.

## 2023-01-31 NOTE — Brief Op Note (Signed)
   01/31/2023  8:21 PM  PATIENT:  Frederick Pham  72 y.o. male  PRE-OPERATIVE DIAGNOSIS:  RIGHT HIP FRACTURE  POST-OPERATIVE DIAGNOSIS:  RIGHT HIP FRACTURE  PROCEDURE:  Procedure(s): TOTAL HIP ARTHROPLASTY ANTERIOR APPROACH  SURGEON:  Surgeon(s): Cammy Copa, MD  ASSISTANT: magnant pa  ANESTHESIA:   spinal  EBL: 300 ml    Total I/O In: 200 [I.V.:200] Out: 500 [Urine:500]  BLOOD ADMINISTERED: none  DRAINS: none   LOCAL MEDICATIONS USED:  exparel clonidine marcaine  SPECIMEN:  No Specimen  COUNTS:  YES  TOURNIQUET:  * No tourniquets in log *  DICTATION: .Other Dictation: Dictation Number [id number]  PLAN OF CARE: Admit for overnight observation  PATIENT DISPOSITION:  PACU - hemodynamically stable

## 2023-01-31 NOTE — Hospital Course (Signed)
Brief hospital course: PMH of Parkinsonism, CLL, BPH, and urinary retention with Foley catheter who presents with right leg pain and inability to bear weight after a trip and fall.  Found to have right hip fracture. Will be undergoing ORIF on 8/21.  Assessment and Plan: Acute displaced subcapital right femoral neck fracture, closed Based on the available data, Mr. Huesman presents an estimated 0.7% risk of perioperative MI or cardiac arrest  Orthopedic consulted. Undergoing ORIF today. Postop weightbearing, pain control, DVT prophylaxis per orthopedic surgery.   CLL  Followed by oncology, Dr. Leonides Schanz, and currently under observation only  Counts appear stable on admission     Parkinson disease  Continue Sinemet    BPH, urinary retention  Continue Flomax  Has a Foley catheter.

## 2023-01-31 NOTE — Op Note (Signed)
NAME: Frederick Pham, Frederick Pham MEDICAL RECORD NO: [id number] ACCOUNT NO: [id number] DATE OF BIRTH: [birth year] FACILITY: [facility] LOCATION: [id number] PHYSICIAN: Graylin Shiver. August Saucer, MD  Operative Report   PREOPERATIVE DIAGNOSIS:  Right hip femoral neck fracture.  POSTOPERATIVE DIAGNOSIS:  Right hip femoral neck fracture.  PROCEDURE:  Right total hip replacement using ACTIS components press fit stem size 7 with 32 mm +5 ceramic head and 50 mm Pinnacle cup with +4 liner offset 1 screw.  SURGEON ATTENDING:  Graylin Shiver. August Saucer, MD  ASSISTANT:  Karenann Cai.  INDICATIONS:  The patient is a 72 year old patient with right hip fracture following a fall.  Presents for operative management after explanation of risks and benefits.  DESCRIPTION OF PROCEDURE:  The patient was brought to the operating room where spinal anesthetic was induced.  Preoperative antibiotics administered.  Timeout was called.  The patient was placed on the Hana bed  The right leg and hip region were prescrubbed with alcohol and Betadine, allowed to air dry, prepped with DuraPrep solution and draped in sterile manner.  Collier Flowers was used to cover the operative field.  After calling timeout, incision made about 1 cm  inferior and 2 cm distal to the anterior superior iliac crest, a total length of the incision was approximately 10 cm.  Skin and subcutaneous tissue sharply divided.  Fascia lata was encountered.  Crossing branches of the lateral femoral cutaneous nerve  were swept proximally.  The fascia lata was incised in line with the fibers up to the anterior superior iliac crest.  Plane was developed between the tensor fascia lata and the rectus.  Crossing vessels were cauterized which were the circumflex vessels.   Next, retractor placed on the superior femoral neck and inferior femoral neck.  Capsule was visualized.  A T-shaped capsulotomy was made and tagged with a tagging suture.  Dissection of that capsule was performed around anteriorly as well as   posteriorly.  Retractors were placed inside the capsular reflections.  Femoral neck cut was then made approximately 1.5 cm from the break in accordance with preoperative templating. Head was removed.  Bone quality was marginal.  At this time, the  pulvinar was released and a Weitlaner retractor was placed.  Reaming was then started with a 47 mm reamer, went up to 49 mm with good bony bleeding present.  Reamed in approximately 40 degrees of abduction and 15-20 of anteversion.  Cup was placed with  very good press fit obtained.  Due to the patient's history of falls, we did put one screw into the cup, +4 liner was placed and attention was then directed towards the femur.  Femoral lift was placed.  Capsule was dissected off of the conjoined tendon.   Partial release was performed for visualization and exposure.  The leg was externally rotated and then taken into adduction.  Trochanteric retractor and a Mueller retractor were placed for exposure.  The box cutter was used to lateralize the starting  point.  Broaching then performed parallel to the patient's native version up to a size 7.  Size 7 was very stable.  Coplaning was then performed.  Next +1 and +5 ball were placed and the +5 neck gave very good stability with nearly equal leg lengths.   The patient had good stability with external rotation of 45 degrees and extension of about 70 degrees.  Good stability posteriorly as well.  Trial component removed and the true stem was placed along with the +5 ceramic head, which gave very good  stability.  Thorough irrigation was performed.  It should be noted that we placed Irrisept, which was then taken out and then placed some vancomycin powder in the femur, and then thorough irrigation with 3 liters of pulsatile irrigation was performed.   We then placed Irrisept around the implant, which was then removed and then placed vancomycin powder around the implant. Capsule was closed using #1 Vicryl suture followed by  interrupted inverted #1 Vicryl suture to close the fascia lata.  Next, we  closed with 0 Vicryl suture, 2-0 Vicryl suture, and 3-0 Monocryl.  Steri-Strips and Aquacel dressing applied.  The patient tolerated the procedure well without immediate complications, transferred to recovery room in stable condition.  Luke's assistance  was required at all times for retraction, opening, closing, mobilization of tissue, placement of implants.  His assistance was a medical necessity.      PAA D: 01/31/2023 8:28:10 pm T: 01/31/2023 9:32:00 pm  JOB: [id number]

## 2023-01-31 NOTE — Consult Note (Signed)
Reason for Consult: Right hip pain Referring Physician: [name] is an 72 y.o. male.  HPI: Patient describes a mechanical fall yesterday.  Fell on his right hip.  Emergency department demonstrates displaced subcapital femoral neck fracture.  Patient had similar injury a year ago.  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  Denies any family or personal history of pulmonary embolism or DVT.  Denies any other orthopedic complaints.  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   Thrombocytopenia (HCC) 02/08/2021    Past Surgical History:  Procedure Laterality Date   FRACTURE SURGERY     age 20   TONSILLECTOMY     age 66   TOTAL HIP ARTHROPLASTY Left 09/14/2021   Procedure: TOTAL HIP ARTHROPLASTY ANTERIOR APPROACH;  Surgeon: Ollen Gross, MD;  Location: [facility];  Service: Orthopedics;  Laterality: Left;   TOTAL KNEE ARTHROPLASTY Right 09/05/2021   Procedure: TOTAL KNEE ARTHROPLASTY;  Surgeon: Ollen Gross, MD;  Location: [facility];  Service: Orthopedics;  Laterality: Right;   WISDOM TOOTH EXTRACTION      Family History  Adopted: Yes    Social History:  reports that he quit smoking about 32 years ago. His smoking use included cigarettes. He started smoking about 42 years ago. He has a 10 pack-year smoking history. He has never used smokeless tobacco. He reports current alcohol use of about 1.0 - 2.0 standard drink of alcohol per week. He reports that he does not use drugs.  Allergies:  Allergies  Allergen Reactions   Morphine Other (See Comments)    " Caused extreme confusion and combativeness "     Medications: I have reviewed the patient's current medications.  Results for orders placed or performed during the hospital encounter of 01/30/23 (from the past 48 hour(s))  CBC with Differential     Status: Abnormal   Collection Time:  01/30/23  8:56 PM  Result Value Ref Range   WBC 41.2 (H) 4.0 - 10.5 K/uL   RBC 4.09 (L) 4.22 - 5.81 MIL/uL   Hemoglobin 12.6 (L) 13.0 - 17.0 g/dL   HCT 40.9 (L) 81.1 - 91.4 %   MCV 93.9 80.0 - 100.0 fL   MCH 30.8 26.0 - 34.0 pg   MCHC 32.8 30.0 - 36.0 g/dL   RDW 78.2 95.6 - 21.3 %   Platelets 101 (L) 150 - 400 K/uL   nRBC 0.0 0.0 - 0.2 %   Neutrophils Relative % 6 %   Neutro Abs 2.3 1.7 - 7.7 K/uL   Lymphocytes Relative 94 %   Lymphs Abs 38.6 (H) 0.7 - 4.0 K/uL   Monocytes Relative 0 %   Monocytes Absolute 0.1 0.1 - 1.0 K/uL   Eosinophils Relative 0 %   Eosinophils Absolute 0.0 0.0 - 0.5 K/uL   Basophils Relative 0 %   Basophils Absolute 0.1 0.0 - 0.1 K/uL   Immature Granulocytes 0 %   Abs Immature Granulocytes 0.03 0.00 - 0.07 K/uL   Smudge Cells PRESENT     Comment: Performed at [facility], [address] [place], [place] [address]  Comprehensive metabolic panel     Status: Abnormal   Collection Time: 01/30/23  8:56 PM  Result Value Ref Range   Sodium 139 135 - 145 mmol/L   Potassium 4.6 3.5 - 5.1 mmol/L   Chloride 104  98 - 111 mmol/L   CO2 26 22 - 32 mmol/L   Glucose, Bld 120 (H) 70 - 99 mg/dL    Comment: Glucose reference range applies only to samples taken after fasting for at least 8 hours.   BUN 24 (H) 8 - 23 mg/dL   Creatinine, Ser 7.82 0.61 - 1.24 mg/dL   Calcium 9.0 8.9 - 42.3 mg/dL   Total Protein 6.2 (L) 6.5 - 8.1 g/dL   Albumin 3.9 3.5 - 5.0 g/dL   AST 21 15 - 41 U/L   ALT 8 0 - 44 U/L   Alkaline Phosphatase 85 38 - 126 U/L   Total Bilirubin 1.1 0.3 - 1.2 mg/dL   GFR, Estimated >53 >61 mL/min    Comment: (NOTE) Calculated using the CKD-EPI Creatinine Equation (2021)    Anion gap 9 5 - 15    Comment: Performed at [facility], [address], [place], [place] [address]  Magnesium     Status: None   Collection Time: 01/30/23  8:56 PM  Result Value Ref Range   Magnesium 2.2 1.7 - 2.4 mg/dL    Comment: Performed  at [facility], [address], [place], [place] [address]  Surgical pcr screen     Status: None   Collection Time: 01/30/23 11:35 PM   Specimen: Nasal Mucosa; Nasal Swab  Result Value Ref Range   MRSA, PCR NEGATIVE NEGATIVE   Staphylococcus aureus NEGATIVE NEGATIVE    Comment: (NOTE) The Xpert SA Assay (FDA approved for NASAL specimens in patients 29 years of age and older), is one component of a comprehensive surveillance program. It is not intended to diagnose infection nor to guide or monitor treatment. Performed at [facility], [address], [place], [place] [address]   CBC     Status: Abnormal   Collection Time: 01/31/23  4:38 AM  Result Value Ref Range   WBC 42.5 (H) 4.0 - 10.5 K/uL   RBC 3.87 (L) 4.22 - 5.81 MIL/uL   Hemoglobin 11.7 (L) 13.0 - 17.0 g/dL   HCT 09.3 (L) 26.7 - 12.4 %   MCV 94.6 80.0 - 100.0 fL   MCH 30.2 26.0 - 34.0 pg   MCHC 32.0 30.0 - 36.0 g/dL   RDW 58.0 99.8 - 33.8 %   Platelets 125 (L) 150 - 400 K/uL    Comment: Immature Platelet Fraction may be clinically indicated, consider ordering this additional test [id number]    nRBC 0.0 0.0 - 0.2 %    Comment: Performed at [facility], [address], [place], [place] [address]  Basic metabolic panel     Status: Abnormal   Collection Time: 01/31/23  4:38 AM  Result Value Ref Range   Sodium 136 135 - 145 mmol/L   Potassium 4.1 3.5 - 5.1 mmol/L   Chloride 104 98 - 111 mmol/L   CO2 25 22 - 32 mmol/L   Glucose, Bld 181 (H) 70 - 99 mg/dL    Comment: Glucose reference range applies only to samples taken after fasting for at least 8 hours.   BUN 21 8 - 23 mg/dL   Creatinine, Ser 3.79 0.61 - 1.24 mg/dL   Calcium 8.7 (L) 8.9 - 10.3 mg/dL   GFR, Estimated >02 >40 mL/min    Comment: (NOTE) Calculated using the CKD-EPI Creatinine Equation (2021)    Anion gap 7 5 - 15    Comment: Performed at [facility], [address],  [place], [place] [address]    DG Chest 1 View  Result Date: 01/30/2023 CLINICAL DATA:  Right shoulder pain after fall EXAM: CHEST  1 VIEW COMPARISON:  09/12/2021 FINDINGS: Stable cardiomediastinal silhouette. Aortic atherosclerotic calcification. No focal consolidation, pleural effusion, or pneumothorax. No displaced rib fractures. IMPRESSION: No acute cardiopulmonary disease. Electronically Signed   By: Minerva Fester M.D.   On: 01/30/2023 21:04   DG Knee 1-2 Views Right  Result Date: 01/30/2023 CLINICAL DATA:  Unable to move right leg after fall. History of knee replacement EXAM: RIGHT KNEE - 1-2 VIEW COMPARISON:  None Available. FINDINGS: No acute fracture or dislocation. Small effusion. Right TKA. No radiographic evidence of loosening. Soft tissues are unremarkable. IMPRESSION: No acute fracture or dislocation. Electronically Signed   By: Minerva Fester M.D.   On: 01/30/2023 21:03   DG Ankle Complete Right  Result Date: 01/30/2023 CLINICAL DATA:  Unable to move right leg after fall EXAM: RIGHT ANKLE - COMPLETE 3+ VIEW COMPARISON:  None Available. FINDINGS: There is no evidence of fracture, dislocation, or joint effusion. Soft tissues are unremarkable. IMPRESSION: No acute fracture or dislocation. Electronically Signed   By: Minerva Fester M.D.   On: 01/30/2023 21:02   DG Hip Unilat W or Wo Pelvis 2-3 Views Right  Result Date: 01/30/2023 CLINICAL DATA:  Unable to move right leg after fall. EXAM: DG HIP (WITH OR WITHOUT PELVIS) 2-3V RIGHT COMPARISON:  Radiograph 09/14/2021 FINDINGS: Acute displaced subcapital right femoral neck fracture. The distal fragment is displaced superiorly and laterally. The right femoral head remains located in the acetabulum. Left THA. IMPRESSION: Acute displaced subcapital right femoral neck fracture. Electronically Signed   By: Minerva Fester M.D.   On: 01/30/2023 21:01   DG Shoulder Right  Result Date: 01/30/2023 CLINICAL DATA:  Right shoulder pain after fall EXAM:  RIGHT SHOULDER - 2+ VIEW COMPARISON:  None Available. FINDINGS: No acute fracture or dislocation. Age-related degenerative changes about the [facility] and glenohumeral joints. Soft tissues are unremarkable. IMPRESSION: No acute fracture or dislocation. Electronically Signed   By: Minerva Fester M.D.   On: 01/30/2023 21:00    Review of Systems  Musculoskeletal:  Positive for arthralgias.  All other systems reviewed and are negative.  Blood pressure (!) 155/68, pulse 75, temperature 98.5 F (36.9 C), temperature source Oral, resp. rate 16, height 5\' 7"  (1.702 m), weight 70 kg, SpO2 97%. Physical Exam Vitals reviewed.  HENT:     Head: Normocephalic.     Nose: Nose normal.     Mouth/Throat:     Mouth: Mucous membranes are moist.  Eyes:     Pupils: Pupils are equal, round, and reactive to light.  Cardiovascular:     Rate and Rhythm: Normal rate.     Pulses: Normal pulses.  Pulmonary:     Effort: Pulmonary effort is normal.  Abdominal:     General: Abdomen is flat.  Musculoskeletal:     Cervical back: Normal range of motion.  Skin:    General: Skin is warm.     Capillary Refill: Capillary refill takes less than 2 seconds.  Neurological:     General: No focal deficit present.     Mental Status: He is alert.  Psychiatric:        Mood and Affect: Mood normal.   Examination of bilateral upper extremities demonstrates good range of motion of the elbows wrist and shoulders.  Small abrasion on the right-hand side.  Patient has full pronation supination of  both hands.  Radial pulses intact bilaterally.  Slight tremor noted in both hands. Well-healed surgical incision on the left-hand side from prior anterior approach total hip replacement.  Well-healed surgical incision on the right knee from prior right knee replacement.  No effusion in the knee.  Patient has intact pedal pulses with ankle dorsiflexion intact on the right. Assessment/Plan: Impression is displaced neck fracture on the right-hand  side.  This occurred following a mechanical fall.  No loss of consciousness.  Plan anterior approach total hip replacement.  Risk and benefits are discussed with the patient include not limited to infection or vessel damage instability leg length inequality as well as delayed functional recovery.  Patient understands risk and benefits and wishes to proceed.  All questions answered  Frederick Pham 01/31/2023, 5:31 PM

## 2023-01-31 NOTE — Progress Notes (Signed)
Pt is getting aggressive, disorientation, cursing. Moves all extremities.  Did not remember he had surgery and we are in his property. CXR done,

## 2023-01-31 NOTE — Progress Notes (Signed)
Initial Nutrition Assessment  INTERVENTION:   Once diet advanced: -Ensure Enlive po BID, each supplement provides 350 kcal and 20 grams of protein. -Multivitamin with minerals daily  NUTRITION DIAGNOSIS:   Increased nutrient needs related to post-op healing, hip fracture as evidenced by estimated needs.  GOAL:   Patient will meet greater than or equal to 90% of their needs  MONITOR:   PO intake, Supplement acceptance, Labs, Weight trends, I & O's  REASON FOR ASSESSMENT:   Consult Hip fracture protocol  ASSESSMENT:   72 y.o. male with medical history significant for Parkinsonism, CLL, BPH, and urinary retention with Foley catheter who presents with right leg pain and inability to bear weight after a trip and fall. Admitted for right hip fracture.  RD attempted to speak with patient at bedside, pt remained on a phone call. Will attempt to speak with patient at a later time. Pt is currently NPO, awaiting surgery this afternoon.  Once diet is advanced following surgery, would benefit from protein supplement.  Per weight records, no weight loss noted.  Medications reviewed.  Labs reviewed.  NUTRITION - FOCUSED PHYSICAL EXAM:  Unable to complete  Diet Order:   Diet Order             Diet NPO time specified  Diet effective ____                   EDUCATION NEEDS:   Not appropriate for education at this time  Skin:  Skin Assessment: Reviewed RN Assessment  Last BM:  8/19  Height:   Ht Readings from Last 1 Encounters:  01/30/23 5\' 7"  (1.702 m)    Weight:   Wt Readings from Last 1 Encounters:  01/30/23 70 kg    BMI:  Body mass index is 24.17 kg/m.  Estimated Nutritional Needs:   Kcal:  1750-1950  Protein:  80-95g  Fluid:  1.9L/day    Tilda Franco, MS, RD, LDN Inpatient Clinical Dietitian Contact information available via Amion

## 2023-01-31 NOTE — Anesthesia Preprocedure Evaluation (Signed)
Anesthesia Evaluation  Patient identified by MRN, date of birth, ID band Patient awake    Reviewed: Allergy & Precautions, H&P , NPO status , Patient's Chart, lab work & pertinent test results  Airway Mallampati: II  TM Distance: >3 FB Neck ROM: Full    Dental no notable dental hx.    Pulmonary neg pulmonary ROS, former smoker   Pulmonary exam normal breath sounds clear to auscultation       Cardiovascular negative cardio ROS Normal cardiovascular exam Rhythm:Regular Rate:Normal     Neuro/Psych Parkinsonism  negative psych ROS   GI/Hepatic negative GI ROS, Neg liver ROS,,,  Endo/Other  negative endocrine ROS    Renal/GU Renal disease  negative genitourinary   Musculoskeletal  (+) Arthritis ,    Abdominal   Peds negative pediatric ROS (+)  Hematology CLL   Anesthesia Other Findings   Reproductive/Obstetrics negative OB ROS                              Anesthesia Physical Anesthesia Plan  ASA: 2  Anesthesia Plan: Spinal   Post-op Pain Management:    Induction: Intravenous  PONV Risk Score and Plan: Ondansetron and Dexamethasone  Airway Management Planned: Natural Airway  Additional Equipment:   Intra-op Plan:   Post-operative Plan: Extubation in OR  Informed Consent: I have reviewed the patients History and Physical, chart, labs and discussed the procedure including the risks, benefits and alternatives for the proposed anesthesia with the patient or authorized representative who has indicated his/her understanding and acceptance.     Dental advisory given  Plan Discussed with: CRNA  Anesthesia Plan Comments:          Anesthesia Quick Evaluation

## 2023-02-01 ENCOUNTER — Telehealth: Payer: Medicare Other

## 2023-02-01 ENCOUNTER — Encounter (HOSPITAL_COMMUNITY): Payer: Self-pay | Admitting: Orthopedic Surgery

## 2023-02-01 LAB — CBC
HCT: 37.6 % — ABNORMAL LOW (ref 39.0–52.0)
Hemoglobin: 12.1 g/dL — ABNORMAL LOW (ref 13.0–17.0)
MCH: 30.6 pg (ref 26.0–34.0)
MCHC: 32.2 g/dL (ref 30.0–36.0)
MCV: 94.9 fL (ref 80.0–100.0)
Platelets: 100 K/uL — ABNORMAL LOW (ref 150–400)
RBC: 3.96 MIL/uL — ABNORMAL LOW (ref 4.22–5.81)
RDW: 14.6 % (ref 11.5–15.5)
WBC: 49.5 K/uL — ABNORMAL HIGH (ref 4.0–10.5)
nRBC: 0 % (ref 0.0–0.2)

## 2023-02-01 LAB — BASIC METABOLIC PANEL WITH GFR
Anion gap: 7 (ref 5–15)
BUN: 17 mg/dL (ref 8–23)
CO2: 26 mmol/L (ref 22–32)
Calcium: 8.6 mg/dL — ABNORMAL LOW (ref 8.9–10.3)
Chloride: 104 mmol/L (ref 98–111)
Creatinine, Ser: 0.85 mg/dL (ref 0.61–1.24)
GFR, Estimated: >60 mL/min
Glucose, Bld: 157 mg/dL — ABNORMAL HIGH (ref 70–99)
Potassium: 4.3 mmol/L (ref 3.5–5.1)
Sodium: 137 mmol/L (ref 135–145)

## 2023-02-01 MED ORDER — METHOCARBAMOL 1000 MG/10ML IJ SOLN: 500.0000 mg | Freq: Four times a day (QID) | INTRAVENOUS | Status: DC | PRN

## 2023-02-01 MED ORDER — ACETAMINOPHEN 325 MG PO TABS: 325.0000 mg | ORAL_TABLET | Freq: Four times a day (QID) | ORAL | Status: DC | PRN

## 2023-02-01 MED ORDER — ACETAMINOPHEN 500 MG PO TABS
1000.0000 mg | ORAL_TABLET | Freq: Four times a day (QID) | ORAL | Status: DC
  Administered 2023-02-01: 1000 mg via ORAL
  Filled 2023-02-01: qty 2

## 2023-02-01 MED ORDER — TRANEXAMIC ACID-NACL 1000-0.7 MG/100ML-% IV SOLN
1000.0000 mg | Freq: Once | INTRAVENOUS | Status: AC
  Administered 2023-02-01: 1000 mg via INTRAVENOUS
  Filled 2023-02-01: qty 100

## 2023-02-01 MED ORDER — PHENOL 1.4 % MT LIQD: 1.0000 | OROMUCOSAL | Status: DC | PRN

## 2023-02-01 MED ORDER — METHOCARBAMOL 500 MG PO TABS: 500.0000 mg | ORAL_TABLET | Freq: Four times a day (QID) | ORAL | Status: DC | PRN

## 2023-02-01 MED ORDER — METOCLOPRAMIDE HCL 5 MG PO TABS: 5.0000 mg | ORAL_TABLET | Freq: Three times a day (TID) | ORAL | Status: DC | PRN

## 2023-02-01 MED ORDER — CEFAZOLIN SODIUM-DEXTROSE 2-4 GM/100ML-% IV SOLN
2.0000 g | Freq: Three times a day (TID) | INTRAVENOUS | Status: DC
  Administered 2023-02-01: 2 g via INTRAVENOUS
  Filled 2023-02-01: qty 100

## 2023-02-01 MED ORDER — OYSTER SHELL CALCIUM/D3 500-5 MG-MCG PO TABS: 2.0000 | ORAL_TABLET | Freq: Two times a day (BID) | ORAL | 0 refills | Status: AC

## 2023-02-01 MED ORDER — HYDROMORPHONE HCL 1 MG/ML IJ SOLN: 0.5000 mg | INTRAMUSCULAR | Status: DC | PRN

## 2023-02-01 MED ORDER — ONDANSETRON HCL 4 MG/2ML IJ SOLN: 4.0000 mg | Freq: Four times a day (QID) | INTRAMUSCULAR | Status: DC | PRN

## 2023-02-01 MED ORDER — ASPIRIN 81 MG PO TBEC
81.0000 mg | DELAYED_RELEASE_TABLET | Freq: Two times a day (BID) | ORAL | Status: DC
  Administered 2023-02-01: 81 mg via ORAL
  Filled 2023-02-01: qty 1

## 2023-02-01 MED ORDER — DOCUSATE SODIUM 100 MG PO CAPS: 100.0000 mg | ORAL_CAPSULE | Freq: Two times a day (BID) | ORAL | 0 refills | Status: DC

## 2023-02-01 MED ORDER — ASPIRIN 81 MG PO TBEC: 81.0000 mg | DELAYED_RELEASE_TABLET | Freq: Two times a day (BID) | ORAL | 12 refills | Status: DC

## 2023-02-01 MED ORDER — OXYCODONE HCL 5 MG PO TABS
5.0000 mg | ORAL_TABLET | ORAL | Status: DC | PRN
  Administered 2023-02-01: 5 mg via ORAL

## 2023-02-01 MED ORDER — OXYCODONE HCL 5 MG PO TABS: 5.0000 mg | ORAL_TABLET | ORAL | 0 refills | Status: DC | PRN

## 2023-02-01 MED ORDER — OYSTER SHELL CALCIUM/D3 500-5 MG-MCG PO TABS: 2.0000 | ORAL_TABLET | Freq: Two times a day (BID) | ORAL | Status: DC

## 2023-02-01 MED ORDER — DOCUSATE SODIUM 100 MG PO CAPS
100.0000 mg | ORAL_CAPSULE | Freq: Two times a day (BID) | ORAL | Status: DC
  Administered 2023-02-01: 100 mg via ORAL
  Filled 2023-02-01: qty 1

## 2023-02-01 MED ORDER — ONDANSETRON HCL 4 MG PO TABS: 4.0000 mg | ORAL_TABLET | Freq: Four times a day (QID) | ORAL | Status: DC | PRN

## 2023-02-01 MED ORDER — METOCLOPRAMIDE HCL 5 MG/ML IJ SOLN: 5.0000 mg | Freq: Three times a day (TID) | INTRAMUSCULAR | Status: DC | PRN

## 2023-02-01 MED ORDER — MENTHOL 3 MG MT LOZG: 1.0000 | LOZENGE | OROMUCOSAL | Status: DC | PRN

## 2023-02-01 MED ORDER — METHOCARBAMOL 500 MG PO TABS: 500.0000 mg | ORAL_TABLET | Freq: Four times a day (QID) | ORAL | 0 refills | Status: DC | PRN

## 2023-02-01 NOTE — Plan of Care (Signed)
Discharge instructions given to the patient and his wife including medications and follow up.  

## 2023-02-01 NOTE — Plan of Care (Signed)
  Problem: Clinical Measurements: Goal: Ability to maintain clinical measurements within normal limits will improve Outcome: Progressing   Problem: Activity: Goal: Risk for activity intolerance will decrease Outcome: Progressing   Problem: Pain Managment: Goal: General experience of comfort will improve Outcome: Progressing   Problem: Safety: Goal: Ability to remain free from injury will improve Outcome: Progressing   Problem: Skin Integrity: Goal: Risk for impaired skin integrity will decrease Outcome: Progressing

## 2023-02-01 NOTE — Telephone Encounter (Signed)
Patient will need to be seen in person as TOC visit. Home health services require in person evaluation. We have availability tomorrow.

## 2023-02-01 NOTE — Progress Notes (Signed)
  Subjective: Patient stable.  Pain controlled.  Has been ambulating in the hall.  Is seeing physical therapy who has given him the greenlight per wife's report for discharge to home.   Objective: Vital signs in last 24 hours: Temp:  [98 F (36.7 C)-98.5 F (36.9 C)] 98.2 F (36.8 C) (08/22 0910) Pulse Rate:  [51-75] 66 (08/22 0910) Resp:  [10-19] 18 (08/22 0910) BP: (87-155)/(51-96) 104/72 (08/22 0910) SpO2:  [97 %-100 %] 97 % (08/22 0910)  Intake/Output from previous day: 08/21 0701 - 08/22 0700 In: 2269.1 [I.V.:1969.1; IV Piggyback:300] Out: 3400 [Urine:3150; Blood:250] Intake/Output this shift: No intake/output data recorded.  Exam:  Intact pulses distally Dorsiflexion/Plantar flexion intact No cellulitis present  Labs: Recent Labs    01/30/23 2056 01/31/23 0438 02/01/23 0329  HGB 12.6* 11.7* 12.1*   Recent Labs    01/31/23 0438 02/01/23 0329  WBC 42.5* 49.5*  RBC 3.87* 3.96*  HCT 36.6* 37.6*  PLT 125* 100*   Recent Labs    01/31/23 0438 02/01/23 0329  NA 136 137  K 4.1 4.3  CL 104 104  CO2 25 26  BUN 21 17  CREATININE 1.12 0.85  GLUCOSE 181* 157*  CALCIUM 8.7* 8.6*   No results for input(s): "LABPT", "INR" in the last 72 hours.  Assessment/Plan: Plan at this time is discharge to home.  Foley catheter remains in place for urologic recommendation.  Patient will follow-up with [place] in 2 weeks.  Weightbearing as tolerated.  He will need some home health physical therapy as well.   G Scott Azlyn Wingler 02/01/2023, 10:48 AM

## 2023-02-01 NOTE — Plan of Care (Signed)
  Problem: Education: Goal: Knowledge of the prescribed therapeutic regimen will improve Outcome: Progressing   Problem: Activity: Goal: Ability to avoid complications of mobility impairment will improve Outcome: Progressing   Problem: Pain Management: Goal: Pain level will decrease with appropriate interventions Outcome: Progressing   

## 2023-02-01 NOTE — Evaluation (Addendum)
Physical Therapy One Time Evaluation Patient Details Name: Frederick Pham MRN: [id number] DOB: [birth year] Today's Date: 02/01/2023  History of Present Illness  72 y.o. male with medical history significant for Parkinsonism, CLL, BPH, and urinary retention with Foley catheter and admitted 01/30/23 after fall sustaining right femoral neck fx. Pt s/p right THA direct anterior approach on 01/31/23.  Clinical Impression  Patient evaluated by Physical Therapy with no further acute PT needs identified. All education has been completed and the patient has no further questions.  Pt reporting wanting to d/c and appeared somewhat agitated with spouse on arrival.  Pt allowed therapist to assist with mobility in anticipation of d/c home today.   Pt has DME and spouse reports she feels she can assist him at home. Spouse requested HEP which was provided.  Spouse reports being familiar with safe mobility and exercises from pt's previous hip surgery, also has gait belts at home. See below for any follow-up Physical Therapy or equipment needs. PT is signing off. Thank you for this referral.         If plan is discharge home, recommend the following: A little help with walking and/or transfers;A little help with bathing/dressing/bathroom;Assist for transportation;Help with stairs or ramp for entrance;Assistance with cooking/housework   Can travel by private vehicle        Equipment Recommendations None recommended by PT  Recommendations for Other Services       Functional Status Assessment Patient has had a recent decline in their functional status and demonstrates the ability to make significant improvements in function in a reasonable and predictable amount of time.     Precautions / Restrictions Precautions Precautions: Fall Restrictions Weight Bearing Restrictions: No Other Position/Activity Restrictions: WBAT      Mobility  Bed Mobility Overal bed mobility: Needs Assistance Bed Mobility:  Supine to Sit     Supine to sit: Supervision, HOB elevated     General bed mobility comments: pt self assisted Rt LE    Transfers Overall transfer level: Needs assistance Equipment used: Rolling walker (2 wheels) Transfers: Sit to/from Stand Sit to Stand: Contact guard assist           General transfer comment: for safety, cues for hand placement    Ambulation/Gait Ambulation/Gait assistance: Contact guard assist Gait Distance (Feet): 50 Feet Assistive device: Rolling walker (2 wheels) Gait Pattern/deviations: Step-through pattern, Decreased stride length, Antalgic, Decreased stance time - right       General Gait Details: pt denies pains, steady with RW, cues for keeping RW close; distance limited by pt (really wants to d/c home today)  Stairs            Wheelchair Mobility     Tilt Bed    Modified Rankin (Stroke Patients Only)       Balance Overall balance assessment: History of Falls                                           Pertinent Vitals/Pain Pain Assessment Pain Assessment: No/denies pain    Home Living Family/patient expects to be discharged to:: Private residence Living Arrangements: Spouse/significant other Available Help at Discharge: Family;Available 24 hours/day Type of Home: House Home Access: Level entry       Home Layout: Multi-level;Able to live on main level with bedroom/bathroom Home Equipment: Rolling Walker (2 wheels);Shower seat;Toilet riser      Prior  Function Prior Level of Function : Needs assist             Mobility Comments: ambulatory with RW ADLs Comments: requiring assistance     Extremity/Trunk Assessment        Lower Extremity Assessment Lower Extremity Assessment: RLE deficits/detail RLE Deficits / Details: anticipated post op hip weakness       Communication   Communication Communication: No apparent difficulties  Cognition Arousal: Alert Behavior During Therapy:  Restless, Agitated Overall Cognitive Status: History of cognitive impairments - at baseline                                          General Comments      Exercises     Assessment/Plan    PT Assessment All further PT needs can be met in the next venue of care  PT Problem List Decreased strength;Decreased activity tolerance;Decreased mobility;Decreased balance;Decreased knowledge of use of DME       PT Treatment Interventions      PT Goals (Current goals can be found in the Care Plan section)  Acute Rehab PT Goals PT Goal Formulation: All assessment and education complete, DC therapy    Frequency       Co-evaluation               AM-PAC PT "6 Clicks" Mobility  Outcome Measure Help needed turning from your back to your side while in a flat bed without using bedrails?: A Little Help needed moving from lying on your back to sitting on the side of a flat bed without using bedrails?: A Little Help needed moving to and from a bed to a chair (including a wheelchair)?: A Little Help needed standing up from a chair using your arms (e.g., wheelchair or bedside chair)?: A Little Help needed to walk in hospital room?: A Little Help needed climbing 3-5 steps with a railing? : A Little 6 Click Score: 18    End of Session Equipment Utilized During Treatment: Gait belt Activity Tolerance: Patient tolerated treatment well Patient left: in chair;with call bell/phone within reach;with family/visitor present Nurse Communication: Mobility status PT Visit Diagnosis: Difficulty in walking, not elsewhere classified (R26.2)    Time: 1001-1015 PT Time Calculation (min) (ACUTE ONLY): 14 min   Charges:   PT Evaluation $PT Eval Low Complexity: 1 Low   PT General Charges $$ ACUTE PT VISIT: 1 Visit        Thomasene Mohair PT, DPT Physical Therapist Acute Rehabilitation Services Office: [phone]  Janan Halter [place] 02/01/2023, 11:16 AM

## 2023-02-01 NOTE — Telephone Encounter (Signed)
Frederick Pham with [facility] health called stating that order was put in for patient for PT from [facility] after patient discharged. She states that the order is not signed. She called back to [employer] and spoke with the case manager and was told that since the order was put in after patient discharged that physician could not sign order. Rosanne Sack would like to know if you could place the order. She states that it does not need to be faxed because she can pull it from [facility] herself. She is looking to start services on Saturday. Please advise.  Message sent to Hazle Nordmann, NP

## 2023-02-02 NOTE — Anesthesia Postprocedure Evaluation (Signed)
Anesthesia Post Note  Patient: Verlin Mannino Briseno  Procedure(s) Performed: TOTAL HIP ARTHROPLASTY ANTERIOR APPROACH (Right: Hip)     Patient location during evaluation: PACU Anesthesia Type: Spinal Level of consciousness: awake and alert and confused Pain management: pain level controlled Vital Signs Assessment: post-procedure vital signs reviewed and stable Respiratory status: spontaneous breathing, respiratory function stable and patient connected to nasal cannula oxygen Cardiovascular status: blood pressure returned to baseline and stable Postop Assessment: no headache, no backache and no apparent nausea or vomiting Anesthetic complications: no   No notable events documented.  Last Vitals:  Vitals:   02/01/23 0630 02/01/23 0910  BP: (!) 140/72 104/72  Pulse: 68 66  Resp: 17 18  Temp: 36.7 C 36.8 C  SpO2: 98% 97%    Last Pain:  Vitals:   02/01/23 1035  TempSrc:   PainSc: 0-No pain                 [place]

## 2023-02-05 ENCOUNTER — Telehealth: Payer: Self-pay

## 2023-02-05 ENCOUNTER — Other Ambulatory Visit: Payer: Self-pay

## 2023-02-05 ENCOUNTER — Telehealth: Payer: Medicare Other

## 2023-02-05 NOTE — Progress Notes (Signed)
Frederick Pham

## 2023-02-05 NOTE — Telephone Encounter (Signed)
Patient has appointment scheduled for 02/08/23. [name] would like to know if appointment must be in office so she will know how to plan for visit.  Baird Lyons with [facility] also called again wanting to know if order can be placed. for PT so they can see patient this week.  Message sent to Hazle Nordmann, NP

## 2023-02-05 NOTE — Transitions of Care (Post Inpatient/ED Visit) (Signed)
   02/05/2023  Name: Frederick Pham MRN: [id number] DOB: 10-Dec-1950  Today's TOC FU Call Status:    Attempted to reach the patient regarding the most recent Inpatient/ED visit. No answer message was left.  Follow Up Plan:  Additional outreach attempts will be made to reach the patient to complete the Transitions of Care (Post Inpatient/ED visit) call.   Signature Elveria Royals, CMA

## 2023-02-05 NOTE — Transitions of Care (Post Inpatient/ED Visit) (Unsigned)
   02/05/2023  Name: Frederick Pham MRN: [id number] DOB: June 26, 1950  Today's TOC FU Call Status: Today's TOC FU Call Status:: Successful TOC FU Call Completed Patient's Name and Date of Birth confirmed.  Transition Care Management Follow-up Telephone Call Date of Discharge: 02/01/23 Discharge Facility: [facility]) Type of Discharge: Inpatient Admission How have you been since you were released from the hospital?: Better Any questions or concerns?: Yes Patient Questions/Concerns:: Should patient be taking calcium supplements since this is patient's second hip fracture within a year. Patient Questions/Concerns Addressed: Notified Provider of Patient Questions/Concerns  Items Reviewed: Did you receive and understand the discharge instructions provided?: Yes Medications obtained,verified, and reconciled?: Yes (Medications Reviewed) Any new allergies since your discharge?: No Dietary orders reviewed?: NA Do you have support at home?: Yes People in Home: spouse  Medications Reviewed Today: Medications Reviewed Today     Reviewed by Elveria Royals, CMA (Certified Medical Assistant) on 02/05/23 at 1147  Med List Status: <None>   Medication Order Taking? Sig Documenting Provider Last Dose Status Informant  acetaminophen (TYLENOL) 500 MG tablet [id number] Yes Take 500 mg by mouth as needed for moderate pain. [provider] Taking Active Self, Pharmacy Records  aspirin EC 81 MG tablet [id number] Yes Take 1 tablet (81 mg total) by mouth 2 (two) times daily. Swallow whole. Cammy Copa, MD Taking Active   calcium-vitamin D Ruthell Rummage WITH D) 500-5 MG-MCG tablet [id number] Yes Take 2 tablets by mouth 2 (two) times daily. Rolly Salter, MD Taking Active   carbidopa-levodopa (SINEMET IR) 25-100 MG tablet [id number] Yes Take 1.5 tablets by mouth 3 (three) times daily. 7am/11am/4pm Tat, Octaviano Batty, DO Taking Active Self, Pharmacy Records  docusate sodium (COLACE) 100 MG capsule [id number]  Yes Take 1 capsule (100 mg total) by mouth 2 (two) times daily. Cammy Copa, MD Taking Active   methocarbamol (ROBAXIN) 500 MG tablet [id number] Yes Take 1 tablet (500 mg total) by mouth every 6 (six) hours as needed for muscle spasms. Cammy Copa, MD Taking Active   oxyCODONE (OXY IR/ROXICODONE) 5 MG immediate release tablet [id number] Yes Take 1 tablet (5 mg total) by mouth every 4 (four) hours as needed for moderate pain. Cammy Copa, MD Taking Active   tamsulosin [facility]) 0.4 MG CAPS capsule [id number] Yes Take 1 capsule (0.4 mg total) by mouth daily.  Patient taking differently: Take 0.4 mg by mouth in the morning and at bedtime.   Burnadette Pop, MD Taking Active Self, Pharmacy Records            Home Care and Equipment/Supplies: Were Home Health Services Ordered?: No Any new equipment or medical supplies ordered?: No  Functional Questionnaire: Do you need assistance with bathing/showering or dressing?: Yes Do you need assistance with meal preparation?: Yes Do you need assistance with eating?: No Do you have difficulty maintaining continence: No Do you need assistance with getting out of bed/getting out of a chair/moving?: Yes Do you have difficulty managing or taking your medications?: No  Follow up appointments reviewed: PCP Follow-up appointment confirmed?: Yes    SIGNATURE***

## 2023-02-06 ENCOUNTER — Other Ambulatory Visit: Payer: Self-pay | Admitting: Orthopedic Surgery

## 2023-02-06 DIAGNOSIS — G20B1 Parkinson's disease with dyskinesia, without mention of fluctuations: Secondary | ICD-10-CM

## 2023-02-06 DIAGNOSIS — S72001D Fracture of unspecified part of neck of right femur, subsequent encounter for closed fracture with routine healing: Secondary | ICD-10-CM

## 2023-02-06 NOTE — Telephone Encounter (Signed)
Orders for [facility] PT placed. If they are not approved> its because last face to face visit was 06/27. He is scheduled for follow up with me 02/15/2023.

## 2023-02-07 NOTE — Telephone Encounter (Signed)
I called to follow up with [facility] and she sated that she has the order.

## 2023-02-08 ENCOUNTER — Ambulatory Visit: Payer: Medicare Other | Admitting: Orthopedic Surgery

## 2023-02-09 NOTE — Discharge Summary (Signed)
Physician Discharge Summary      Patient ID: Frederick Pham MRN: [id number] DOB/AGE: 08-21-1950 72 y.o.  Admit date: 01/30/2023 Discharge date: 02/01/2023  Admission Diagnoses:  Principal Problem:   Closed right hip fracture, initial encounter [facility]) Active Problems:   Chronic lymphocytic leukemia, Rai stage I (HCC)   Thrombocytopenia (HCC)   Parkinsonism   Discharge Diagnoses:  Same  Surgeries: Procedure(s): TOTAL HIP ARTHROPLASTY ANTERIOR APPROACH on 01/31/2023   Consultants:   Discharged Condition: Stable  Hospital Course: Frederick Pham is an 72 y.o. male who was admitted 01/30/2023 with a chief complaint of right hip pain, and found to have a diagnosis of right hip femoral neck fracture.  They were brought to the operating room on 01/31/2023 and underwent the above named procedures.  Pt awoke from anesthesia without complication and was transferred to the floor. On POD1, patient's pain was controlled and improved compared with prior to surgery.  He ambulated well with physical therapy.  He was discharged home on POD 1.  No red flag signs or symptoms throughout his stay..  Pt will f/u with Dr. August Saucer in clinic in ~2 weeks.   Antibiotics given:  Anti-infectives (From admission, onward)    Start     Dose/Rate Route Frequency Ordered Stop   02/01/23 0200  ceFAZolin (ANCEF) IVPB 2g/100 mL premix  Status:  Discontinued        2 g 200 mL/hr over 30 Minutes Intravenous Every 8 hours 02/01/23 0037 02/01/23 1621   01/31/23 1908  vancomycin (VANCOCIN) powder  Status:  Discontinued          As needed 01/31/23 1908 01/31/23 2121   01/31/23 1730  ceFAZolin (ANCEF) IVPB 2g/100 mL premix  Status:  Discontinued        2 g 200 mL/hr over 30 Minutes Intravenous  Once 01/30/23 2214 02/01/23 1621   01/31/23 1630  ceFAZolin (ANCEF) IVPB 2g/100 mL premix        2 g 200 mL/hr over 30 Minutes Intravenous On call to O.R. 01/31/23 0234 01/31/23 1744     .  Recent vital signs:  Vitals:    02/01/23 0630 02/01/23 0910  BP: (!) 140/72 104/72  Pulse: 68 66  Resp: 17 18  Temp: 98 F (36.7 C) 98.2 F (36.8 C)  SpO2: 98% 97%    Recent laboratory studies:  Results for orders placed or performed during the hospital encounter of 01/30/23  Surgical pcr screen   Specimen: Nasal Mucosa; Nasal Swab  Result Value Ref Range   MRSA, PCR NEGATIVE NEGATIVE   Staphylococcus aureus NEGATIVE NEGATIVE  CBC with Differential  Result Value Ref Range   WBC 41.2 (H) 4.0 - 10.5 K/uL   RBC 4.09 (L) 4.22 - 5.81 MIL/uL   Hemoglobin 12.6 (L) 13.0 - 17.0 g/dL   HCT 62.1 (L) 30.8 - 65.7 %   MCV 93.9 80.0 - 100.0 fL   MCH 30.8 26.0 - 34.0 pg   MCHC 32.8 30.0 - 36.0 g/dL   RDW 84.6 96.2 - 95.2 %   Platelets 101 (L) 150 - 400 K/uL   nRBC 0.0 0.0 - 0.2 %   Neutrophils Relative % 6 %   Neutro Abs 2.3 1.7 - 7.7 K/uL   Lymphocytes Relative 94 %   Lymphs Abs 38.6 (H) 0.7 - 4.0 K/uL   Monocytes Relative 0 %   Monocytes Absolute 0.1 0.1 - 1.0 K/uL   Eosinophils Relative 0 %   Eosinophils Absolute 0.0 0.0 - 0.5  K/uL   Basophils Relative 0 %   Basophils Absolute 0.1 0.0 - 0.1 K/uL   Immature Granulocytes 0 %   Abs Immature Granulocytes 0.03 0.00 - 0.07 K/uL   Smudge Cells PRESENT   Comprehensive metabolic panel  Result Value Ref Range   Sodium 139 135 - 145 mmol/L   Potassium 4.6 3.5 - 5.1 mmol/L   Chloride 104 98 - 111 mmol/L   CO2 26 22 - 32 mmol/L   Glucose, Bld 120 (H) 70 - 99 mg/dL   BUN 24 (H) 8 - 23 mg/dL   Creatinine, Ser 1.61 0.61 - 1.24 mg/dL   Calcium 9.0 8.9 - 09.6 mg/dL   Total Protein 6.2 (L) 6.5 - 8.1 g/dL   Albumin 3.9 3.5 - 5.0 g/dL   AST 21 15 - 41 U/L   ALT 8 0 - 44 U/L   Alkaline Phosphatase 85 38 - 126 U/L   Total Bilirubin 1.1 0.3 - 1.2 mg/dL   GFR, Estimated >04 >54 mL/min   Anion gap 9 5 - 15  Magnesium  Result Value Ref Range   Magnesium 2.2 1.7 - 2.4 mg/dL  CBC  Result Value Ref Range   WBC 42.5 (H) 4.0 - 10.5 K/uL   RBC 3.87 (L) 4.22 - 5.81 MIL/uL    Hemoglobin 11.7 (L) 13.0 - 17.0 g/dL   HCT 09.8 (L) 11.9 - 14.7 %   MCV 94.6 80.0 - 100.0 fL   MCH 30.2 26.0 - 34.0 pg   MCHC 32.0 30.0 - 36.0 g/dL   RDW 82.9 56.2 - 13.0 %   Platelets 125 (L) 150 - 400 K/uL   nRBC 0.0 0.0 - 0.2 %  Basic metabolic panel  Result Value Ref Range   Sodium 136 135 - 145 mmol/L   Potassium 4.1 3.5 - 5.1 mmol/L   Chloride 104 98 - 111 mmol/L   CO2 25 22 - 32 mmol/L   Glucose, Bld 181 (H) 70 - 99 mg/dL   BUN 21 8 - 23 mg/dL   Creatinine, Ser 8.65 0.61 - 1.24 mg/dL   Calcium 8.7 (L) 8.9 - 10.3 mg/dL   GFR, Estimated >78 >46 mL/min   Anion gap 7 5 - 15  CBC  Result Value Ref Range   WBC 49.5 (H) 4.0 - 10.5 K/uL   RBC 3.96 (L) 4.22 - 5.81 MIL/uL   Hemoglobin 12.1 (L) 13.0 - 17.0 g/dL   HCT 96.2 (L) 95.2 - 84.1 %   MCV 94.9 80.0 - 100.0 fL   MCH 30.6 26.0 - 34.0 pg   MCHC 32.2 30.0 - 36.0 g/dL   RDW 32.4 40.1 - 02.7 %   Platelets 100 (L) 150 - 400 K/uL   nRBC 0.0 0.0 - 0.2 %  Basic metabolic panel  Result Value Ref Range   Sodium 137 135 - 145 mmol/L   Potassium 4.3 3.5 - 5.1 mmol/L   Chloride 104 98 - 111 mmol/L   CO2 26 22 - 32 mmol/L   Glucose, Bld 157 (H) 70 - 99 mg/dL   BUN 17 8 - 23 mg/dL   Creatinine, Ser 2.53 0.61 - 1.24 mg/dL   Calcium 8.6 (L) 8.9 - 10.3 mg/dL   GFR, Estimated >66 >44 mL/min   Anion gap 7 5 - 15    Discharge Medications:   Allergies as of 02/01/2023       Reactions   Morphine Other (See Comments)   " Caused extreme confusion and combativeness "  Medication List     STOP taking these medications    traMADol 50 MG tablet Commonly known as: ULTRAM       TAKE these medications    acetaminophen 500 MG tablet Commonly known as: TYLENOL Take 500 mg by mouth as needed for moderate pain.   aspirin EC 81 MG tablet Take 1 tablet (81 mg total) by mouth 2 (two) times daily. Swallow whole.   calcium-vitamin D 500-5 MG-MCG tablet Commonly known as: OSCAL WITH D Take 2 tablets by mouth 2 (two) times  daily.   carbidopa-levodopa 25-100 MG tablet Commonly known as: SINEMET IR Take 1.5 tablets by mouth 3 (three) times daily. 7am/11am/4pm   docusate sodium 100 MG capsule Commonly known as: COLACE Take 1 capsule (100 mg total) by mouth 2 (two) times daily.   methocarbamol 500 MG tablet Commonly known as: ROBAXIN Take 1 tablet (500 mg total) by mouth every 6 (six) hours as needed for muscle spasms.   oxyCODONE 5 MG immediate release tablet Commonly known as: Oxy IR/ROXICODONE Take 1 tablet (5 mg total) by mouth every 4 (four) hours as needed for moderate pain.   tamsulosin 0.4 MG Caps capsule Commonly known as: FLOMAX Take 1 capsule (0.4 mg total) by mouth daily. What changed: when to take this        Diagnostic Studies: DG Pelvis Portable  Result Date: 01/31/2023 CLINICAL DATA:  Status post right hip replacement EXAM: PORTABLE PELVIS 1-2 VIEWS COMPARISON:  09/14/2021, 01/30/2023 FINDINGS: Interval right hip replacement. Normal AP alignment. No hardware bony complicating feature. Remote changes of left hip replacement, stable. IMPRESSION: Interval right hip replacement.  No visible complicating feature. Electronically Signed   By: Charlett Nose M.D.   On: 01/31/2023 23:24   DG HIP UNILAT WITH PELVIS 1V RIGHT  Result Date: 01/31/2023 CLINICAL DATA:  Right hip replacement EXAM: DG HIP (WITH OR WITHOUT PELVIS) 1V RIGHT COMPARISON:  01/30/2023 FINDINGS: Multiple intraoperative spot images demonstrate interval right hip replacement. No hardware or bony complicating feature. Normal AP alignment. Remote changes of left hip replacement. IMPRESSION: Right hip replacement.  No visible complicating feature. Electronically Signed   By: Charlett Nose M.D.   On: 01/31/2023 23:23   DG C-Arm 1-60 Min-No Report  Result Date: 01/31/2023 Fluoroscopy was utilized by the requesting physician.  No radiographic interpretation.   DG C-Arm 1-60 Min-No Report  Result Date: 01/31/2023 Fluoroscopy was  utilized by the requesting physician.  No radiographic interpretation.   DG Chest 1 View  Result Date: 01/30/2023 CLINICAL DATA:  Right shoulder pain after fall EXAM: CHEST  1 VIEW COMPARISON:  09/12/2021 FINDINGS: Stable cardiomediastinal silhouette. Aortic atherosclerotic calcification. No focal consolidation, pleural effusion, or pneumothorax. No displaced rib fractures. IMPRESSION: No acute cardiopulmonary disease. Electronically Signed   By: Minerva Fester M.D.   On: 01/30/2023 21:04   DG Knee 1-2 Views Right  Result Date: 01/30/2023 CLINICAL DATA:  Unable to move right leg after fall. History of knee replacement EXAM: RIGHT KNEE - 1-2 VIEW COMPARISON:  None Available. FINDINGS: No acute fracture or dislocation. Small effusion. Right TKA. No radiographic evidence of loosening. Soft tissues are unremarkable. IMPRESSION: No acute fracture or dislocation. Electronically Signed   By: Minerva Fester M.D.   On: 01/30/2023 21:03   DG Ankle Complete Right  Result Date: 01/30/2023 CLINICAL DATA:  Unable to move right leg after fall EXAM: RIGHT ANKLE - COMPLETE 3+ VIEW COMPARISON:  None Available. FINDINGS: There is no evidence of fracture, dislocation, or  joint effusion. Soft tissues are unremarkable. IMPRESSION: No acute fracture or dislocation. Electronically Signed   By: Minerva Fester M.D.   On: 01/30/2023 21:02   DG Hip Unilat W or Wo Pelvis 2-3 Views Right  Result Date: 01/30/2023 CLINICAL DATA:  Unable to move right leg after fall. EXAM: DG HIP (WITH OR WITHOUT PELVIS) 2-3V RIGHT COMPARISON:  Radiograph 09/14/2021 FINDINGS: Acute displaced subcapital right femoral neck fracture. The distal fragment is displaced superiorly and laterally. The right femoral head remains located in the acetabulum. Left THA. IMPRESSION: Acute displaced subcapital right femoral neck fracture. Electronically Signed   By: Minerva Fester M.D.   On: 01/30/2023 21:01   DG Shoulder Right  Result Date:  01/30/2023 CLINICAL DATA:  Right shoulder pain after fall EXAM: RIGHT SHOULDER - 2+ VIEW COMPARISON:  None Available. FINDINGS: No acute fracture or dislocation. Age-related degenerative changes about the [facility] and glenohumeral joints. Soft tissues are unremarkable. IMPRESSION: No acute fracture or dislocation. Electronically Signed   By: Minerva Fester M.D.   On: 01/30/2023 21:00    Disposition: Discharge disposition: 01-Home or Self Care       Discharge Instructions     Call MD / Call 911   Complete by: As directed    If you experience chest pain or shortness of breath, CALL 911 and be transported to the hospital emergency room.  If you develope a fever above 101 F, pus (white drainage) or increased drainage or redness at the wound, or calf pain, call your surgeon's office.   Constipation Prevention   Complete by: As directed    Drink plenty of fluids.  Prune juice may be helpful.  You may use a stool softener, such as Colace (over the counter) 100 mg twice a day.  Use MiraLax (over the counter) for constipation as needed.   Diet - low sodium heart healthy   Complete by: As directed    Discharge instructions   Complete by: As directed    Okay to be weightbearing as tolerated with walker. Okay to shower dressing is waterproof Follow-up in 2 weeks   Face-to-face encounter (required for Medicare/Medicaid patients)   Complete by: As directed    I Burnard Bunting certify that this patient is under my care and that I, or a nurse practitioner or physician's assistant working with me, had a face-to-face encounter that meets the physician face-to-face encounter requirements with this patient on 02/01/2023. The encounter with the patient was in whole, or in part for the following medical condition(s) which is the primary reason for home health care (List medical condition): Right hip fracture status post hip replacement   The encounter with the patient was in whole, or in part, for the following medical  condition, which is the primary reason for home health care: Right hip fracture   I certify that, based on my findings, the following services are medically necessary home health services: Physical therapy   Reason for Medically Necessary Home Health Services: Therapy- Therapeutic Exercises to Increase Strength and Endurance   My clinical findings support the need for the above services: Pain interferes with ambulation/mobility   Further, I certify that my clinical findings support that this patient is homebound due to: Pain interferes with ambulation/mobility   Home Health   Complete by: As directed    To provide the following care/treatments: PT   Increase activity slowly as tolerated   Complete by: As directed    Post-operative opioid taper instructions:   Complete  by: As directed    POST-OPERATIVE OPIOID TAPER INSTRUCTIONS: It is important to wean off of your opioid medication as soon as possible. If you do not need pain medication after your surgery it is ok to stop day one. Opioids include: Codeine, Hydrocodone(Norco, Vicodin), Oxycodone(Percocet, oxycontin) and hydromorphone amongst others.  Long term and even short term use of opiods can cause: Increased pain response Dependence Constipation Depression Respiratory depression And more.  Withdrawal symptoms can include Flu like symptoms Nausea, vomiting And more Techniques to manage these symptoms Hydrate well Eat regular healthy meals Stay active Use relaxation techniques(deep breathing, meditating, yoga) Do Not substitute Alcohol to help with tapering If you have been on opioids for less than two weeks and do not have pain than it is ok to stop all together.  Plan to wean off of opioids This plan should start within one week post op of your joint replacement. Maintain the same interval or time between taking each dose and first decrease the dose.  Cut the total daily intake of opioids by one tablet each day Next start to  increase the time between doses. The last dose that should be eliminated is the evening dose.           Follow-up Information     [name], NP. Schedule an appointment as soon as possible for a visit in 1 week(s).   Specialty: Adult Health Nurse Practitioner Why: consider bone density scan and therapy for fragility fracture Contact information: [address] [place] [address] [phone]                  Signed: Karenann Cai 02/09/2023, 8:11 AM

## 2023-02-13 ENCOUNTER — Telehealth: Payer: Self-pay | Admitting: Radiology

## 2023-02-13 ENCOUNTER — Telehealth: Payer: Medicare Other

## 2023-02-13 NOTE — Telephone Encounter (Signed)
Discussed need for orthopedic opinion regarding this matter with wife,Susan. Suggested sooner appointment or waiting till Dr. August Saucer is out of surgery for his opinion.

## 2023-02-13 NOTE — Telephone Encounter (Addendum)
[name] called office requesting order for xray. I informed patient that PCP stated for ortho to handle it on 9/5 appointment. She stated that the physical therapist had told them they did not need to wait until Thursday. She also stated that she called the ortho office and that the physician is in surgery and has not been able to get intouch with him. Would like order sent to [facility] in [place]

## 2023-02-13 NOTE — Telephone Encounter (Signed)
Spoke with susan and pt will be here at 115 to see [facility]

## 2023-02-13 NOTE — Telephone Encounter (Signed)
He should come in tomorrow for xrays thx pls call can put on luke schedule

## 2023-02-13 NOTE — Telephone Encounter (Signed)
[facility]PT called, states that she is out there to do PT eval and patient is c/o 10/10 Pain with standing on the right leg, states that he was doing great till a few days ago and has had a lot of pain since. Please call pts wife back to advise. [phone], Darl Pikes.

## 2023-02-13 NOTE — Telephone Encounter (Signed)
Incoming call received , from Home health physical therapist stating patient with multiple falls since hip surgery and requesting a x-ray order of the right hip, they also have contacted orthopedic specialist. Please advise

## 2023-02-13 NOTE — Telephone Encounter (Signed)
Scheduled with Dr. August Saucer 09/05, recommend ortho handle this request.

## 2023-02-13 NOTE — Telephone Encounter (Signed)
Yeah that sounds good, you can make appointment for 1 or 1:15 or 1:30ish

## 2023-02-14 ENCOUNTER — Ambulatory Visit (INDEPENDENT_AMBULATORY_CARE_PROVIDER_SITE_OTHER): Payer: Self-pay

## 2023-02-14 ENCOUNTER — Ambulatory Visit (INDEPENDENT_AMBULATORY_CARE_PROVIDER_SITE_OTHER): Payer: Medicare Other | Admitting: Surgical

## 2023-02-14 ENCOUNTER — Ambulatory Visit
Admission: RE | Admit: 2023-02-14 | Discharge: 2023-02-14 | Disposition: A | Discharge status: Home / Self Care | Payer: Medicare Other | Source: Ambulatory Visit | Attending: Surgical | Admitting: Surgical

## 2023-02-14 ENCOUNTER — Encounter: Payer: Self-pay | Admitting: Surgical

## 2023-02-14 DIAGNOSIS — Z96641 Presence of right artificial hip joint: Secondary | ICD-10-CM

## 2023-02-14 MED ORDER — METHYLPREDNISOLONE 4 MG PO TBPK: ORAL_TABLET | ORAL | 0 refills | Status: DC

## 2023-02-15 ENCOUNTER — Ambulatory Visit: Payer: Medicare Other | Admitting: Neurology

## 2023-02-15 ENCOUNTER — Encounter: Payer: Medicare Other | Admitting: Orthopedic Surgery

## 2023-02-15 ENCOUNTER — Ambulatory Visit (INDEPENDENT_AMBULATORY_CARE_PROVIDER_SITE_OTHER): Payer: Medicare Other | Admitting: Orthopedic Surgery

## 2023-02-15 ENCOUNTER — Encounter: Payer: Self-pay | Admitting: Orthopedic Surgery

## 2023-02-15 VITALS — BP 126/62 | HR 84 | Temp 98.3°F | Resp 16 | Ht 67.0 in | Wt 144.6 lb

## 2023-02-15 DIAGNOSIS — R339 Retention of urine, unspecified: Secondary | ICD-10-CM

## 2023-02-15 DIAGNOSIS — S72001D Fracture of unspecified part of neck of right femur, subsequent encounter for closed fracture with routine healing: Secondary | ICD-10-CM

## 2023-02-15 DIAGNOSIS — G20B1 Parkinson's disease with dyskinesia, without mention of fluctuations: Secondary | ICD-10-CM | POA: Diagnosis not present

## 2023-02-15 DIAGNOSIS — R296 Repeated falls: Secondary | ICD-10-CM | POA: Diagnosis not present

## 2023-02-15 NOTE — Telephone Encounter (Signed)
I called patient.  Informed him of CT scan results.  He will start steroid Dosepak this morning and give [place] update on Monday or Tuesday.  If no improvement, would need either MRI lumbar spine or reevaluation.

## 2023-02-15 NOTE — Patient Instructions (Addendum)
Recommend Caltrate for for bone health- take one daily

## 2023-02-15 NOTE — Progress Notes (Signed)
Careteam: Patient Care Team: [name], NP as PCP - General (Adult Health Nurse Practitioner) Edwin Cap, DPM as Consulting Physician (Podiatry) Ollen Gross, MD as Consulting Physician (Orthopedic Surgery)  Seen by: Hazle Nordmann, AGNP-C  PLACE OF SERVICE:  [facility]  Advanced Directive information Does Patient Have a Medical Advance Directive?: No, Would patient like information on creating a medical advance directive?: No - Patient declined  Allergies  Allergen Reactions   Morphine Other (See Comments)    " Caused extreme confusion and combativeness "     Chief Complaint  Patient presents with   Transitions Of Care    TOC from 02/01/2023 Fall     HPI: Patient is a 72 y.o. male for f/u s/p hospitalization at [facility] 08/20-08/22.   Wife present during encounter.   08/20 he reports tripping on curb on the way to eye doctors office. He was unable to bear weight to right leg after incident. Right hip xray revealed acute displaced subcapital right femoral neck fracture. 08/21 he underwent RATH by Dr. August Saucer. He tolerated procedure well and was discharged home 08/22. HHPT/OT ordered.   F/u with ortho yesterday. Surgical dressing removed. CT right hip ordered due to ongoing pain. Questionable postoperative seroma,hematoma or abscess. He was started on prednisone taper. Also on oxycodone prn for pain. He did not c/o pain during our encounter.   He has had 2 hip fractures within past 18 months. Advised to start Caltrate. If mobility improves with PT, will discuss having bone density done in future.   Followed by Dr. Arbutus Leas for parkinson's. Remains on carbidopa.   Chronic foley due to urinary retention. Follow by [facility] for monthly changes     Review of Systems:  Review of Systems  Constitutional: Negative.   HENT: Negative.    Eyes: Negative.   Respiratory:  Negative for cough, shortness of breath and wheezing.   Cardiovascular:  Negative for chest pain  and leg swelling.  Gastrointestinal:  Negative for constipation.  Genitourinary:  Negative for hematuria.       Urinary retention  Musculoskeletal:  Positive for falls and joint pain.  Skin: Negative.   Neurological:  Positive for tremors and weakness. Negative for dizziness and headaches.  Psychiatric/Behavioral:  Negative for depression and memory loss. The patient is not nervous/anxious.     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   Thrombocytopenia (HCC) 02/08/2021   Past Surgical History:  Procedure Laterality Date   FRACTURE SURGERY     age 1   TONSILLECTOMY     age 89   TOTAL HIP ARTHROPLASTY Left 09/14/2021   Procedure: TOTAL HIP ARTHROPLASTY ANTERIOR APPROACH;  Surgeon: Ollen Gross, MD;  Location: [facility];  Service: Orthopedics;  Laterality: Left;   TOTAL HIP ARTHROPLASTY Right 01/31/2023   Procedure: TOTAL HIP ARTHROPLASTY ANTERIOR APPROACH;  Surgeon: Cammy Copa, MD;  Location: [facility];  Service: Orthopedics;  Laterality: Right;   TOTAL KNEE ARTHROPLASTY Right 09/05/2021   Procedure: TOTAL KNEE ARTHROPLASTY;  Surgeon: Ollen Gross, MD;  Location: [facility];  Service: Orthopedics;  Laterality: Right;   WISDOM TOOTH EXTRACTION     Social History:   reports that he quit smoking about 32 years ago. His smoking use included cigarettes. He started smoking about 42 years ago. He has a 10 pack-year smoking history. He has never  used smokeless tobacco. He reports current alcohol use of about 1.0 - 2.0 standard drink of alcohol per week. He reports that he does not use drugs.  Family History  Adopted: Yes    Medications: Patient's Medications  New Prescriptions   No medications on file  Previous Medications   ACETAMINOPHEN (TYLENOL) 500 MG TABLET    Take 500 mg by mouth as needed for moderate pain.   ASPIRIN EC 81 MG TABLET    Take 1 tablet  (81 mg total) by mouth 2 (two) times daily. Swallow whole.   CALCIUM-VITAMIN D (OSCAL WITH D) 500-5 MG-MCG TABLET    Take 2 tablets by mouth 2 (two) times daily.   CARBIDOPA-LEVODOPA (SINEMET IR) 25-100 MG TABLET    Take 1.5 tablets by mouth 3 (three) times daily. 7am/11am/4pm   DOCUSATE SODIUM (COLACE) 100 MG CAPSULE    Take 1 capsule (100 mg total) by mouth 2 (two) times daily.   METHYLPREDNISOLONE (MEDROL DOSEPAK) 4 MG TBPK TABLET    Take as directed on package.   OXYCODONE (OXY IR/ROXICODONE) 5 MG IMMEDIATE RELEASE TABLET    Take 1 tablet (5 mg total) by mouth every 4 (four) hours as needed for moderate pain.   TAMSULOSIN (FLOMAX) 0.4 MG CAPS CAPSULE    Take 0.4 mg by mouth in the morning and at bedtime.  Modified Medications   No medications on file  Discontinued Medications   METHOCARBAMOL (ROBAXIN) 500 MG TABLET    Take 1 tablet (500 mg total) by mouth every 6 (six) hours as needed for muscle spasms.   TAMSULOSIN (FLOMAX) 0.4 MG CAPS CAPSULE    Take 1 capsule (0.4 mg total) by mouth daily.    Physical Exam:  Vitals:   02/15/23 1521  BP: 126/62  Pulse: 84  Resp: 16  Temp: 98.3 F (36.8 C)  SpO2: 97%  Weight: 144 lb 9.6 oz (65.6 kg)  Height: 5\' 7"  (1.702 m)   Body mass index is 22.65 kg/m. Wt Readings from Last 3 Encounters:  02/15/23 144 lb 9.6 oz (65.6 kg)  01/30/23 154 lb 5.2 oz (70 kg)  12/07/22 151 lb (68.5 kg)    Physical Exam Vitals reviewed.  Constitutional:      General: He is not in acute distress. HENT:     Head: Normocephalic.  Eyes:     General:        Right eye: No discharge.        Left eye: No discharge.  Cardiovascular:     Rate and Rhythm: Normal rate and regular rhythm.     Pulses: Normal pulses.     Heart sounds: Normal heart sounds.  Pulmonary:     Effort: Pulmonary effort is normal. No respiratory distress.     Breath sounds: Normal breath sounds. No wheezing.  Abdominal:     General: Bowel sounds are normal.     Palpations: Abdomen is  soft.  Musculoskeletal:     Cervical back: Neck supple.     Right lower leg: No edema.     Left lower leg: No edema.  Skin:    General: Skin is warm.     Capillary Refill: Capillary refill takes less than 2 seconds.  Neurological:     General: No focal deficit present.     Mental Status: He is alert and oriented to person, place, and time.     Motor: Weakness present.     Gait: Gait abnormal.     Comments: Resting hand tremor,  right foot tremor  Psychiatric:        Mood and Affect: Mood normal.     Labs reviewed: Basic Metabolic Panel: Recent Labs    10/15/22 0417 10/16/22 0425 10/16/22 0426 11/23/22 1341 01/30/23 2056 01/31/23 0438 02/01/23 0329  NA 139  --  139   < > 139 136 137  K 4.0  --  3.9   < > 4.6 4.1 4.3  CL 107  --  105   < > 104 104 104  CO2 25  --  26   < > 26 25 26   GLUCOSE 130*  --  117*   < > 120* 181* 157*  BUN 23  --  19   < > 24* 21 17  CREATININE 0.89  --  1.00   < > 1.18 1.12 0.85  CALCIUM 8.4*  --  8.0*   < > 9.0 8.7* 8.6*  MG  --   --   --   --  2.2  --   --   PHOS 2.1* 3.5 3.5  --   --   --   --    < > = values in this interval not displayed.   Liver Function Tests: Recent Labs    10/13/22 1027 10/14/22 0305 10/16/22 0426 11/23/22 1341 01/30/23 2056  AST 10*  --   --  13* 21  ALT 6  --   --  <5 8  ALKPHOS 183*  --   --  116 85  BILITOT 1.3*  --   --  0.8 1.1  PROT 7.6  --   --  6.0* 6.2*  ALBUMIN 4.2   < > 2.6* 3.5 3.9   < > = values in this interval not displayed.   No results for input(s): "LIPASE", "AMYLASE" in the last 8760 hours. No results for input(s): "AMMONIA" in the last 8760 hours. CBC: Recent Labs    10/13/22 1027 10/13/22 1033 11/23/22 1341 01/30/23 2056 01/31/23 0438 02/01/23 0329  WBC 145.9*   < > 48.4* 41.2* 42.5* 49.5*  NEUTROABS 3.7  --  2.4 2.3  --   --   HGB 13.0   < > 10.5* 12.6* 11.7* 12.1*  HCT 41.3   < > 33.0* 38.4* 36.6* 37.6*  MCV 96.3   < > 99.1 93.9 94.6 94.9  PLT 137*   < > 131* 101* 125*  100*   < > = values in this interval not displayed.   Lipid Panel: No results for input(s): "CHOL", "HDL", "LDLCALC", "TRIG", "CHOLHDL", "LDLDIRECT" in the last 8760 hours. TSH: No results for input(s): "TSH" in the last 8760 hours. A1C: No results found for: "HGBA1C"   Assessment/Plan 1. Closed fracture of right hip with routine healing, subsequent encounter - 08/20 tripped on curb going to doctor - Right hip xray revealed acute diplaced subcapital right femoral neck fracture - 08/21 he underwent RATH by Dr. August Saucer - 09/04 ortho f/u> CT right hip due to ongoing pain> question fluid pocket - 09/04 started on prednisone taper - started HHPT/OT this week - cont oxycodone prn for pain   2. 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 Arbutus Leas - cont carbidopa - DME Wheelchair manual  3. Urinary retention - followed by [facility] - unsuccessful voiding trials in past - chronic foley use - cont monthly changes  4. Frequent falls - 2 hip fractures in past 18 months> suspect osteoporosis - if mobility improves with PT consider bone  density in near future - advised to start Caltrate for bone health   Total time: 34 minutes. Greater than 50% of total time spent doing patient education regarding recent hip fracture, falls safety, Parkinson's and bone health including symptom/medication management.     Next appt: 05/17/2023  Hazle Nordmann, Juel Burrow  [facility] [phone]

## 2023-02-16 ENCOUNTER — Encounter: Payer: Self-pay | Admitting: Surgical

## 2023-02-16 NOTE — Progress Notes (Signed)
Post-Op Visit Note   Patient: Frederick Pham           Date of Birth: 03/27/1951           MRN: [id number] Visit Date: 02/14/2023 PCP: [name], NP   Assessment & Plan:  Chief Complaint:  Chief Complaint  Patient presents with   Right Hip - Follow-up    Right total hip arthroplasty 01/31/2023   Visit Diagnoses:  1. S/P total right hip arthroplasty     Plan: Patient is a 72 year old male who presents s/p right total hip arthroplasty on 01/31/2023.  He is about 2 weeks out from procedure.  He was initially doing very well following this procedure and actually was discharged from the hospital relatively quickly despite this being a hip replacement done for hip fracture.  He had 2 minor falls shortly after surgery but really had no increase in pain after these falls and was able to continue walking.  About 3 to 4 days after that last fall, he noticed fairly quick onset of right leg pain.  Does not localize to his groin.  He describes some mild low back pain that is no different than typical for him.  He has radiation of pain down from his right hip and buttock region that radiates all the way down to his foot.  He describes the sensation as an electrical shock.  It is worse when he walks and weightbears.  This pain is severe in intensity and is actually worse than his initial postsurgical pain and even worse than his pain from the hip fracture itself.  Takes Tylenol and oxycodone on at night only.  Has been ambulating with wheelchair.  No fevers or chills.  He is eating without difficulty.  On exam, patient has right hip incision anteriorly which is healing well without evidence of infection or dehiscence.  There is no erythema.  No expressible drainage.  No fluctuance.  He has no calf tenderness.  Negative Homans' sign bilaterally.  Intact EHL, dorsiflexion, plantarflexion, quad, hamstring, hip flexion bilaterally without weakness.  Does have increased pain with straight leg raise and hip  motion which reproduces pain shooting all the way down to the foot.  Radiographs taken today demonstrate no obvious fracture or dislocation.  There is really no change in appearance of the implants compared with prior radiographs from immediately after surgery.  However with the history of couple falls after surgery and the difficulty weightbearing, need CT scan of the right hip to rule out periprosthetic occult fracture.  This was negative for fracture or any difficulty with the hardware and only demonstrated small fluid collection which is likely postsurgical.  There is really no evidence of infection being the primary cause of his symptoms.  With the lack of groin pain and the radicular nature of his symptoms, he may have some contribution from lumbar spine pathology especially since his symptoms are so severe in intensity and associated with electrical shock sensation.  Plan to try Medrol Dosepak and he will reach out to the office on Monday or Tuesday of next week if his symptoms are no better.  He may weight-bear as he can tolerate but he understands that he needs to be careful based on his pain level to avoid having any more falls.  If he has not really any significant improvement after finishing the Medrol Dosepak, next step would likely be MRI of the lumbar spine.  Follow-Up Instructions: No follow-ups on file.   Orders:  Orders Placed This Encounter  Procedures   XR HIP UNILAT W OR W/O PELVIS 2-3 VIEWS RIGHT   CT HIP RIGHT WO CONTRAST   Meds ordered this encounter  Medications   methylPREDNISolone (MEDROL DOSEPAK) 4 MG TBPK tablet    Sig: Take as directed on package.    Dispense:  21 tablet    Refill:  0    Imaging: No results found.  PMFS History: Patient Active Problem List   Diagnosis Date Noted   Closed right hip fracture, initial encounter (HCC) 01/30/2023   Acute renal failure (ARF) (HCC) 10/13/2022   AKI (acute kidney injury) (HCC) 10/13/2022   Parkinsonism 10/13/2022    Acute metabolic encephalopathy 10/13/2022   Idiopathic neuropathy 05/25/2022   Fall 09/16/2021   Delirium 09/14/2021   Leukocytosis 09/14/2021   Anemia of chronic disease 09/13/2021   Swelling of right lower extremity 09/13/2021   Fracture of femoral neck, left (HCC) 09/12/2021   OA (osteoarthritis) of knee 09/05/2021   Osteoarthritis of right knee 09/05/2021   Basal cell carcinoma 08/03/2021   Atypical mole 04/22/2021   Chronic lymphocytic leukemia, Rai stage I (HCC) 02/08/2021   Thrombocytopenia (HCC) 02/08/2021   Osteoarthritis of right knee s/p TKA on 09/05/2021 01/14/2021   Nocturia 01/14/2021   Onychomycosis 01/14/2021   Tremor of left hand 01/14/2021   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   Thrombocytopenia (HCC) 02/08/2021    Family History  Adopted: Yes    Past Surgical History:  Procedure Laterality Date   FRACTURE SURGERY     age 19   TONSILLECTOMY     age 38   TOTAL HIP ARTHROPLASTY Left 09/14/2021   Procedure: TOTAL HIP ARTHROPLASTY ANTERIOR APPROACH;  Surgeon: Ollen Gross, MD;  Location: [facility];  Service: Orthopedics;  Laterality: Left;   TOTAL HIP ARTHROPLASTY Right 01/31/2023   Procedure: TOTAL HIP ARTHROPLASTY ANTERIOR APPROACH;  Surgeon: Cammy Copa, MD;  Location: [facility];  Service: Orthopedics;  Laterality: Right;   TOTAL KNEE ARTHROPLASTY Right 09/05/2021   Procedure: TOTAL KNEE ARTHROPLASTY;  Surgeon: Ollen Gross, MD;  Location: [facility];  Service: Orthopedics;  Laterality: Right;   WISDOM TOOTH EXTRACTION     Social History   Occupational History   Not on file  Tobacco Use   Smoking status: Former    Current packs/day: 0.00    Average packs/day: 1 pack/day for 10.0 years (10.0 ttl pk-yrs)    Types: Cigarettes    Start date: 10/07/1980    Quit date: 10/08/1990    Years since quitting: 32.3   Smokeless  tobacco: Never  Vaping Use   Vaping status: Never Used  Substance and Sexual Activity   Alcohol use: Yes    Alcohol/week: 1.0 - 2.0 standard drink of alcohol    Types: 1 - 2 Standard drinks or equivalent per week    Comment: weekly   Drug use: Never   Sexual activity: Not on file

## 2023-02-22 ENCOUNTER — Telehealth: Payer: Self-pay | Admitting: Hematology and Oncology

## 2023-02-22 ENCOUNTER — Inpatient Hospital Stay: Payer: Medicare Other | Attending: Hematology and Oncology

## 2023-02-26 ENCOUNTER — Inpatient Hospital Stay: Payer: Medicare Other | Attending: Hematology and Oncology

## 2023-02-26 ENCOUNTER — Ambulatory Visit: Payer: Medicare Other | Admitting: Neurology

## 2023-02-26 DIAGNOSIS — N400 Enlarged prostate without lower urinary tract symptoms: Secondary | ICD-10-CM | POA: Insufficient documentation

## 2023-02-26 DIAGNOSIS — C9110 Chronic lymphocytic leukemia of B-cell type not having achieved remission: Secondary | ICD-10-CM | POA: Diagnosis present

## 2023-02-27 LAB — PROSTATE-SPECIFIC AG, SERUM (LABCORP): Prostate Specific Ag, Serum: 2.9 ng/mL (ref 0.0–4.0)

## 2023-03-01 NOTE — Telephone Encounter (Signed)
Dont think I'd do another course of oral steroids so close to the last one.  I think I'd try just waiting this out and using topical anti-inflammtories and heat/ice.  We should check him back with Dr August Saucer in about 2-3 weeks I dont think he has a current appointment

## 2023-03-05 ENCOUNTER — Ambulatory Visit: Payer: Medicare Other | Admitting: Neurology

## 2023-03-08 ENCOUNTER — Encounter: Payer: Self-pay | Admitting: Orthopedic Surgery

## 2023-04-18 NOTE — Progress Notes (Unsigned)
Assessment/Plan:   1.  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  I suspect that this is the correct diagnosis.  Patient is significantly undertreated, with very severe rigidity on the left.  My guess is that he has had Parkinsons a lot longer than it has been diagnosed (May, 2023).             -***carbidopa/levodopa 25/100, 1.5 tablets at 7 AM/11 AM/4 PM.  Discussed potential interactions with protein.             -MRI brain due to confusion/paranoia and cognitive concerns by family (and witnessed today)     2.  Urinary retention             -following with [facility]   3.  CLL             -follows with Dr. Leonides Schanz   4.  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  I do not recommend driving.  5.  Hip fracture, August 2024  -Following with orthopedics   Subjective:   Frederick Pham was seen today in follow up for Parkinsons disease.  My previous records were reviewed prior to todays visit as well as outside records available to me.  Last visit, I did hypothesized that the patient had had Parkinsons disease much longer than being diagnosed.  He was severely undertreated.  However, I only slightly increased his levodopa, primarily because of memory change, confusion, aggression.  MRI brain was done because of this and just demonstrated advanced atrophy.  It was personally reviewed.  Neurocognitive testing was declined.  They did email [place] about several things, including sleep, and they were told to follow-up with primary care regarding sleep issues.  Separately, patient did fall in August, resulting in hip fracture requiring right hip hemiarthroplasty.  He was discharged home.  He had several falls following the hip surgery and went back to the orthopedic PA.  The x-rays of that hip were negative for fracture.  They did a CT of  the hip and the prosthesis looked good, but there was a fluid collection in the hip.  The PA called him and steroids.  Current prescribed movement disorder medications: carbidopa/levodopa 25/100, 1.5 tablets at 7 AM/11 AM/4 PM (increase)   PREVIOUS MEDICATIONS: {Parkinson's RX:18200}  ALLERGIES:   Allergies  Allergen Reactions   Morphine Other (See Comments)    " Caused extreme confusion and combativeness "     CURRENT MEDICATIONS:  No outpatient medications have been marked as taking for the 04/23/23 encounter (Appointment) with Aubrie Lucien, Octaviano Batty, DO.     Objective:   PHYSICAL EXAMINATION:    VITALS:  There were no vitals filed for this visit.  GEN:  The patient appears stated age and is in NAD. HEENT:  Normocephalic, atraumatic.  The mucous membranes are moist. The superficial temporal arteries are without ropiness or tenderness. CV:  RRR Lungs:  CTAB Neck/HEME:  There are no carotid bruits bilaterally.  Neurological examination:  Orientation: The patient is alert and oriented x3. Cranial nerves: There is good facial symmetry with*** facial hypomimia. The speech is fluent and clear. Soft palate rises symmetrically and there is no tongue deviation. Hearing is intact to conversational tone. Sensation: Sensation is  intact to light touch throughout Motor: Strength is at least antigravity x4.  Movement examination: Tone: There is mild increased tone in the RUE.  Tone in the LUE is mod/severe as it is in the LLE Abnormal movements: there is LUE rest tremor Coordination:  There is mild decremation with RAM's, with hand opening and closing and finger taps on the L as well as alternation of supination/pronation on the left. Gait and Station: The patient is not able to arise without the use of his hands.  The patient ambulates with a walker.  He is short stepped and drags the L leg.     I have reviewed and interpreted the following labs independently    Chemistry      Component  Value Date/Time   NA 137 02/01/2023 0329   K 4.3 02/01/2023 0329   CL 104 02/01/2023 0329   CO2 26 02/01/2023 0329   BUN 17 02/01/2023 0329   CREATININE 0.85 02/01/2023 0329   CREATININE 0.92 11/23/2022 1341      Component Value Date/Time   CALCIUM 8.6 (L) 02/01/2023 0329   ALKPHOS 85 01/30/2023 2056   AST 21 01/30/2023 2056   AST 13 (L) 11/23/2022 1341   ALT 8 01/30/2023 2056   ALT <5 11/23/2022 1341   BILITOT 1.1 01/30/2023 2056   BILITOT 0.8 11/23/2022 1341       Lab Results  Component Value Date   WBC 49.5 (H) 02/01/2023   HGB 12.1 (L) 02/01/2023   HCT 37.6 (L) 02/01/2023   MCV 94.9 02/01/2023   PLT 100 (L) 02/01/2023    No results found for: "TSH"   Total time spent on today's visit was ***30 minutes, including both face-to-face time and nonface-to-face time.  Time included that spent on review of records (prior notes available to me/labs/imaging if pertinent), discussing treatment and goals, answering patient's questions and coordinating care.  Cc:  [name], NP

## 2023-04-23 ENCOUNTER — Ambulatory Visit (INDEPENDENT_AMBULATORY_CARE_PROVIDER_SITE_OTHER): Payer: Medicare Other | Admitting: Neurology

## 2023-04-23 ENCOUNTER — Encounter: Payer: Self-pay | Admitting: Neurology

## 2023-04-23 VITALS — BP 102/60 | HR 65 | Ht 67.0 in | Wt 145.6 lb

## 2023-04-23 DIAGNOSIS — G20B1 Parkinson's disease with dyskinesia, without mention of fluctuations: Secondary | ICD-10-CM | POA: Diagnosis not present

## 2023-04-23 DIAGNOSIS — F0283 Dementia in other diseases classified elsewhere, unspecified severity, with mood disturbance: Secondary | ICD-10-CM | POA: Diagnosis not present

## 2023-04-23 DIAGNOSIS — G20A1 Parkinson's disease without dyskinesia, without mention of fluctuations: Secondary | ICD-10-CM

## 2023-04-23 NOTE — Patient Instructions (Signed)
Good to see you today!  The physicians and staff at [facility] are committed to providing excellent care. You may receive a survey requesting feedback about your experience at our office. We strive to receive "very good" responses to the survey questions. If you feel that your experience would prevent you from giving the office a "very good " response, please contact our office to try to remedy the situation. We may be reached at [phone]. Thank you for taking the time out of your busy day to complete the survey.     

## 2023-04-26 ENCOUNTER — Other Ambulatory Visit: Payer: Self-pay | Admitting: Neurology

## 2023-04-27 ENCOUNTER — Encounter: Payer: Self-pay | Admitting: Orthopedic Surgery

## 2023-05-02 ENCOUNTER — Encounter: Payer: Self-pay | Admitting: Orthopedic Surgery

## 2023-05-17 ENCOUNTER — Telehealth: Payer: Medicare Other | Admitting: Orthopedic Surgery

## 2023-05-23 ENCOUNTER — Other Ambulatory Visit: Payer: Self-pay | Admitting: Hematology and Oncology

## 2023-05-23 DIAGNOSIS — C9110 Chronic lymphocytic leukemia of B-cell type not having achieved remission: Secondary | ICD-10-CM

## 2023-05-24 ENCOUNTER — Inpatient Hospital Stay: Payer: Medicare Other | Attending: Hematology and Oncology

## 2023-05-24 ENCOUNTER — Inpatient Hospital Stay (HOSPITAL_BASED_OUTPATIENT_CLINIC_OR_DEPARTMENT_OTHER): Payer: Medicare Other | Admitting: Hematology and Oncology

## 2023-05-24 VITALS — BP 150/64 | HR 79 | Temp 98.6°F | Resp 16 | Wt 146.0 lb

## 2023-05-24 DIAGNOSIS — D696 Thrombocytopenia, unspecified: Secondary | ICD-10-CM | POA: Diagnosis not present

## 2023-05-24 DIAGNOSIS — D72820 Lymphocytosis (symptomatic): Secondary | ICD-10-CM

## 2023-05-24 DIAGNOSIS — C9110 Chronic lymphocytic leukemia of B-cell type not having achieved remission: Secondary | ICD-10-CM | POA: Insufficient documentation

## 2023-05-24 LAB — CBC WITH DIFFERENTIAL (CANCER CENTER ONLY)
Abs Immature Granulocytes: 0.01 10*3/uL (ref 0.00–0.07)
Basophils Absolute: 0.1 10*3/uL (ref 0.0–0.1)
Basophils Relative: 0 %
Eosinophils Absolute: 0.1 10*3/uL (ref 0.0–0.5)
Eosinophils Relative: 0 %
HCT: 37.9 % — ABNORMAL LOW (ref 39.0–52.0)
Hemoglobin: 12.7 g/dL — ABNORMAL LOW (ref 13.0–17.0)
Immature Granulocytes: 0 %
Lymphocytes Relative: 95 %
Lymphs Abs: 36 10*3/uL — ABNORMAL HIGH (ref 0.7–4.0)
MCH: 31.6 pg (ref 26.0–34.0)
MCHC: 33.5 g/dL (ref 30.0–36.0)
MCV: 94.3 fL (ref 80.0–100.0)
Monocytes Absolute: 0.1 10*3/uL (ref 0.1–1.0)
Monocytes Relative: 0 %
Neutro Abs: 1.9 10*3/uL (ref 1.7–7.7)
Neutrophils Relative %: 5 %
Platelet Count: 102 10*3/uL — ABNORMAL LOW (ref 150–400)
RBC: 4.02 MIL/uL — ABNORMAL LOW (ref 4.22–5.81)
RDW: 14.8 % (ref 11.5–15.5)
Smear Review: NORMAL
WBC Count: 38.1 10*3/uL — ABNORMAL HIGH (ref 4.0–10.5)
nRBC: 0 % (ref 0.0–0.2)

## 2023-05-24 LAB — CMP (CANCER CENTER ONLY)
ALT: 14 U/L (ref 0–44)
AST: 14 U/L — ABNORMAL LOW (ref 15–41)
Albumin: 4.3 g/dL (ref 3.5–5.0)
Alkaline Phosphatase: 76 U/L (ref 38–126)
Anion gap: 5 (ref 5–15)
BUN: 25 mg/dL — ABNORMAL HIGH (ref 8–23)
CO2: 31 mmol/L (ref 22–32)
Calcium: 9.4 mg/dL (ref 8.9–10.3)
Chloride: 106 mmol/L (ref 98–111)
Creatinine: 1.16 mg/dL (ref 0.61–1.24)
GFR, Estimated: >60 mL/min (ref >60)
Glucose, Bld: 112 mg/dL — ABNORMAL HIGH (ref 70–99)
Potassium: 4.4 mmol/L (ref 3.5–5.1)
Sodium: 142 mmol/L (ref 135–145)
Total Bilirubin: 0.9 mg/dL (ref <1.2)
Total Protein: 6.4 g/dL — ABNORMAL LOW (ref 6.5–8.1)

## 2023-05-24 LAB — LACTATE DEHYDROGENASE: LDH: 96 U/L — ABNORMAL LOW (ref 98–192)

## 2023-05-24 NOTE — Progress Notes (Signed)
[facility] [phone]   [phone]  PROGRESS NOTE  Patient Care Team: [name], NP as PCP - General (Adult Health Nurse Practitioner) Edwin Cap, DPM as Consulting Physician (Podiatry) Ollen Gross, MD as Consulting Physician (Orthopedic Surgery) Tat, Octaviano Batty, DO as Consulting Physician (Neurology)  Hematological/Oncological History # Chronic Lymphocytic Leukemia, Rai Stage I 1) 01/21/2021: WBC 39.8 (H), Hgb 14.2, Plt 131 (L)  2) 02/08/2021: Establish care with Georga Kaufmann PA-C. WBC: 46.7, Hgb 14.1, Plt 113K. ALC 45,900. Flow cytometry confirms a monoclonal B cell population consistent with CLL.  3) 02/18/2021: CLL prognostic panel results show normal cytogenetics, intermediate risk. IgVH positive. CT C/A/P showed enlarged retroperitoneal, portacaval, bilateral iliac, and pelvic sidewall lymph nodes, no evidence of splenomegaly.  05/26/2021: WBC 48.8, Hgb 13.3, MCV 95, Plt 90 08/24/2021: WBC 60.8, Hgb 13.6, MCV 94.5, Plt 108 11/24/2021: WBC 41.8, Hgb 12.9, MCV 91.8, Plt 122 05/24/2022: WBC 59.0, Hgb 13.7, MCV 95.2, Plt 101  Interval History:  Gene Colee A Steinhardt 72 y.o. male with medical history significant for newly diagnosed CLL who presents for a follow up visit. The patient's last visit was on 11/22/2021. In the interim since the last visit he underwent hip surgery in August 2024.  On exam today Mr. Wysinger reports he did not sleep well last night.  He is unsure why.  He reports that in the interim since her last visit he has had no hospitalizations, ER visits, or new medications.  He did however have a hip surgery performed in August 2024.  He reports overall his energy is okay and his appetite is good.  He has not noticed any bumps or lumps concerning for lymphadenopathy.  He also is not having any B symptoms such as fevers, chills, sweats, nausea, vomiting or diarrhea.  Overall he is stable at his baseline level of health.  The 10 point ROS  is below.  MEDICAL HISTORY:  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   Thrombocytopenia (HCC) 02/08/2021    SURGICAL HISTORY: Past Surgical History:  Procedure Laterality Date   FRACTURE SURGERY     age 8   TONSILLECTOMY     age 70   TOTAL HIP ARTHROPLASTY Left 09/14/2021   Procedure: TOTAL HIP ARTHROPLASTY ANTERIOR APPROACH;  Surgeon: Ollen Gross, MD;  Location: [facility];  Service: Orthopedics;  Laterality: Left;   TOTAL HIP ARTHROPLASTY Right 01/31/2023   Procedure: TOTAL HIP ARTHROPLASTY ANTERIOR APPROACH;  Surgeon: Cammy Copa, MD;  Location: [facility];  Service: Orthopedics;  Laterality: Right;   TOTAL KNEE ARTHROPLASTY Right 09/05/2021   Procedure: TOTAL KNEE ARTHROPLASTY;  Surgeon: Ollen Gross, MD;  Location: [facility];  Service: Orthopedics;  Laterality: Right;   WISDOM TOOTH EXTRACTION      SOCIAL HISTORY: Social History   Socioeconomic History   Marital status: Married    Spouse name: Not on file   Number of children: Not on file   Years of education: Not on file   Highest education level: Not on file  Occupational History   Not on file  Tobacco Use   Smoking status: Former    Current packs/day: 0.00    Average packs/day: 1 pack/day for 10.0 years (10.0 ttl pk-yrs)    Types: Cigarettes    Start date: 10/07/1980    Quit date: 10/08/1990    Years since  quitting: 32.6   Smokeless tobacco: Never  Vaping Use   Vaping status: Never Used  Substance and Sexual Activity   Alcohol use: Yes    Alcohol/week: 1.0 - 2.0 standard drink of alcohol    Types: 1 - 2 Standard drinks or equivalent per week    Comment: weekly   Drug use: Never   Sexual activity: Not on file  Other Topics Concern   Not on file  Social History Narrative   Diet:      Caffeine: Yes      Married, if yes what year: Yes, 1977      Do you live in a house,  apartment, assisted living, condo, trailer, ect: House      Is it one or more stories: 3      How many persons live in your home? 2      Pets: No      Highest level or education completed: 15      Current/Past profession: Art gallery manager, Teacher, English as a foreign language      Exercise: Yes                 Type and how often: weights- normally 5 days/week         Living Will: No   DNR: No   POA/HPOA: No      Functional Status:   Do you have difficulty bathing or dressing yourself? No   Do you have difficulty preparing food or eating? No   Do you have difficulty managing your medications? No   Do you have difficulty managing your finances? No   Do you have difficulty affording your medications? No   Right handed    Social Drivers of Health   Financial Resource Strain: Low Risk  (10/03/2022)   Overall Financial Resource Strain (CARDIA)    Difficulty of Paying Living Expenses: Not hard at all  Food Insecurity: No Food Insecurity (01/31/2023)   Hunger Vital Sign    Worried About Running Out of Food in the Last Year: Never true    Ran Out of Food in the Last Year: Never true  Transportation Needs: No Transportation Needs (01/31/2023)   PRAPARE - Administrator, Civil Service (Medical): No    Lack of Transportation (Non-Medical): No  Physical Activity: Unknown (10/03/2022)   Exercise Vital Sign    Days of Exercise per Week: 0 days    Minutes of Exercise per Session: Not on file  Stress: Patient Declined (10/03/2022)   [employer] of Occupational Health - Occupational Stress Questionnaire    Feeling of Stress : Patient declined  Social Connections: Unknown (10/03/2022)   Social Connection and Isolation Panel [NHANES]    Frequency of Communication with Friends and Family: Patient declined    Frequency of Social Gatherings with Friends and Family: Patient declined    Attends Religious Services: Patient declined    Database administrator or Organizations: No    Attends Engineer, structural:  Not on file    Marital Status: Married  Catering manager Violence: Not At Risk (01/31/2023)   Humiliation, Afraid, Rape, and Kick questionnaire    Fear of Current or Ex-Partner: No    Emotionally Abused: No    Physically Abused: No    Sexually Abused: No    FAMILY HISTORY: Family History  Adopted: Yes    ALLERGIES:  is allergic to morphine.  MEDICATIONS:  Current Outpatient Medications  Medication Sig Dispense Refill   acetaminophen (TYLENOL) 500 MG tablet Take  500 mg by mouth as needed for moderate pain.     aspirin EC 81 MG tablet Take 1 tablet (81 mg total) by mouth 2 (two) times daily. Swallow whole. 30 tablet 12   calcium-vitamin D (OSCAL WITH D) 500-5 MG-MCG tablet Take 2 tablets by mouth 2 (two) times daily. 120 tablet 0   carbidopa-levodopa (SINEMET IR) 25-100 MG tablet TAKE 1 1/2 TABLETS BY MOUTH 3 TIMES DAILY. 7AM/11AM/4PM 405 tablet 0   tamsulosin (FLOMAX) 0.4 MG CAPS capsule Take 0.4 mg by mouth in the morning and at bedtime.     No current facility-administered medications for this visit.    REVIEW OF SYSTEMS:   Constitutional: ( - ) fevers, ( - )  chills , ( - ) night sweats Eyes: ( - ) blurriness of vision, ( - ) double vision, ( - ) watery eyes Ears, nose, mouth, throat, and face: ( - ) mucositis, ( - ) sore throat Respiratory: ( - ) cough, ( - ) dyspnea, ( - ) wheezes Cardiovascular: ( - ) palpitation, ( - ) chest discomfort, ( - ) lower extremity swelling Gastrointestinal:  ( - ) nausea, ( - ) heartburn, ( - ) change in bowel habits Skin: ( - ) abnormal skin rashes Lymphatics: ( - ) new lymphadenopathy, ( - ) easy bruising Neurological: ( - ) numbness, ( - ) tingling, ( - ) new weaknesses Behavioral/Psych: ( - ) mood change, ( - ) new changes  All other systems were reviewed with the patient and are negative.  PHYSICAL EXAMINATION: ECOG PERFORMANCE STATUS: 0 - Asymptomatic  Vitals:   05/24/23 1114  BP: (!) 150/64  Pulse: 79  Resp: 16  Temp: 98.6 F  (37 C)  SpO2: 100%   Filed Weights   05/24/23 1114  Weight: 146 lb (66.2 kg)    GENERAL: Well-appearing elderly Caucasian male, alert, no distress and comfortable SKIN: skin color, texture, turgor are normal, no rashes or significant lesions EYES: conjunctiva are pink and non-injected, sclera clear NECK: supple, non-tender LYMPH:  no palpable lymphadenopathy in the cervical, axillary or inguinal LUNGS: clear to auscultation and percussion with normal breathing effort HEART: regular rate & rhythm and no murmurs and no lower extremity edema Musculoskeletal: no cyanosis of digits and no clubbing  PSYCH: alert & oriented x 3, fluent speech NEURO: no focal motor/sensory deficits  LABORATORY DATA:  I have reviewed the data as listed    Latest Ref Rng & Units 05/24/2023   11:01 AM 02/01/2023    3:29 AM 01/31/2023    4:38 AM  CBC  WBC 4.0 - 10.5 K/uL 38.1  49.5  42.5   Hemoglobin 13.0 - 17.0 g/dL 16.1  09.6  04.5   Hematocrit 39.0 - 52.0 % 37.9  37.6  36.6   Platelets 150 - 400 K/uL 102  100  125        Latest Ref Rng & Units 05/24/2023   11:01 AM 02/01/2023    3:29 AM 01/31/2023    4:38 AM  CMP  Glucose 70 - 99 mg/dL 409  811  914   BUN 8 - 23 mg/dL 25  17  21    Creatinine 0.61 - 1.24 mg/dL 7.82  9.56  2.13   Sodium 135 - 145 mmol/L 142  137  136   Potassium 3.5 - 5.1 mmol/L 4.4  4.3  4.1   Chloride 98 - 111 mmol/L 106  104  104   CO2 22 - 32 mmol/L  31  26  25    Calcium 8.9 - 10.3 mg/dL 9.4  8.6  8.7   Total Protein 6.5 - 8.1 g/dL 6.4     Total Bilirubin <1.2 mg/dL 0.9     Alkaline Phos 38 - 126 U/L 76     AST 15 - 41 U/L 14     ALT 0 - 44 U/L 14       RADIOGRAPHIC STUDIES: No results found.  ASSESSMENT & PLAN Kyler A Duskin 72 y.o. male with medical history significant for newly diagnosed CLL who presents for a follow up visit.   At this time the patient's findings are most consistent with a Rai stage I CLL.  Based on the prognostic panel he has intermediate risk  based on cytogenetics but does have a positive IGVH mutation which portends a good prognosis.    # Chronic Lymphocytic Leukemia, Rai Stage I --patient is Stage I based on lymphocytosis and lymphadenopathy --prognostic panel shows normal cytogenetics, intermediate risk. 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  -- No indication to treat based on today's labs.  Leukocytosis is stable and patient has only mild cytopenias..  -- Labs today show white blood cell count 38.1, hemoglobin 12.7, MCV 94.3, platelets 102 --recommend continued observation with a return clinic visit in 6 months time.   No orders of the defined types were placed in this encounter.   All questions were answered. The patient knows to call the clinic with any problems, questions or concerns.  I have spent a total of 30 minutes minutes of face-to-face and non-face-to-face time, preparing to see the patient, performing a medically appropriate examination, counseling and educating the patient, ordering tests, documenting clinical information in the electronic health record, and care coordination.   Ulysees Barns, MD Department of Hematology/Oncology [facility] Phone: [phone] Pager: [phone] Email: [email or web address]  05/24/2023 4:53 PM

## 2023-05-31 ENCOUNTER — Encounter: Payer: Self-pay | Admitting: Orthopedic Surgery

## 2023-05-31 ENCOUNTER — Telehealth: Payer: Medicare Other | Admitting: Orthopedic Surgery

## 2023-05-31 DIAGNOSIS — R339 Retention of urine, unspecified: Secondary | ICD-10-CM | POA: Diagnosis not present

## 2023-05-31 DIAGNOSIS — G20B1 Parkinson's disease with dyskinesia, without mention of fluctuations: Secondary | ICD-10-CM

## 2023-05-31 DIAGNOSIS — C9110 Chronic lymphocytic leukemia of B-cell type not having achieved remission: Secondary | ICD-10-CM | POA: Diagnosis not present

## 2023-05-31 NOTE — Progress Notes (Signed)
Careteam: Patient Care Team: [name], NP as PCP - General (Adult Health Nurse Practitioner) Edwin Cap, DPM as Consulting Physician (Podiatry) Ollen Gross, MD as Consulting Physician (Orthopedic Surgery) Tat, Octaviano Batty, DO as Consulting Physician (Neurology)  Seen by: Hazle Nordmann, AGNP-C  PLACE OF SERVICE:  [facility]  Advanced Directive information Does Patient Have a Medical Advance Directive?: Yes, Type of Advance Directive: Healthcare Power of [place];Living will, Does patient want to make changes to medical advance directive?: No - Patient declined  Allergies  Allergen Reactions   Morphine Other (See Comments)    " Caused extreme confusion and combativeness "     Chief Complaint  Patient presents with   Medical Management of Chronic Issues    3 month follow up.    Immunizations    Discuss the need for [employer].    Health Maintenance    Discuss the need for AWV, Hepatitis C Screening, and Colonoscopy.      HPI: Patient is a 72 y.o. male seen today via video visit for management of chronic conditions.   Followed by Dr. Arbutus Leas for Parkinson's. Remains on carbidopa. No recent falls. He has completed multiple rounds of HH PT. Wife is trying to find PT that will perform continuous therapy for weakness prevention. She has not been able to PT that will offer those services. They have tried multiple therapy companies, but he will eventually be discharged from their case load because" goals have been met."   Followed by Dr. Leonides Schanz for CLL> no progression at this time> f/u in 6 months.   Followed by urology due to ongoing urinary retention. He has permanent indwelling catheter. Remains on tamsulosin. Catheter bothers him at night and will interrupt sleep. They have tried several stabilizing straps without success. Also tried melatonin without improvement.   Weight ranging from 147-150 lbs.    Review of Systems:  Review of Systems  Constitutional: Negative.    HENT: Negative.    Eyes: Negative.   Respiratory: Negative.    Cardiovascular: Negative.   Gastrointestinal: Negative.   Genitourinary:        Retention  Musculoskeletal:  Negative for falls and joint pain.  Skin: Negative.   Neurological:  Positive for weakness.  Psychiatric/Behavioral:  Negative for depression. The patient has insomnia. The patient is not nervous/anxious.     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   Thrombocytopenia (HCC) 02/08/2021   Past Surgical History:  Procedure Laterality Date   FRACTURE SURGERY     age 59   TONSILLECTOMY     age 48   TOTAL HIP ARTHROPLASTY Left 09/14/2021   Procedure: TOTAL HIP ARTHROPLASTY ANTERIOR APPROACH;  Surgeon: Ollen Gross, MD;  Location: [facility];  Service: Orthopedics;  Laterality: Left;   TOTAL HIP ARTHROPLASTY Right 01/31/2023   Procedure: TOTAL HIP ARTHROPLASTY ANTERIOR APPROACH;  Surgeon: Cammy Copa, MD;  Location: [facility];  Service: Orthopedics;  Laterality: Right;   TOTAL KNEE ARTHROPLASTY Right 09/05/2021   Procedure: TOTAL KNEE ARTHROPLASTY;  Surgeon: Ollen Gross, MD;  Location: [facility];  Service: Orthopedics;  Laterality: Right;   [place] TOOTH EXTRACTION     Social History:   reports that he quit smoking about 32 years ago. His smoking use included cigarettes. He started smoking about 42 years ago. He has a 10 pack-year smoking history. He  has never used smokeless tobacco. He reports current alcohol use of about 1.0 standard drink of alcohol per week. He reports that he does not use drugs.  Family History  Adopted: Yes    Medications: Patient's Medications  New Prescriptions   No medications on file  Previous Medications   ACETAMINOPHEN (TYLENOL) 500 MG TABLET    Take 500 mg by mouth as needed for moderate pain.   ASPIRIN EC 81 MG TABLET    Take 1 tablet (81 mg total) by  mouth 2 (two) times daily. Swallow whole.   CALCIUM-VITAMIN D (OSCAL WITH D) 500-5 MG-MCG TABLET    Take 2 tablets by mouth 2 (two) times daily.   CARBIDOPA-LEVODOPA (SINEMET IR) 25-100 MG TABLET    TAKE 1 1/2 TABLETS BY MOUTH 3 TIMES DAILY. 7AM/11AM/4PM   TAMSULOSIN (FLOMAX) 0.4 MG CAPS CAPSULE    Take 0.4 mg by mouth in the morning and at bedtime.  Modified Medications   No medications on file  Discontinued Medications   No medications on file    Physical Exam:  There were no vitals filed for this visit. There is no height or weight on file to calculate BMI. Wt Readings from Last 3 Encounters:  05/24/23 146 lb (66.2 kg)  04/23/23 145 lb 9.6 oz (66 kg)  02/15/23 144 lb 9.6 oz (65.6 kg)    Physical Exam Vitals (exam limited due to video visit) reviewed.  Constitutional:      General: He is not in acute distress. Neurological:     Mental Status: He is alert.     Labs reviewed: Basic Metabolic Panel: Recent Labs    10/15/22 0417 10/16/22 0425 10/16/22 0426 11/23/22 1341 01/30/23 2056 01/31/23 0438 02/01/23 0329 05/24/23 1101  NA 139  --  139   < > 139 136 137 142  K 4.0  --  3.9   < > 4.6 4.1 4.3 4.4  CL 107  --  105   < > 104 104 104 106  CO2 25  --  26   < > 26 25 26 31   GLUCOSE 130*  --  117*   < > 120* 181* 157* 112*  BUN 23  --  19   < > 24* 21 17 25*  CREATININE 0.89  --  1.00   < > 1.18 1.12 0.85 1.16  CALCIUM 8.4*  --  8.0*   < > 9.0 8.7* 8.6* 9.4  MG  --   --   --   --  2.2  --   --   --   PHOS 2.1* 3.5 3.5  --   --   --   --   --    < > = values in this interval not displayed.   Liver Function Tests: Recent Labs    11/23/22 1341 01/30/23 2056 05/24/23 1101  AST 13* 21 14*  ALT 5 8 14   ALKPHOS 116 85 76  BILITOT 0.8 1.1 0.9  PROT 6.0* 6.2* 6.4*  ALBUMIN 3.5 3.9 4.3   No results for input(s): "LIPASE", "AMYLASE" in the last 8760 hours. No results for input(s): "AMMONIA" in the last 8760 hours. CBC: Recent Labs    11/23/22 1341  01/30/23 2056 01/31/23 0438 02/01/23 0329 05/24/23 1101  WBC 48.4* 41.2* 42.5* 49.5* 38.1*  NEUTROABS 2.4 2.3  --   --  1.9  HGB 10.5* 12.6* 11.7* 12.1* 12.7*  HCT 33.0* 38.4* 36.6* 37.6* 37.9*  MCV 99.1 93.9 94.6 94.9 94.3  PLT 131* 101* 125* 100* 102*   Lipid Panel: No results for input(s): "CHOL", "HDL", "LDLCALC", "TRIG", "CHOLHDL", "LDLDIRECT" in the last 8760 hours. TSH: No results for input(s): "TSH" in the last 8760 hours. A1C: No results found for: "HGBA1C"   Assessment/Plan 1. 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 Arbutus Leas - cont cardidopa - no recent falls - wife trying to find continuous PT for weakness prevention  2. CLL (chronic lymphocytic leukemia) (HCC) - followed by Dr. Leonides Schanz - stable at this time> no progression - f/u in 6 months   3. Urinary retention - ongoing - permanent indwelling catheter - sometimes interrupting sleep - tried different anchor straps without success - cont tamsulosin   Total time: 15 minutes. Greater than 50% of total time spent doing patient education regarding Parkinson's, CLL and urinary retention including symptom/medication management.    Virtual Visit  I connected with Frederick Pham by virtual visit and verified that I am speaking with the correct person using two identifiers.  Location: [facility] Patient: Frederick Pham Provider: [name], NP    I discussed the limitations, risks, security and privacy concerns of performing an evaluation and management service by telephone and the availability of in person appointments. I also discussed with the patient that there may be a patient responsible charge related to this service. The patient expressed understanding and agreed to proceed.   I discussed the assessment and treatment plan with the patient. The patient was provided an opportunity to ask questions and all were answered. The  patient agreed with the plan and demonstrated an understanding of the instructions.   The patient was advised to call back or seek an in-person evaluation if the symptoms worsen or if the condition fails to improve as anticipated.  I provided 15 minutes of face-to-face time during this encounter.  Morene Cecilio Norval Gable, NP  Avs printed and mailed   Next appt: Visit date not found  Kendall Arnell Scherry Ran  [facility] [phone]

## 2023-05-31 NOTE — Progress Notes (Signed)
   This service is provided via telemedicine  No vital signs collected/recorded due to the encounter was a telemedicine visit.   Location of patient (ex: home, work):  Home  Patient consents to a telephone visit:  Yes  Location of the provider (ex: office, home):  [employer].   Name of any referring provider:  [name], NP   Names of all persons participating in the telemedicine service and their role in the encounter:  Patient, Meda Klinefelter, RMA, Ngetich, Carilyn Goodpasture, NP.    Time spent on call: 8 minutes spent on the phone with Medical Assistant.

## 2023-06-01 ENCOUNTER — Telehealth: Payer: Self-pay

## 2023-06-01 NOTE — Telephone Encounter (Signed)
The paper is an office form for walk in patients who have concerns. She said that he cannot come in because he's in [place]. I requested urine sample and she said "Ok, Thank you" and hung up.

## 2023-06-01 NOTE — Telephone Encounter (Signed)
Patient wife came to office and dropped off paper about her husband. Paper states "Suspect UTI, please call in antibiotic". I called and spoke to wife she states that he's in [facility] and cannot come in for appointment today. I told her that we need a urine specimen. She said "Ok Thank you" and hung up the phone. Message sent to PCP [name], NP as Lorain Childes.

## 2023-06-01 NOTE — Telephone Encounter (Signed)
Did the paperwork ave any lab results? Who was paperwork from? He is followed by [facility] due to indwelling catheter/urinary retention. Catheters can cause increased risk for UTI. She can either drop off urine sample at our office or contact them for assistance.

## 2023-07-26 ENCOUNTER — Other Ambulatory Visit: Payer: Self-pay | Admitting: Neurology

## 2023-07-26 DIAGNOSIS — G20B1 Parkinson's disease with dyskinesia, without mention of fluctuations: Secondary | ICD-10-CM

## 2023-07-26 IMAGING — XA DG HIP (WITH OR WITHOUT PELVIS) 1V PORT*L*
1 series · 5 of 5 positions shown · non-contrast
Comparison: Radiographs 09/12/2021

CLINICAL DATA: Left hip fracture.

EXAM:
DG HIP (WITH OR WITHOUT PELVIS) 1V PORT LEFT

[Series 1: unknown protocol · 5 of 5 slices shown]
[im 1/5]
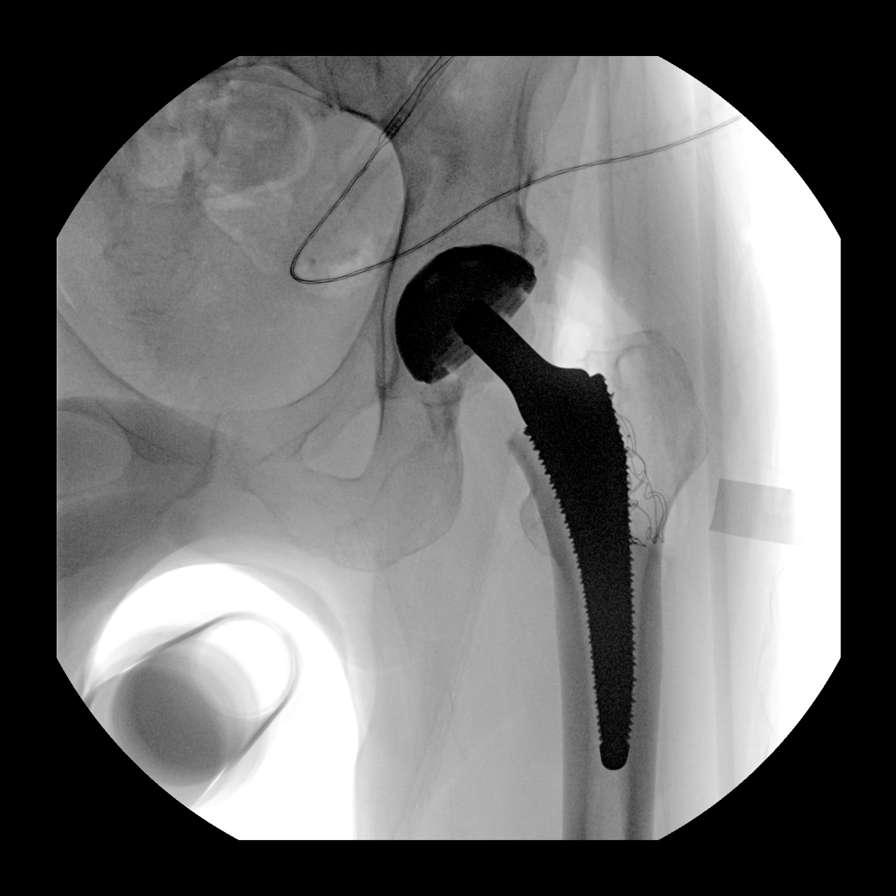
[im 2/5]
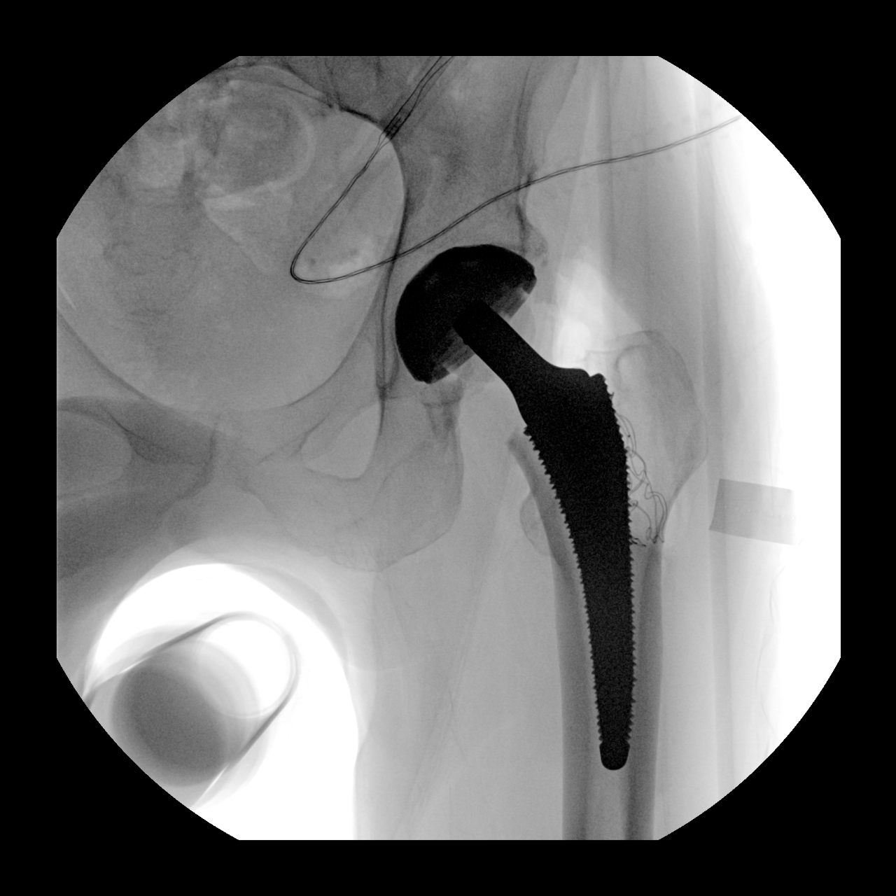
[im 3/5]
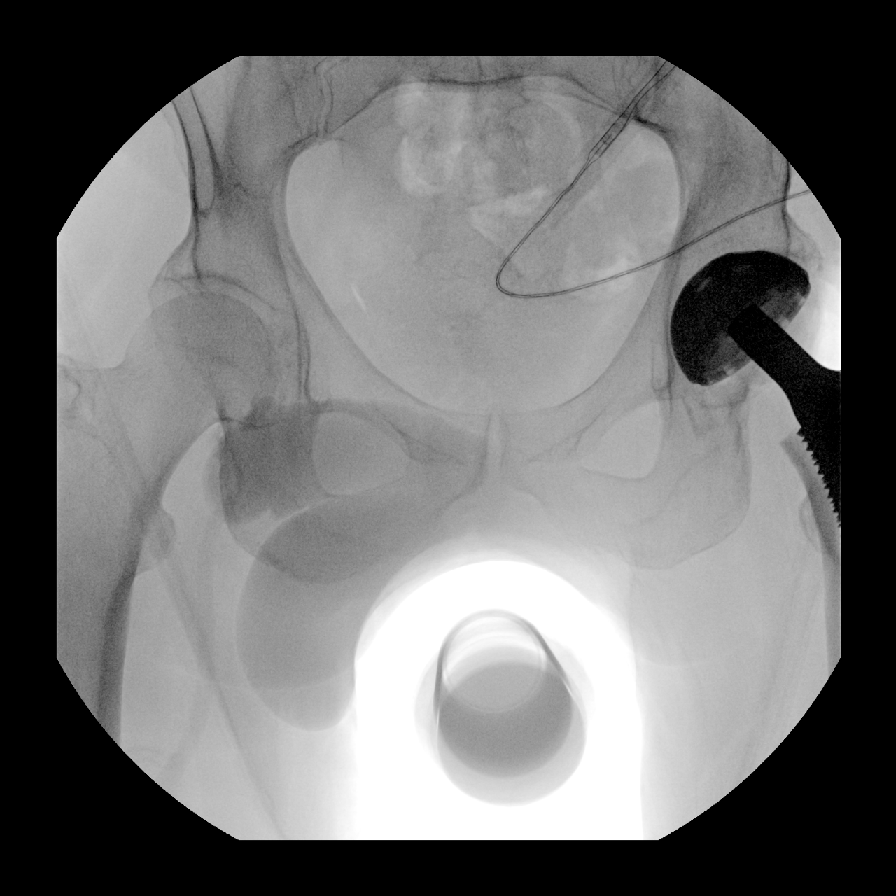
[im 4/5]
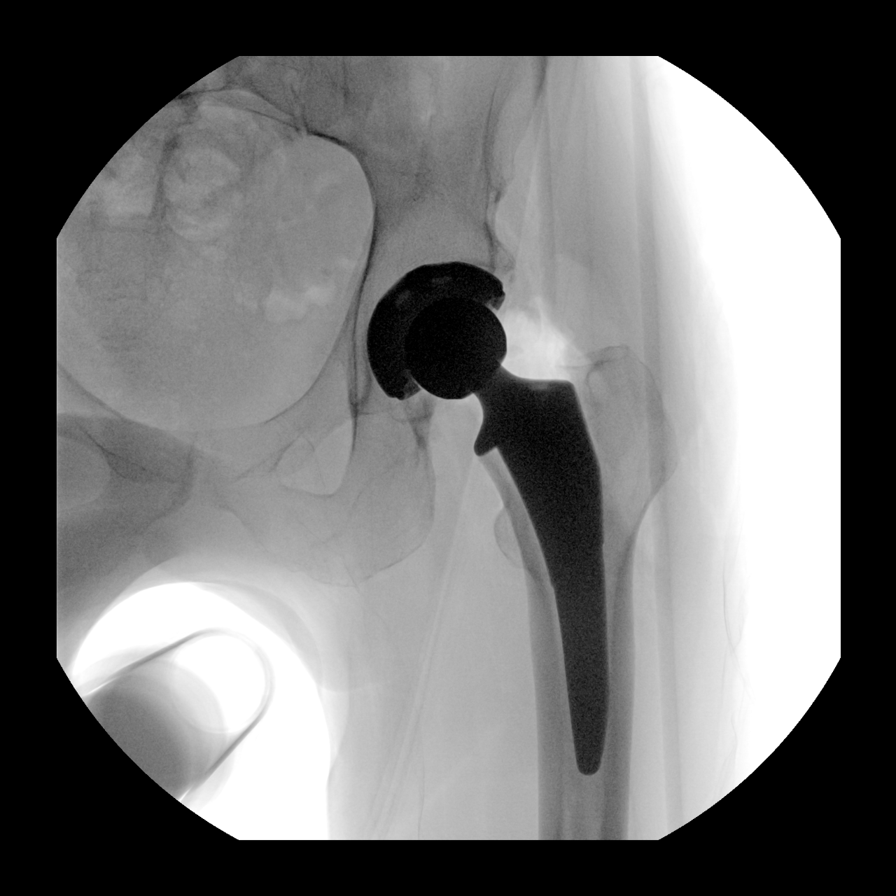
[im 5/5]
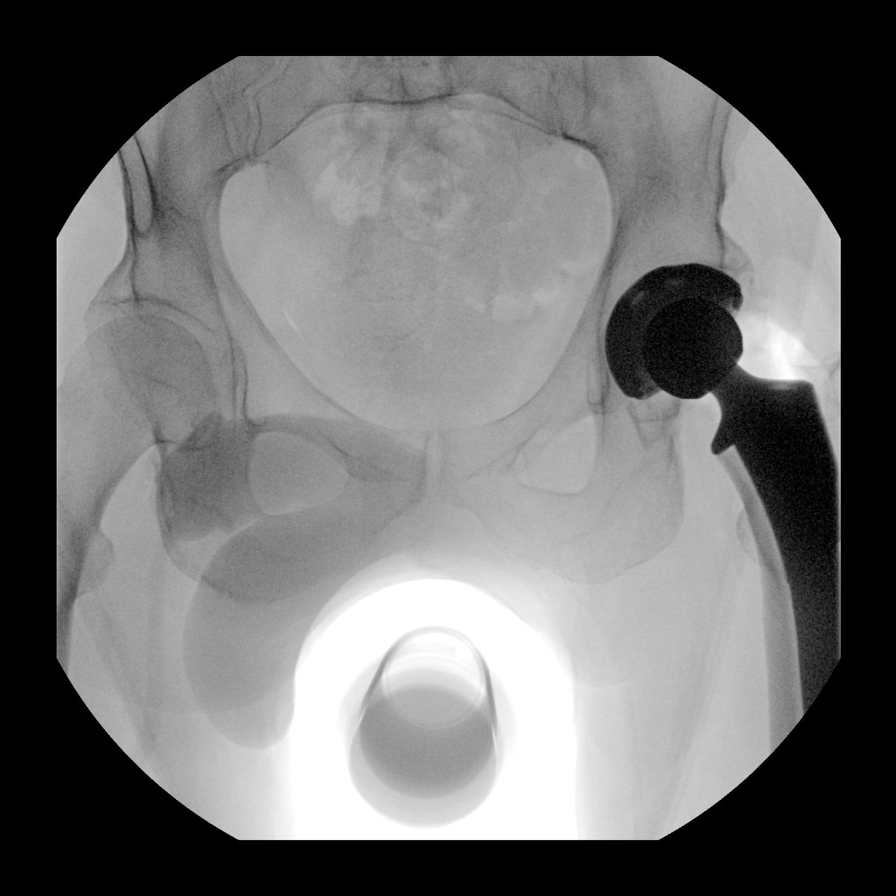

[5 of 5 positions shown; findings below may reference images not displayed]

FINDINGS: Intraoperative spot films demonstrate placement of a total left hip
arthroplasty. The components are well seated. No complicating
features are identified.
IMPRESSION: Well seated components of a total left hip arthroplasty.

## 2023-07-26 IMAGING — DX DG PORTABLE PELVIS
1 series · 1 of 1 positions shown · non-contrast
Comparison: None.

CLINICAL DATA: Status post left hip arthroplasty

EXAM:
PORTABLE PELVIS 1-2 VIEWS

[pelvis ap]
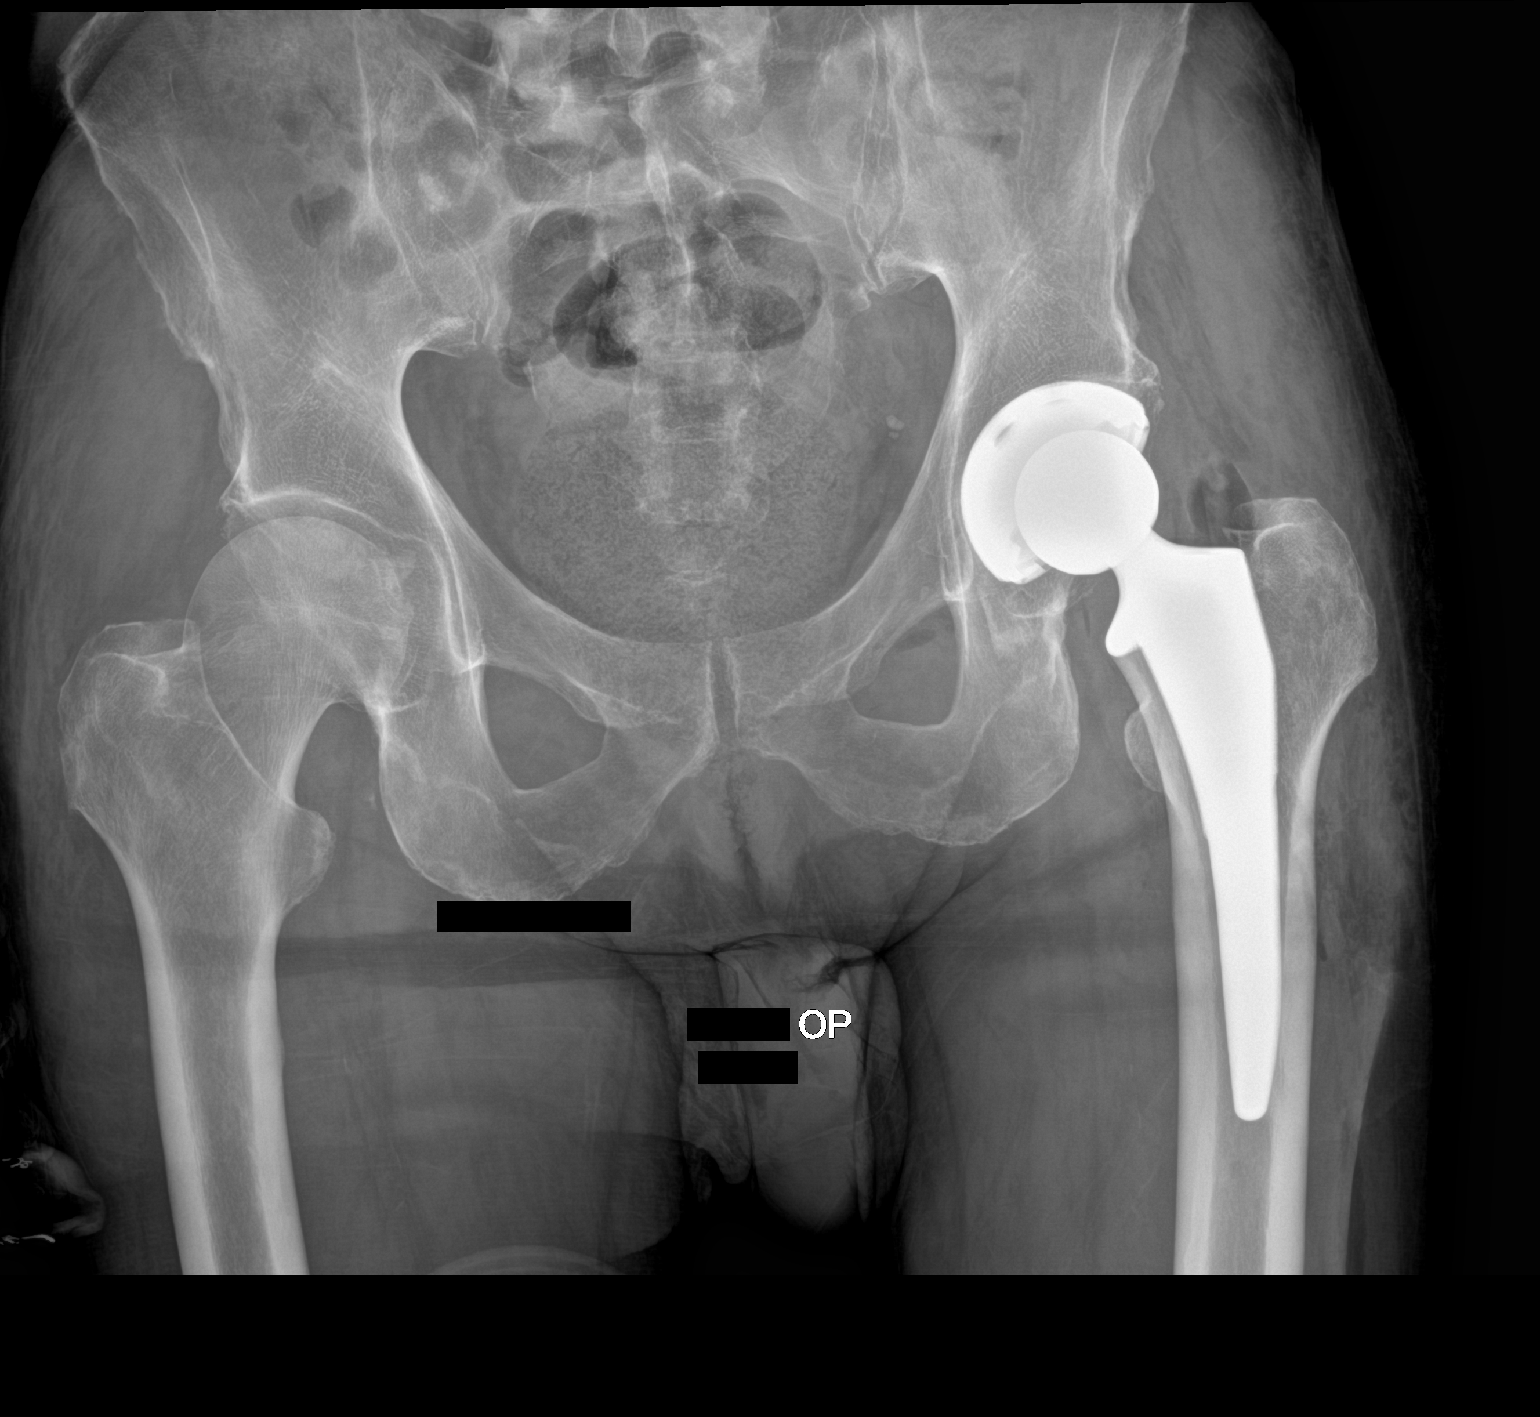

[1 of 1 positions shown; findings below may reference images not displayed]

FINDINGS: There is evidence of recent left hip arthroplasty. There are pockets
of air in the soft tissues. No fracture is seen.
IMPRESSION: Status post left hip arthroplasty.

## 2023-07-31 ENCOUNTER — Encounter: Payer: Self-pay | Admitting: Neurology

## 2023-08-01 ENCOUNTER — Encounter: Payer: Self-pay | Admitting: Neurology

## 2023-08-02 ENCOUNTER — Other Ambulatory Visit: Payer: Self-pay

## 2023-08-02 DIAGNOSIS — R4182 Altered mental status, unspecified: Secondary | ICD-10-CM

## 2023-08-02 DIAGNOSIS — F0283 Dementia in other diseases classified elsewhere, unspecified severity, with mood disturbance: Secondary | ICD-10-CM

## 2023-08-02 DIAGNOSIS — R413 Other amnesia: Secondary | ICD-10-CM

## 2023-08-02 DIAGNOSIS — F22 Delusional disorders: Secondary | ICD-10-CM

## 2023-08-02 NOTE — Telephone Encounter (Signed)
Please call patients wife to schedule neurocog testing

## 2023-08-20 ENCOUNTER — Encounter: Payer: Self-pay | Admitting: Psychology

## 2023-08-21 ENCOUNTER — Ambulatory Visit: Payer: Self-pay

## 2023-08-21 ENCOUNTER — Ambulatory Visit (INDEPENDENT_AMBULATORY_CARE_PROVIDER_SITE_OTHER): Payer: Medicare Other | Admitting: Psychology

## 2023-08-21 ENCOUNTER — Encounter: Payer: Self-pay | Admitting: Psychology

## 2023-08-21 DIAGNOSIS — G20B1 Parkinson's disease with dyskinesia, without mention of fluctuations: Secondary | ICD-10-CM

## 2023-08-21 DIAGNOSIS — G20A1 Parkinson's disease without dyskinesia, without mention of fluctuations: Secondary | ICD-10-CM | POA: Insufficient documentation

## 2023-08-21 DIAGNOSIS — F02B11 Dementia in other diseases classified elsewhere, moderate, with agitation: Secondary | ICD-10-CM | POA: Diagnosis not present

## 2023-08-21 DIAGNOSIS — R4189 Other symptoms and signs involving cognitive functions and awareness: Secondary | ICD-10-CM

## 2023-08-21 HISTORY — DX: Parkinson's disease without dyskinesia, without mention of fluctuations: G20.A1

## 2023-08-21 NOTE — Progress Notes (Signed)
   Psychometrician Note   Cognitive testing was administered to Frederick Pham by Frederick Pham, B.S. (psychometrist) under the supervision of Dr. Newman Nickels, Ph.D., ABPP, licensed psychologist on 08/21/2023. Mr. Mcsweeney did not appear overtly distressed by the testing session per behavioral observation or responses across self-report questionnaires. Rest breaks were offered.    The battery of tests administered was selected by Dr. Newman Nickels, Ph.D., ABPP with consideration to Frederick Pham current level of functioning, the nature of his symptoms, emotional and behavioral responses during interview, level of literacy, observed level of motivation/effort, and the nature of the referral question. This battery was communicated to the psychometrist. Communication between Dr. Newman Nickels, Ph.D., ABPP and the psychometrist was ongoing throughout the evaluation and Dr. Newman Nickels, Ph.D., ABPP was immediately accessible at all times. Dr. Newman Nickels, Ph.D., ABPP provided supervision to the psychometrist on the date of this service to the extent necessary to assure the quality of all services provided.    Frederick Pham will return within approximately 1-2 weeks for an interactive feedback session with Dr. Milbert Coulter at which time his test performances, clinical impressions, and treatment recommendations will be reviewed in detail. Frederick Pham understands he can contact our office should he require our assistance before this time.  A total of 125 minutes of billable time were spent face-to-face with Frederick Pham by the psychometrist. This includes both test administration and scoring time. Billing for these services is reflected in the clinical report generated by Dr. Newman Nickels, Ph.D., ABPP  This note reflects time spent with the psychometrician and does not include test scores or any clinical interpretations made by Dr. Milbert Coulter. The full report will follow in a separate note.

## 2023-08-21 NOTE — Progress Notes (Addendum)
 NEUROPSYCHOLOGICAL EVALUATION [place]. [facility] Department of Neurology  Date of Evaluation: August 21, 2023  Reason for Referral:   Frederick Pham is a 73 y.o. right-handed Caucasian male referred by Kerin Salen, D.O., to characterize his current cognitive functioning and assist with diagnostic clarity and treatment planning in the context of Parkinson's disease and concern for progressive cognitive decline.   Assessment and Plan:   Clinical Impression(s): Frederick Pham pattern of performance is suggestive of prominent impairment surrounding processing speed, executive functioning (i.e., cognitive flexibility), verbal fluency, visuospatial abilities, and encoding (i.e., learning) aspects of memory. Further weakness/variability was exhibited across both delayed retrieval and recognition/consolidation aspects of memory. Performances were appropriate relative to age-matched peers across basic attention and confrontation naming. Functionally, his wife has fully taken over all medication management, financial management, and bill paying responsibilities. He has also been advised to stop driving by members of his medical team. Given the extent of cognitive and functional impairment, Frederick Pham best meets diagnostic criteria for a Major Neurocognitive Disorder ("dementia") at the present time.  The etiology for his dementia presentation is most likely a parkinsonian presentation. I cannot rule out underlying Lewy body disease. Visuospatial deficits were quite extreme across testing, consistent with this presentation. Behaviorally, his wife described fully-formed visual hallucinations, some delusional thinking where he seems temporarily detached from reality, and fluctuations in alertness, all of which are common features of this disease process. There also remains the potential that cognitive impairment and motor impairment occurred along a similar timeframe, which would  further raise concern for this illness.  Outside of Lewy body disease, he does carry a formal Parkinson's disease diagnosis. Areas of prominent impairment align with expectations for this disease process well. Visual hallucinations are not unique to Lewy body disease and can certainly be seen in Parkinson's disease. Benefit from levodopa medications is also consistent with Parkinson's disease and would not be consistent with typical Lewy body disease presentations. Dr. Arbutus Leas has also theorized that motor symptoms were present for a significant amount of time prior to his eventual diagnosis and that he may have been under-treated by other providers in the past. This timeframe would also be more consistent with Parkinson's disease if accurate.  Despite some memory weakness, delayed retrieval and storage capabilities were improved relative to initial learning efforts. This aligns with Parkinson's disease expectations and is not consistent with typically presenting Alzheimer's disease. Reported increases in aggression and agitation were said to occur in the evening and may be related to increased fatigue or a sundowning experience. I do not have prominent concerns for frontotemporal lobar degeneration at the present time. Recent neuroimaging also did not suggest prominent cerebrovascular disease, making a vascular dementia presentation very unlikely.   Recommendations: Frederick Pham is encouraged to continue working closely with Dr. Arbutus Leas to best manage symptoms of his parkinsonian condition.   Based upon cognitive testing, I agree with Dr. Don Perking recommendations surrounding driving and also would recommend that he fully abstain from all driving behaviors.  Current testing does not directly assess on-the-job tasks or responsibilities. Still, given the degree of cognitive impairment, I would recommend that Frederick Pham and his wife more strongly discuss retirement plans and options as the likelihood for mistakes at  work or diminished performance is notably heightened.  It will be important for Frederick Pham to have another person with him when in situations where he may need to process information, weigh the pros and cons of different options, and make decisions, in  order to ensure that he fully understands and recalls all information to be considered. He will likely benefit from the establishment and maintenance of a routine in order to maximize his functional abilities over time.  If not already done, Frederick Pham and his family may want to discuss his wishes regarding durable power of attorney and medical decision making, so that he can have input into these choices. If they require legal assistance with this, long-term care resource access, or other aspects of estate planning, they could reach out to [employer] at [phone] for a free consultation. Additionally, they may wish to discuss future plans for caretaking and seek out community options for in home/residential care should they become necessary.  Frederick Pham is encouraged to attend to lifestyle factors for brain health (e.g., regular physical exercise, good nutrition habits and consideration of the MIND-DASH diet, regular participation in cognitively-stimulating activities, and general stress management techniques), which are likely to have benefits for both emotional adjustment and cognition. Optimal control of vascular risk factors (including safe cardiovascular exercise and adherence to dietary recommendations) is encouraged. Continued participation in activities which provide mental stimulation and social interaction is also recommended.   If interested, there are some activities which have therapeutic value and can be useful in keeping him cognitively stimulated. For suggestions, Frederick Pham is encouraged to go to the following website:  https://www.[facility].org/get-to-know-barrow/centers-programs/neurorehabilitation-center/neuro-rehab-apps-and-games/ which has options, categorized by level of difficulty. It should be noted that these activities should not be viewed as a substitute for therapy.  Important information should be provided to Mr. Stejskal in written format in all instances. This information should be placed in a highly frequented and easily visible location within his home to promote recall. External strategies such as written notes in a consistently used memory journal, visual and nonverbal auditory cues such as a calendar on the refrigerator or appointments with alarm, such as on a cell phone, can also help maximize recall.  Memory can be improved using internal strategies such as rehearsal, repetition, chunking, mnemonics, association, and imagery. External strategies such as written notes in a consistently used memory journal, visual and nonverbal auditory cues such as a calendar on the refrigerator or appointments with alarm, such as on a cell phone, can also help maximize recall.    When learning new information, he would benefit from information being broken up into small, manageable pieces. He may also find it helpful to articulate the material in his own words and in a context to promote encoding at the onset of a new task. This material may need to be repeated multiple times to promote encoding.  Because he shows better recall for structured information, he will likely understand and retain new information better if it is presented to him in a meaningful or well-organized manner at the outset, such as grouping items into meaningful categories or presenting information in an outlined, bulleted, or story format.  To address problems with processing speed, he may wish to consider:   -Ensuring that he is alerted when essential material or instructions are being presented   -Adjusting the speed at which new  information is presented   -Allowing for more time in comprehending, processing, and responding in conversation   -Repeating and paraphrasing instructions or conversations aloud  To address problems with fluctuating attention and/or executive dysfunction, he may wish to consider:   -Avoiding external distractions when needing to concentrate   -Limiting exposure to fast paced environments with multiple sensory demands   -Writing down  complicated information and using checklists   -Attempting and completing one task at a time (i.e., no multi-tasking)   -Verbalizing aloud each step of a task to maintain focus   -Taking frequent breaks during the completion of steps/tasks to avoid fatigue   -Reducing the amount of information considered at one time   -Scheduling more difficult activities for a time of day where he is usually most alert  Review of Records:   Mr. Palma was seen by [facility] Lurena Joiner Tat, D.O.) on 10/30/2022 for ongoing care of Parkinson's disease. He was originally seen at [facility] in May 2023 following hospital acquired delirium due to morphine given following a hip fracture. This resolved quickly; however, symptoms of bradykinesia, left arm/hand resting tremor, and left upper extremity rigidity was observed. Parkinson's disease was suspected and he was started on levodopa medication. When seen by Dr. Arbutus Leas, she suspected that Parkinson's disease was an accurate diagnosis. She also suspected that symptoms had been present prior to May 2023. Medications were adjusted. Neuropsychological testing was declined at that time.  Mr. Belsito was most recently seen by Dr. Arbutus Leas for follow-up on 04/23/2023. Medications were adjusted. Dr. Arbutus Leas advised him to no longer drive due to suspected cognitive decline and physical lower body symptoms. Neuropsychological testing was again declined. Records suggest that his wife called Dr. Arbutus Leas in February 2025 expressing concern for  more frequent "disconnects" where he seems unaware of where they are or who she is, as well as poor sleep. He was agreeable for testing at that time.   Ultimately, Mr. Bunton was referred for a comprehensive neuropsychological evaluation to characterize his cognitive abilities and to assist with diagnostic clarity and treatment planning.   Neuroimaging: Brain MRI on 11/13/2022 suggested advanced generalized volume loss without lobar predominance.   Past Medical History:  Diagnosis Date   Acute metabolic encephalopathy 10/13/2022   Acute renal failure (ARF) 10/13/2022   AKI (acute kidney injury) 10/13/2022   Anemia of chronic disease 09/13/2021   Arthritis    Basal cell carcinoma 08/03/2021   BPH (benign prostatic hyperplasia)    Chronic lymphocytic leukemia, Rai stage I 02/08/2021   Closed right hip fracture 01/30/2023   Delirium 09/14/2021   Fracture of femoral neck, left (HCC) 09/12/2021   History of hip replacement 09/27/2022   Idiopathic neuropathy 05/25/2022   Leukocytosis 09/14/2021   Melanoma 08/19/2021   2nd surgery to remove melanoma  on back. Dr. reports it all gone.     Nocturia 01/14/2021   Onychomycosis 01/14/2021   Osteoarthritis of right knee 09/05/2021   Pain of left hip joint 09/30/2021   Parkinson's disease    Thrombocytopenia 02/08/2021   Tremor of left hand 01/14/2021    Past Surgical History:  Procedure Laterality Date   FRACTURE SURGERY     age 75   TONSILLECTOMY     age 9   TOTAL HIP ARTHROPLASTY Left 09/14/2021   Procedure: TOTAL HIP ARTHROPLASTY ANTERIOR APPROACH;  Surgeon: Ollen Gross, MD;  Location: [facility];  Service: Orthopedics;  Laterality: Left;   TOTAL HIP ARTHROPLASTY Right 01/31/2023   Procedure: TOTAL HIP ARTHROPLASTY ANTERIOR APPROACH;  Surgeon: Cammy Copa, MD;  Location: [facility];  Service: Orthopedics;  Laterality: Right;   TOTAL KNEE ARTHROPLASTY Right 09/05/2021   Procedure: TOTAL KNEE ARTHROPLASTY;  Surgeon: Ollen Gross, MD;   Location: [facility];  Service: Orthopedics;  Laterality: Right;   WISDOM TOOTH EXTRACTION      Current Outpatient Medications:    acetaminophen (  TYLENOL) 500 MG tablet, Take 500 mg by mouth as needed for moderate pain., Disp: , Rfl:    aspirin EC 81 MG tablet, Take 1 tablet (81 mg total) by mouth 2 (two) times daily. Swallow whole., Disp: 30 tablet, Rfl: 12   calcium-vitamin D (OSCAL WITH D) 500-5 MG-MCG tablet, Take 2 tablets by mouth 2 (two) times daily., Disp: 120 tablet, Rfl: 0   carbidopa-levodopa (SINEMET IR) 25-100 MG tablet, TAKE 1 1/2 TABLETS BY MOUTH 3 TIMES DAILY. 7AM/11AM/4PM, Disp: 405 tablet, Rfl: 0   tamsulosin (FLOMAX) 0.4 MG CAPS capsule, Take 0.4 mg by mouth in the morning and at bedtime., Disp: , Rfl:   Clinical Interview:   The following information was obtained during a clinical interview with Mr. Slivinski and his wife prior to cognitive testing.  Cognitive Symptoms: Decreased short-term memory: Denied. While his wife did not wish to speak at length in front of him, she did broadly describe significant concerns surrounding short-term memory decline, including trouble recalling recent conversations and greater repetition in conversation. She also described "episodes" where he seems disconnected from reality. She noted that he will talk to her about her in third person (e.g., "she was just in here and said..."). Difficulties were said to have progressively worsened over time.  Decreased long-term memory: Denied. Decreased attention/concentration: Endorsed. He noted that difficulties with focus were "not prominent" but did acknowledge their presence. He theorized that difficulties were largely interest-dependant. His wife was in agreement but alluded to greater degrees of attentional dysregulation and distractibility.  Reduced processing speed: Denied. His wife alluded to diminished processing speed.  Difficulties with executive functions: Endorsed. Specifically, he reported a  generally longstanding weakness surrounding multi-tasking abilities. His wife reported ongoing concerns surrounding problem solving, organization, and multi-tasking abilities.  Difficulties with emotion regulation: Denied. His wife alluded to greater confusion, as well as instances of aggression or increased agitation at night. Some of this was attributed to prominent sleep dysfunction over the past six months.  Difficulties with receptive language: Denied. Difficulties with word finding: Endorsed "occasionally." Decreased visuoperceptual ability: Endorsed. While somewhat tangential, he did acknowledge some depth perception concerns while ambulating in his environment.   Difficulties completing ADLs: His wife has fully taken over medication management, financial management, and bill paying responsibilities. He has been advised by Dr. Arbutus Leas to stop driving due to cognitive and physical decline. He described himself as being in the process of retiring. Separately, his wife alluded to her being the one "running the company" lately.   Additional Medical History: History of traumatic brain injury/concussion: Denied. History of stroke: Denied. History of seizure activity: Denied. History of known exposure to toxins: Denied. Symptoms of chronic pain: Denied. Experience of frequent headaches/migraines: Denied. Frequent instances of dizziness/vertigo: Denied.  Sensory changes: He utilizes glasses and hearing aids with benefit.  Balance/coordination difficulties: Endorsed. While he generally downplayed concerns, his wife reported quite significant balance instability. Whereas he largely denied falling behaviors outside of instances where he slips and catches himself, his wife reported fairly frequent falling behaviors. He also experiences significant gait freezes while ambulating due to Parkinson's disease.  Other motor difficulties: Endorsed. They described tremors predominantly impacting his left hand/arm.  Symptoms were said to be notably improved via levodopa medications per his wife.   Sleep History: Estimated hours obtained each night: Unclear. This was said to be variable and of prominent concern during the past six months. His wife noted that he does not sleep during the night and remains awake. As  stated above, she described personality changes and trouble with agitation/aggression into the evenings. He may nap during the day.  Difficulties falling asleep: Endorsed. Difficulties staying asleep: Endorsed. Feels rested and refreshed upon awakening: Variably so depending on the quantity and quality of sleep obtained the night before.   History of snoring: Denied. History of waking up gasping for air: Denied. Witnessed breath cessation while asleep: Denied.  History of vivid dreaming: Endorsed. Excessive movement while asleep: Denied. Instances of acting out his dreams: Denied.  Psychiatric/Behavioral Health History: Depression: He described his current mood as "pretty good" and denied to his knowledge any previous mental health concerns or formal diagnoses. Current or remote suicidal ideation, intent, or plan was denied.  Anxiety: Denied. Mania: Denied. Trauma History: Denied. Visual/auditory hallucinations: Endorsed. His wife noted that fully formed visual hallucinations have been frequently occurring during the past six months. She was unsure if there was a link to any medication changes around that time. These did not appear limited to directly around sleep behaviors.  Delusional thoughts: Endorsed. As stated above, his wife described some periods where Mr. Alewine appears disconnected from reality. She noted times where he will speak to her about herself in third person. She also noted instances where he will inject himself into scenarios he watches on television.   Tobacco: Denied. Alcohol: He denied current alcohol consumption as well as a history of problematic alcohol abuse or  dependence.  Recreational drugs: Denied.  Family History: Adopted: Yes   This information was confirmed by Mr. Inclan.  Academic/Vocational History: Highest level of educational attainment: 14 years. He described himself as a very bright student in academic settings and performed quite well across subjects. No relative weaknesses were exhibited. After two years of college, he reportedly made a deal with the [employer] as he was set to be included in an upcoming draft lottery during the [place] conflict, ultimately joining the [employer].  History of developmental delay: Denied. History of grade repetition: Denied. Enrollment in special education courses: Denied. History of LD/ADHD: Denied.  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   Evaluation Results:   Behavioral Observations: Mr. Scarantino was accompanied by his wife, arrived to his appointment on time, and was appropriately dressed and groomed. He appeared alert. Freezing gait was exhibited on several occasions and he had trouble getting out of a seated chair due to such behaviors. Generally mild resting tremors were observed in his left hand throughout interview. His affect was generally relaxed and positive. Spontaneous speech was quite tangential at times. There were instances where his responses were seemingly unrelated to the initial questions asked. He also appeared to exhibit some flight of ideas where content would end up in a far different area from where his line of thinking originally began. Some of this may have been due to hearing loss and trouble comprehending what was being said to him (he was wearing hearing aids throughout). Spontaneous speech was generally fluent; however, some dysarthria was noted. No prominent word finding difficulties were observed. Thought processes were normal in content. Insight into his cognitive difficulties  appeared poor and I do have concern that he is unable to fully appreciate the extent of ongoing cognitive impairment and decline.   During testing, several tasks had to be discontinued due to comprehension difficulties and him being unable to understand task instructions. He was quite repetitive, repeatedly describing engineering concepts or  lack of sleep the night before. Frustration and testing tolerance issues were noted at times as he commonly pointed out how he felt that current cognitive testing had nothing to do with engineering and was thus inadequate or inappropriate. Per the psychometrist, some mild hostility and defensiveness was noted when asked to answer mood-related questions across related questionnaires. Sustained attention was appropriate. Task engagement was adequate with encouragement provided by the psychometrist and he generally persisted when challenged. Overall, Mr. Kleckner was cooperative with the clinical interview and subsequent testing procedures.   Adequacy of Effort: The validity of neuropsychological testing is limited by the extent to which the individual being tested may be assumed to have exerted adequate effort during testing. Mr. Amison expressed his intention to perform to the best of his abilities and exhibited adequate task engagement and persistence. Scores across stand-alone and embedded performance validity measures were variable. However, his sole below expectation performance was likely due to true and significant cognitive impairment rather than poor engagement or attempts to perform poorly. As such, the results of the current evaluation are believed to be a valid representation of Mr. Manseau current cognitive functioning.  Test Results: Mr. Ogan was mildly disoriented at the time of the current evaluation. He incorrectly stated his address. He also incorrectly stated the current year ("2400") and was unable to state the current day of the week or name  of the clinic.  Intellectual abilities based upon educational and vocational attainment were estimated to be in the average range. Premorbid abilities were estimated to be within the above average range based upon a single-word reading test.   Processing speed was exceptionally low. Basic attention was above average. More complex attention (e.g., working memory) was believed to be impaired. Cognitive flexibility was exceptionally low.  Assessed receptive language abilities were variable but likely have some degree of ongoing impairment. Mr. Bade had difficulty comprehending task instructions, generally across more complex tasks. He also was quite tangential during interview and had trouble providing relevant answers to asked questions. However, some of the latter could be related to hearing loss. Assessed expressive language was somewhat variable. Phonemic fluency was exceptionally low to well below average, semantic fluency was exceptionally low, and confrontation naming was average to above average.     Assessed visuospatial/visuoconstructional abilities were exceptionally low. When asked to draw a clock, hostility was observed in him feeling that this request was out-dated due to a reliance on digital clocks. He eventually drew the outer circle accurately. When placing numbers, he drew hash marks and non-legible markings assumed to represent numbers on the right side of the clock. There was no attempt at placing numbers on the left side of the clock. He also exhibited confusion and was unable to attempt placing the clock hands. His copy of a complex figure was extremely disorganized with notable visual distortions across essentially all aspects. He was unable to draw the outer rectangle.     Learning (i.e., encoding) of novel verbal information was exceptionally low to well below average. Spontaneous delayed recall (i.e., retrieval) of previously learned information was exceptionally low to below  average. Retention rates were 0% across a list learning task, 120% across a story learning task, and 100% (raw score of 3) across a figure drawing task. Performance across recognition tasks was variable, ranging from the exceptionally low to average normative ranges, suggesting some evidence for information consolidation.   Results of emotional screening instruments suggested that recent symptoms of generalized anxiety were in the minimal range,  while symptoms of depression were within normal limits. A screening instrument assessing recent sleep quality suggested the presence of minimal sleep dysfunction.  Tables of Scores:   Note: This summary of test scores accompanies the interpretive report and should not be considered in isolation without reference to the appropriate sections in the text. Descriptors are based on appropriate normative data and may be adjusted based on clinical judgment. Terms such as "Within Normal Limits" and "Outside Normal Limits" are used when a more specific description of the test score cannot be determined.       Percentile - Normative Descriptor > 98 - Exceptionally High 91-97 - Well Above Average 75-90 - Above Average 25-74 - Average 9-24 - Below Average 2-8 - Well Below Average < 2 - Exceptionally Low       Validity:   DESCRIPTOR       DCT: --- --- Outside Normal Limits  RBANS EI: --- --- Within Normal Limits       Orientation:      Raw Score Percentile   NAB Orientation, Form 1 23/29 --- ---       Cognitive Screening:      Raw Score Percentile   SLUMS: 13/30 --- ---       RBANS, Form A: Standard Score/ Scaled Score Percentile   Total Score --- --- ---  Immediate Memory 57 <1 Exceptionally Low    List Learning 2 <1 Exceptionally Low    Story Memory 4 2 Well Below Average  Visuospatial/Constructional --- --- ---    Figure Copy 1 <1 Exceptionally Low    Line Orientation Discontinued (comprehension) --- Impaired  Language 74 4 Well Below Average     Picture Naming 10/10 51-75 Average    Semantic Fluency 1 <1 Exceptionally Low  Attention --- --- ---    Digit Span 12 75 Above Average    Coding Discontinued (comprehension) --- Impaired  Delayed Memory 85 16 Below Average    List Recall 0/10 <2 Exceptionally Low    List Recognition 20/20 51-75 Average    Story Recall 7 16 Below Average    Story Recognition 7/12 5-7 Well Below Average    Figure Recall 3 1 Exceptionally Low    Figure Recognition 2/8 2-3 Exceptionally Low to Well Below Average        Intellectual Functioning:      Standard Score Percentile   Test of Premorbid Functioning: 114 82 Above Average       Attention/Executive Function:     Trail Making Test (TMT): Raw Score (T Score) Percentile     Part A 315 secs.,  3 errors (17) <1 Exceptionally Low    Part B Discontinued --- Impaired        D-KEFS Verbal Fluency Test: Raw Score (Scaled Score) Percentile     Letter Total Correct 17 (5) 5 Well Below Average    Category Total Correct 14 (2) <1 Exceptionally Low    Category Switching Total Correct 4 (1) <1 Exceptionally Low    Category Switching Accuracy 1 (1) <1 Exceptionally Low      Total Set Loss Errors 5 (6) 9 Below Average      Total Repetition Errors 3 (10) 50 Average       Language:     Verbal Fluency Test: Raw Score (T Score) Percentile     Phonemic Fluency (FAS) 17 (29) 2 Exceptionally Low    Animal Fluency 7 (19) <1 Exceptionally Low  NAB Language Module, Form 1: T Score Percentile     Naming 31/31 (58) 79 Above Average       Visuospatial/Visuoconstruction:      Raw Score Percentile   Clock Drawing: 2/10 --- Impaired       Mood and Personality:      Raw Score Percentile   Beck Depression Inventory - II: 3 --- Within Normal Limits  PROMIS Anxiety Questionnaire: 9 --- None to Slight       Additional Questionnaires:      Raw Score Percentile   PROMIS Sleep Disturbance Questionnaire: 13 --- None to Slight   Informed Consent and  Coding/Compliance:   The current evaluation represents a clinical evaluation for the purposes previously outlined by the referral source and is in no way reflective of a forensic evaluation.   Mr. Swanger was provided with a verbal description of the nature and purpose of the present neuropsychological evaluation. Also reviewed were the foreseeable risks and/or discomforts and benefits of the procedure, limits of confidentiality, and mandatory reporting requirements of this provider. The patient was given the opportunity to ask questions and receive answers about the evaluation. Oral consent to participate was provided by the patient.   This evaluation was conducted by Newman Nickels, Ph.D., ABPP-CN, board certified clinical neuropsychologist. Mr. Nguyen completed a clinical interview with Dr. Milbert Coulter, billed as one unit [phone], and 125 minutes of cognitive testing and scoring, billed as one unit [phone] and three additional units [id number]. Psychometrist Wallace Keller, B.S. assisted Dr. Milbert Coulter with test administration and scoring procedures. As a separate and discrete service, one unit [id number] and two units [phone] were billed for Dr. Tammy Sours time spent in interpretation and report writing.

## 2023-08-22 ENCOUNTER — Encounter: Payer: Self-pay | Admitting: Psychology

## 2023-08-28 ENCOUNTER — Encounter: Payer: Self-pay | Admitting: Orthopedic Surgery

## 2023-09-05 ENCOUNTER — Ambulatory Visit (INDEPENDENT_AMBULATORY_CARE_PROVIDER_SITE_OTHER): Payer: Medicare Other | Admitting: Psychology

## 2023-09-05 ENCOUNTER — Encounter: Payer: Self-pay | Admitting: Psychology

## 2023-09-05 DIAGNOSIS — G20A1 Parkinson's disease without dyskinesia, without mention of fluctuations: Secondary | ICD-10-CM

## 2023-09-05 DIAGNOSIS — F02B11 Dementia in other diseases classified elsewhere, moderate, with agitation: Secondary | ICD-10-CM

## 2023-09-05 NOTE — Progress Notes (Signed)
   Neuropsychology Feedback Session Eligha Bridegroom. [facility] Department of Neurology  Reason for Referral:   Frederick Pham is a 73 y.o. right-handed Caucasian male referred by Kerin Salen, D.O., to characterize his current cognitive functioning and assist with diagnostic clarity and treatment planning in the context of Parkinson's disease and concern for progressive cognitive decline.   Feedback:   Mr. Delo completed a comprehensive neuropsychological evaluation on 08/21/2023. Please refer to that encounter for the full report and recommendations. Briefly, results suggested prominent impairment surrounding processing speed, executive functioning (i.e., cognitive flexibility), verbal fluency, visuospatial abilities, and encoding (i.e., learning) aspects of memory. Further weakness/variability was exhibited across both delayed retrieval and recognition/consolidation aspects of memory. Performances were appropriate relative to age-matched peers across basic attention and confrontation naming. The etiology for his dementia presentation is most likely a parkinsonian presentation. I cannot rule out underlying Lewy body disease. Visuospatial deficits were quite extreme across testing, consistent with this presentation. Behaviorally, his wife described fully-formed visual hallucinations, some delusional thinking where he seems temporarily detached from reality, and fluctuations in alertness, all of which are common features of this disease process. There also remains the potential that cognitive impairment and motor impairment occurred along a similar timeframe, which would further raise concern for this illness. Outside of Lewy body disease, he does carry a formal Parkinson's disease diagnosis. Areas of prominent impairment align with expectations for this disease process well. Visual hallucinations are not unique to Lewy body disease and can certainly be seen in Parkinson's disease. Benefit  from levodopa medications is also consistent with Parkinson's disease and would not be consistent with typical Lewy body disease presentations. Dr. Arbutus Leas has also theorized that motor symptoms were present for a significant amount of time prior to his eventual diagnosis and that he may have been under-treated by other providers in the past. This timeframe would also be more consistent with Parkinson's disease if accurate.  Mr. Ullman was accompanied by his wife during the current feedback session. Content of the current session focused on the results of his neuropsychological evaluation. Mr. Scheib was given the opportunity to ask questions and his questions were answered. He was encouraged to reach out should additional questions arise. A copy of his report was provided at the conclusion of the visit.      One unit [phone] was billed for Dr. Tammy Sours time spent preparing for, conducting, and documenting the current feedback session with Mr. Abdalla.

## 2023-09-06 ENCOUNTER — Encounter: Payer: Self-pay | Admitting: Orthopedic Surgery

## 2023-09-07 ENCOUNTER — Encounter: Payer: Self-pay | Admitting: Neurology

## 2023-09-07 NOTE — Telephone Encounter (Signed)
Message routed to PCP [name], Amy E, NP  

## 2023-09-10 ENCOUNTER — Other Ambulatory Visit: Payer: Self-pay | Admitting: Orthopedic Surgery

## 2023-09-10 NOTE — Telephone Encounter (Signed)
Message routed to PCP [name], Amy E, NP  

## 2023-09-12 ENCOUNTER — Encounter: Payer: Self-pay | Admitting: Neurology

## 2023-09-13 NOTE — Progress Notes (Addendum)
 Virtual Visit Via Video       Consent was obtained for video visit:  Yes.   Answered questions that patient had about telehealth interaction:  Yes.   I discussed the limitations, risks, security and privacy concerns of performing an evaluation and management service by telemedicine. I also discussed with the patient that there may be a patient responsible charge related to this service. The patient expressed understanding and agreed to proceed.  Pt location: Home in [place] Physician Location: office Name of referring provider:  [name], NP I connected with Frederick Pham at patients initiation/request on 09/17/2023 at  8:45 AM EDT by video enabled telemedicine application and verified that I am speaking with the correct person using two identifiers. Pt MRN:  [id number] Pt DOB:  1950-07-11 Video Participants:  Frederick Pham;  wife supplements hx  Assessment/Plan:   1.  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  I suspect that this is the correct diagnosis.               -continue carbidopa/levodopa 25/100, 1.5 tablets at 7 AM/11 AM/4 PM.               -external order for PT originally printed and previously given to patient as they state that they have MCR benefits for maintenance PT but can't find anyone to take them.  They found that [facility] was able to take them, but they wanted [place] to fax it after this visit, as the previous order was outdated.  We will fax to given fax number [phone])     2.  Urinary retention             -following with [facility].  Has foley catheter   3.  CLL             -follows with Dr. Leonides Schanz   4.  Moderate dementia             -Neurocognitive testing in March, 2025 with evidence of moderate dementia.  Dr. Milbert Coulter is recommended fully abstaining from all driving.  I stressed to this today as well.  He recommended retiring  completely.  Family would like to tap and there long-term care insurance and actually agree with that and agree with 24 hour/day care.  -Asked about donepezil.  We discussed the risks and benefits of this drug as well as the value of this medication.  They would like to trial that.  We will start at 5 mg daily.  5.  Hip fracture, August 2024  -Following with orthopedics  6.  Watering eyes  -likely due to dry eye from Parkinsons Disease   7.  Day/night reversal  -discussed proper sleep schedule  - Discussed following up with primary care to help [place] more with this.  Subjective:   Frederick Pham was seen today in follow up for Parkinsons disease.  My previous records were reviewed prior to todays visit as well as outside records available to me.  Pt with wife who supplements hx.   patient recently saw Dr. Milbert Coulter.  He had neurocognitive testing on March 11 which demonstrated moderate dementia.  His wife sent a list of concerns, including day/night reversal.  They have a caregiver starting today for the day and are awaiting a nightime caregiver via agency.  I have asked them previously to discuss sleep issues with primary care.  She also asked me to write a order for in-home physical therapy.  I did that last visit and gave them the order, as they noted that they had Medicare benefits for maintenance physical therapy but they could not find anyone to take it.   They have found someone but they needed a new RX updated for seeing him within the last 90 days.  [facility].   He is not driving currently and has not driven for a month and that was just across the street.  Asks about eyes watering bilaterally   Current prescribed movement disorder medications: carbidopa/levodopa 25/100, 1.5 tablets at 7 AM/11 AM/4 PM    ALLERGIES:   Allergies  Allergen Reactions   Morphine Other (See Comments)    " Caused extreme confusion and combativeness "     CURRENT MEDICATIONS:   Current Meds  Medication Sig   acetaminophen (TYLENOL) 500 MG tablet Take 500 mg by mouth as needed for moderate pain.   aspirin EC 81 MG tablet Take 1 tablet (81 mg total) by mouth 2 (two) times daily. Swallow whole.   calcium-vitamin D (OSCAL WITH D) 500-5 MG-MCG tablet Take 2 tablets by mouth 2 (two) times daily.   carbidopa-levodopa (SINEMET IR) 25-100 MG tablet TAKE 1 1/2 TABLETS BY MOUTH 3 TIMES DAILY. 7AM/11AM/4PM   donepezil (ARICEPT) 5 MG tablet Take 1 tablet (5 mg total) by mouth at bedtime.     Objective:   PHYSICAL EXAMINATION:    VITALS:   There were no vitals filed for this visit.   GEN:  The patient appears stated age and is in NAD. HEENT:  Normocephalic, atraumatic.    Neurological examination:  Orientation: The patient is alert and oriented to person/place.  Patient is a bit confused and jumps from one topic to the next without finishing presented topic. Cranial nerves: There is good facial symmetry with mild facial hypomimia. The speech is fluent and clear. Soft palate rises symmetrically and there is no tongue deviation. Hearing is intact to conversational tone. The patient pushes off to arise.  He is not using a walker.  He doesn't swing the L arm at all and drags the leg but is better than last visit.   I have reviewed and interpreted the following labs independently    Chemistry      Component Value Date/Time   NA 142 05/24/2023 1101   K 4.4 05/24/2023 1101   CL 106 05/24/2023 1101   CO2 31 05/24/2023 1101   BUN 25 (H) 05/24/2023 1101   CREATININE 1.16 05/24/2023 1101      Component Value Date/Time   CALCIUM 9.4 05/24/2023 1101   ALKPHOS 76 05/24/2023 1101   AST 14 (L) 05/24/2023 1101   ALT 14 05/24/2023 1101   BILITOT 0.9 05/24/2023 1101       Lab Results  Component Value Date   WBC 38.1 (H) 05/24/2023   HGB 12.7 (L) 05/24/2023   HCT 37.9 (L) 05/24/2023   MCV 94.3 05/24/2023   PLT 102 (L) 05/24/2023    No results found for:  "TSH"   Follow up Instructions      -I discussed the assessment and treatment plan with the patient. The patient was provided an opportunity to ask questions and all were answered. The patient agreed with the plan and demonstrated an understanding of the instructions.   The patient was advised to call  back or seek an in-person evaluation if the symptoms worsen or if the condition fails to improve as anticipated.    Total time spent on today's visit was 30 minutes, including both face-to-face time and nonface-to-face time.  Time included that spent on review of records (prior notes available to me/labs/imaging if pertinent), discussing treatment and goals, answering patient's questions and coordinating care.   Kerin Salen, DO   Cc:  [name], NP

## 2023-09-17 ENCOUNTER — Encounter: Payer: Self-pay | Admitting: Neurology

## 2023-09-17 ENCOUNTER — Other Ambulatory Visit: Payer: Self-pay

## 2023-09-17 ENCOUNTER — Telehealth (INDEPENDENT_AMBULATORY_CARE_PROVIDER_SITE_OTHER): Admitting: Neurology

## 2023-09-17 DIAGNOSIS — F02B3 Dementia in other diseases classified elsewhere, moderate, with mood disturbance: Secondary | ICD-10-CM

## 2023-09-17 DIAGNOSIS — H04203 Unspecified epiphora, bilateral lacrimal glands: Secondary | ICD-10-CM

## 2023-09-17 DIAGNOSIS — G20A1 Parkinson's disease without dyskinesia, without mention of fluctuations: Secondary | ICD-10-CM

## 2023-09-17 DIAGNOSIS — G479 Sleep disorder, unspecified: Secondary | ICD-10-CM | POA: Diagnosis not present

## 2023-09-17 MED ORDER — DONEPEZIL HCL 5 MG PO TABS: 5.0000 mg | ORAL_TABLET | Freq: Every day | ORAL | 1 refills | Status: DC

## 2023-09-17 NOTE — Progress Notes (Signed)
 This referral has been sent to [facility]

## 2023-09-17 NOTE — Progress Notes (Signed)
 Referral has been sent to [facility]

## 2023-10-19 ENCOUNTER — Encounter (INDEPENDENT_AMBULATORY_CARE_PROVIDER_SITE_OTHER): Payer: Self-pay | Admitting: Otolaryngology

## 2023-10-22 ENCOUNTER — Other Ambulatory Visit: Payer: Self-pay | Admitting: Neurology

## 2023-10-22 DIAGNOSIS — G20B1 Parkinson's disease with dyskinesia, without mention of fluctuations: Secondary | ICD-10-CM

## 2023-10-23 ENCOUNTER — Ambulatory Visit: Payer: Medicare Other | Admitting: Neurology

## 2023-10-24 ENCOUNTER — Encounter: Payer: Self-pay | Admitting: Neurology

## 2023-11-06 ENCOUNTER — Encounter: Payer: Self-pay | Admitting: Neurology

## 2023-11-22 ENCOUNTER — Inpatient Hospital Stay: Payer: Medicare Other | Attending: Hematology and Oncology

## 2023-11-22 ENCOUNTER — Inpatient Hospital Stay: Payer: Medicare Other | Admitting: Hematology and Oncology

## 2023-11-22 ENCOUNTER — Other Ambulatory Visit: Payer: Self-pay | Admitting: Hematology and Oncology

## 2023-11-22 VITALS — BP 141/80 | HR 82 | Temp 98.2°F | Resp 14 | Wt 131.5 lb

## 2023-11-22 DIAGNOSIS — N400 Enlarged prostate without lower urinary tract symptoms: Secondary | ICD-10-CM

## 2023-11-22 DIAGNOSIS — D696 Thrombocytopenia, unspecified: Secondary | ICD-10-CM | POA: Diagnosis not present

## 2023-11-22 DIAGNOSIS — C9110 Chronic lymphocytic leukemia of B-cell type not having achieved remission: Secondary | ICD-10-CM | POA: Diagnosis present

## 2023-11-22 DIAGNOSIS — D72820 Lymphocytosis (symptomatic): Secondary | ICD-10-CM

## 2023-11-22 DIAGNOSIS — Z87891 Personal history of nicotine dependence: Secondary | ICD-10-CM | POA: Insufficient documentation

## 2023-11-22 LAB — CMP (CANCER CENTER ONLY)
ALT: 5 U/L (ref 0–44)
AST: 16 U/L (ref 15–41)
Albumin: 3.8 g/dL (ref 3.5–5.0)
Alkaline Phosphatase: 77 U/L (ref 38–126)
Anion gap: 12 (ref 5–15)
BUN: 25 mg/dL — ABNORMAL HIGH (ref 8–23)
CO2: 25 mmol/L (ref 22–32)
Calcium: 9.5 mg/dL (ref 8.9–10.3)
Chloride: 105 mmol/L (ref 98–111)
Creatinine: 1.28 mg/dL — ABNORMAL HIGH (ref 0.61–1.24)
GFR, Estimated: 59 mL/min — ABNORMAL LOW (ref >60)
Glucose, Bld: 116 mg/dL — ABNORMAL HIGH (ref 70–99)
Potassium: 4.3 mmol/L (ref 3.5–5.1)
Sodium: 142 mmol/L (ref 135–145)
Total Bilirubin: 1.4 mg/dL — ABNORMAL HIGH (ref 0.0–1.2)
Total Protein: 6.2 g/dL — ABNORMAL LOW (ref 6.5–8.1)

## 2023-11-22 LAB — CBC WITH DIFFERENTIAL (CANCER CENTER ONLY)
Abs Immature Granulocytes: 0.02 10*3/uL (ref 0.00–0.07)
Basophils Absolute: 0 10*3/uL (ref 0.0–0.1)
Basophils Relative: 0 %
Eosinophils Absolute: 0.1 10*3/uL (ref 0.0–0.5)
Eosinophils Relative: 0 %
HCT: 38 % — ABNORMAL LOW (ref 39.0–52.0)
Hemoglobin: 12.6 g/dL — ABNORMAL LOW (ref 13.0–17.0)
Immature Granulocytes: 0 %
Lymphocytes Relative: 97 %
Lymphs Abs: 63.6 10*3/uL — ABNORMAL HIGH (ref 0.7–4.0)
MCH: 31.7 pg (ref 26.0–34.0)
MCHC: 33.2 g/dL (ref 30.0–36.0)
MCV: 95.5 fL (ref 80.0–100.0)
Monocytes Absolute: 0.3 10*3/uL (ref 0.1–1.0)
Monocytes Relative: 0 %
Neutro Abs: 1.6 10*3/uL — ABNORMAL LOW (ref 1.7–7.7)
Neutrophils Relative %: 3 %
Platelet Count: 115 10*3/uL — ABNORMAL LOW (ref 150–400)
RBC: 3.98 MIL/uL — ABNORMAL LOW (ref 4.22–5.81)
RDW: 15.5 % (ref 11.5–15.5)
Smear Review: NORMAL
WBC Count: 65.6 10*3/uL (ref 4.0–10.5)
nRBC: 0 % (ref 0.0–0.2)

## 2023-11-22 LAB — LACTATE DEHYDROGENASE: LDH: 88 U/L — ABNORMAL LOW (ref 98–192)

## 2023-11-22 NOTE — Progress Notes (Signed)
 CRITICAL VALUE STICKER  CRITICAL VALUE: WBC 65.6  RECEIVER (on-site recipient of call): Skipper Dumas, RN  DATE & TIME NOTIFIED: 11/22/23 11:30 am  MESSENGER (representative from lab):Jessica  MD NOTIFIED: Dr. Rosaline Coma  TIME OF NOTIFICATION: 11:40 am  RESPONSE: Pt.  seen in Clinic

## 2023-11-22 NOTE — Progress Notes (Signed)
 [facility] [phone]   [phone]  PROGRESS NOTE  Patient Care Team: Arnetha Bhat, NP as PCP - General (Adult Health Nurse Practitioner) Floyce Hutching, DPM as Consulting Physician (Podiatry) Liliane Rei, MD as Consulting Physician (Orthopedic Surgery) Tat, Von Grumbling, DO as Consulting Physician (Neurology)  Hematological/Oncological History # Chronic Lymphocytic Leukemia, Rai Stage I 1) 01/21/2021: WBC 39.8 (H), Hgb 14.2, Plt 131 (L)  2) 02/08/2021: Establish care with Wyline Hearing PA-C. WBC: 46.7, Hgb 14.1, Plt 113K. ALC 45,900. Flow cytometry confirms a monoclonal B cell population consistent with CLL.  3) 02/18/2021: CLL prognostic panel results show normal cytogenetics, intermediate risk. IgVH positive. CT C/A/P showed enlarged retroperitoneal, portacaval, bilateral iliac, and pelvic sidewall lymph nodes, no evidence of splenomegaly.  05/26/2021: WBC 48.8, Hgb 13.3, MCV 95, Plt 90 08/24/2021: WBC 60.8, Hgb 13.6, MCV 94.5, Plt 108 11/24/2021: WBC 41.8, Hgb 12.9, MCV 91.8, Plt 122 05/24/2022: WBC 59.0, Hgb 13.7, MCV 95.2, Plt 101  Interval History:  Artha Stavros A Trusty 73 y.o. male with medical history significant for newly diagnosed CLL who presents for a follow up visit. The patient's last visit was on 05/24/2023. In the interim since the last visit he has had no major changes in his health.  On exam today Mr. Knierim reports he is quite sleepy today slept poorly last night.  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  He notes that he has been keeping well otherwise.  His energy is about a 4 out of 10 today.  He reports his appetite is good though he is drifting more towards soft food as he recently bit his tongue.  He notes that he has had no recent illnesses such as fevers, chills, sweats.  He denies any bumps or lumps concerning for lymphadenopathy.  He did injure his elbow recently and reports that it  clotted fast with no excessive bleeding.  He said no recent fevers, chills, sweats.  His weight has declined and he is down to 131 pounds today, down from 146 back in December.  He is unsure why he is losing weight.  Overall he feels well and has no other questions concerns or complaints today.  A full 10 point ROS is otherwise negative.  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 Dr. reports it all gone.     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   Thrombocytopenia 02/08/2021   Tremor of left hand 01/14/2021    SURGICAL HISTORY: Past Surgical History:  Procedure Laterality Date   FRACTURE SURGERY     age 18   TONSILLECTOMY  age 85   TOTAL HIP ARTHROPLASTY Left 09/14/2021   Procedure: TOTAL HIP ARTHROPLASTY ANTERIOR APPROACH;  Surgeon: Liliane Rei, MD;  Location: [facility];  Service: Orthopedics;  Laterality: Left;   TOTAL HIP ARTHROPLASTY Right 01/31/2023   Procedure: TOTAL HIP ARTHROPLASTY ANTERIOR APPROACH;  Surgeon: Jasmine Mesi, MD;  Location: [facility];  Service: Orthopedics;  Laterality: Right;   TOTAL KNEE ARTHROPLASTY Right 09/05/2021   Procedure: TOTAL KNEE ARTHROPLASTY;   Surgeon: Liliane Rei, MD;  Location: [facility];  Service: Orthopedics;  Laterality: Right;   WISDOM TOOTH EXTRACTION      SOCIAL HISTORY: Social History   Socioeconomic History   Marital status: Married    Spouse name: Not on file   Number of children: Not on file   Years of education: 14   Highest education level: Some college, no degree  Occupational History   Occupation: Medical illustrator  Tobacco Use   Smoking status: Former    Current packs/day: 0.00    Average packs/day: 1 pack/day for 10.0 years (10.0 ttl pk-yrs)    Types: Cigarettes    Start date: 10/07/1980    Quit date: 10/08/1990    Years since quitting: 33.1   Smokeless tobacco: Never  Vaping Use   Vaping status: Never Used  Substance and Sexual Activity   Alcohol use: Not Currently   Drug use: Never   Sexual activity: Not on file  Other Topics Concern   Not on file  Social History Narrative   Diet:      Caffeine: Yes      Married, if yes what year: Yes, 1977      Do you live in a house, apartment, assisted living, condo, trailer, ect: House      Is it one or more stories: 3      How many persons live in your home? 2      Pets: No      Highest level or education completed: 15      Current/Past profession: Art gallery manager, Teacher, English as a foreign language      Exercise: Yes                 Type and how often: weights- normally 5 days/week         Living Will: No   DNR: No   POA/HPOA: No      Functional Status:   Do you have difficulty bathing or dressing yourself? No   Do you have difficulty preparing food or eating? No   Do you have difficulty managing your medications? No   Do you have difficulty managing your finances? No   Do you have difficulty affording your medications? No   Right handed    Social Drivers of Health   Financial Resource Strain: Low Risk  (10/03/2022)   Overall Financial Resource Strain (CARDIA)    Difficulty of Paying Living Expenses: Not hard at all  Food Insecurity: No Food Insecurity (01/31/2023)    Hunger Vital Sign    Worried About Running Out of Food in the Last Year: Never true    Ran Out of Food in the Last Year: Never true  Transportation Needs: No Transportation Needs (01/31/2023)   PRAPARE - Administrator, Civil Service (Medical): No    Lack of Transportation (Non-Medical): No  Physical Activity: Unknown (10/03/2022)   Exercise Vital Sign    Days of Exercise per Week: 0 days    Minutes of Exercise per Session: Not on file  Stress: Patient Declined (10/03/2022)  [employer] of Occupational Health - Occupational Stress Questionnaire    Feeling of Stress : Patient declined  Social Connections: Unknown (10/03/2022)   Social Connection and Isolation Panel    Frequency of Communication with Friends and Family: Patient declined    Frequency of Social Gatherings with Friends and Family: Patient declined    Attends Religious Services: Patient declined    Database administrator or Organizations: No    Attends Engineer, structural: Not on file    Marital Status: Married  Catering manager Violence: Not At Risk (01/31/2023)   Humiliation, Afraid, Rape, and Kick questionnaire    Fear of Current or Ex-Partner: No    Emotionally Abused: No    Physically Abused: No    Sexually Abused: No    FAMILY HISTORY: Family History  Adopted: Yes    ALLERGIES:  is allergic to morphine .  MEDICATIONS:  Current Outpatient Medications  Medication Sig Dispense Refill   acetaminophen  (TYLENOL ) 500 MG tablet Take 500 mg by mouth as needed for moderate pain.     aspirin  EC 81 MG tablet Take 1 tablet (81 mg total) by mouth 2 (two) times daily. Swallow whole. 30 tablet 12   calcium -vitamin D (OSCAL WITH D) 500-5 MG-MCG tablet Take 2 tablets by mouth 2 (two) times daily. 120 tablet 0   carbidopa -levodopa  (SINEMET  IR) 25-100 MG tablet TAKE 1 1/2 TABLETS BY MOUTH 3 TIMES DAILY. 7AM/11AM/4PM 405 tablet 0   donepezil  (ARICEPT ) 5 MG tablet Take 1 tablet (5 mg total) by mouth at  bedtime. 90 tablet 1   No current facility-administered medications for this visit.    REVIEW OF SYSTEMS:   Constitutional: ( - ) fevers, ( - )  chills , ( - ) night sweats Eyes: ( - ) blurriness of vision, ( - ) double vision, ( - ) watery eyes Ears, nose, mouth, throat, and face: ( - ) mucositis, ( - ) sore throat Respiratory: ( - ) cough, ( - ) dyspnea, ( - ) wheezes Cardiovascular: ( - ) palpitation, ( - ) chest discomfort, ( - ) lower extremity swelling Gastrointestinal:  ( - ) nausea, ( - ) heartburn, ( - ) change in bowel habits Skin: ( - ) abnormal skin rashes Lymphatics: ( - ) new lymphadenopathy, ( - ) easy bruising Neurological: ( - ) numbness, ( - ) tingling, ( - ) new weaknesses Behavioral/Psych: ( - ) mood change, ( - ) new changes  All other systems were reviewed with the patient and are negative.  PHYSICAL EXAMINATION: ECOG PERFORMANCE STATUS: 0 - Asymptomatic  There were no vitals filed for this visit.  There were no vitals filed for this visit.   GENERAL: Well-appearing elderly Caucasian male, alert, no distress and comfortable SKIN: skin color, texture, turgor are normal, no rashes or significant lesions EYES: conjunctiva are pink and non-injected, sclera clear NECK: supple, non-tender LYMPH:  no palpable lymphadenopathy in the cervical, axillary or inguinal LUNGS: clear to auscultation and percussion with normal breathing effort HEART: regular rate & rhythm and no murmurs and no lower extremity edema Musculoskeletal: no cyanosis of digits and no clubbing  PSYCH: alert & oriented x 3, fluent speech NEURO: no focal motor/sensory deficits  LABORATORY DATA:  I have reviewed the data as listed    Latest Ref Rng & Units 05/24/2023   11:01 AM 02/01/2023    3:29 AM 01/31/2023    4:38 AM  CBC  WBC 4.0 - 10.5 K/uL 38.1  49.5  42.5   Hemoglobin 13.0 - 17.0 g/dL 81.1  91.4  78.2   Hematocrit 39.0 - 52.0 % 37.9  37.6  36.6   Platelets 150 - 400 K/uL 102  100  125         Latest Ref Rng & Units 05/24/2023   11:01 AM 02/01/2023    3:29 AM 01/31/2023    4:38 AM  CMP  Glucose 70 - 99 mg/dL 956  213  086   BUN 8 - 23 mg/dL 25  17  21    Creatinine 0.61 - 1.24 mg/dL 5.78  4.69  6.29   Sodium 135 - 145 mmol/L 142  137  136   Potassium 3.5 - 5.1 mmol/L 4.4  4.3  4.1   Chloride 98 - 111 mmol/L 106  104  104   CO2 22 - 32 mmol/L 31  26  25    Calcium  8.9 - 10.3 mg/dL 9.4  8.6  8.7   Total Protein 6.5 - 8.1 g/dL 6.4     Total Bilirubin <1.2 mg/dL 0.9     Alkaline Phos 38 - 126 U/L 76     AST 15 - 41 U/L 14     ALT 0 - 44 U/L 14       RADIOGRAPHIC STUDIES: No results found.  ASSESSMENT & PLAN Burman A Nistler 73 y.o. male with medical history significant for newly diagnosed CLL who presents for a follow up visit.   At this time the patient's findings are most consistent with a Rai stage I CLL.  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    # Chronic Lymphocytic Leukemia, Rai Stage I --patient is Stage I based on lymphocytosis and lymphadenopathy --prognostic panel shows normal cytogenetics, intermediate risk. 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  -- No indication to treat based on today's labs.  Leukocytosis is increasing however patient has only mild cytopenias..  -- Labs today show white blood cell count 65.6, hemoglobin 12.6, MCV 95.5, platelets 115 --We discussed the results of his blood work today and he voices understanding.  He was willing to have his labs checked more frequently as his white count is increasing, but is aware his other counts are stable.  He is not having any other B symptoms or findings concerning for progression of disease. --recommend continued observation with a return clinic visit in 6 months time with interval 9-month labs.  No orders of the defined types were placed in this encounter.   All  questions were answered. The patient knows to call the clinic with any problems, questions or concerns.  I have spent a total of 30 minutes minutes of face-to-face and non-face-to-face time, preparing to see the patient, performing a medically appropriate examination, counseling and educating the patient, ordering tests, documenting clinical information in the electronic health record, and care coordination.   Rogerio Clay, MD Department of Hematology/Oncology [facility] Phone: [phone] Pager: [phone] Email: [email or web address]  11/22/2023 7:45 AM

## 2023-12-04 ENCOUNTER — Institutional Professional Consult (permissible substitution) (INDEPENDENT_AMBULATORY_CARE_PROVIDER_SITE_OTHER): Admitting: Otolaryngology

## 2023-12-21 ENCOUNTER — Other Ambulatory Visit: Payer: Self-pay | Admitting: Neurology

## 2023-12-21 DIAGNOSIS — F02B3 Dementia in other diseases classified elsewhere, moderate, with mood disturbance: Secondary | ICD-10-CM

## 2024-01-24 ENCOUNTER — Other Ambulatory Visit: Payer: Self-pay | Admitting: Neurology

## 2024-01-24 DIAGNOSIS — G20B1 Parkinson's disease with dyskinesia, without mention of fluctuations: Secondary | ICD-10-CM

## 2024-01-28 ENCOUNTER — Encounter: Payer: Self-pay | Admitting: Orthopedic Surgery

## 2024-01-28 NOTE — Telephone Encounter (Signed)
 Called Frederick Pham to schedule an appointment and she stated that someone has already called and scheduled.

## 2024-02-07 ENCOUNTER — Ambulatory Visit (INDEPENDENT_AMBULATORY_CARE_PROVIDER_SITE_OTHER): Admitting: Orthopedic Surgery

## 2024-02-07 ENCOUNTER — Encounter: Payer: Self-pay | Admitting: Orthopedic Surgery

## 2024-02-07 VITALS — BP 116/70 | HR 72 | Temp 97.5°F | Resp 16 | Ht 67.0 in | Wt 130.6 lb

## 2024-02-07 DIAGNOSIS — Z0000 Encounter for general adult medical examination without abnormal findings: Secondary | ICD-10-CM | POA: Diagnosis not present

## 2024-02-07 NOTE — Progress Notes (Signed)
 Subjective:   Frederick Pham is a 73 y.o. male who presents for Medicare Annual/Subsequent preventive examination.  Visit Complete: In person  Patient Medicare AWV questionnaire was completed by the patient on 02/07/2024; I have confirmed that all information answered by patient is correct and no changes since this date.  Cardiac Risk Factors include: advanced age (>20men, >51 women);male gender     Objective:    Today's Vitals   02/07/24 1251  BP: 116/70  Pulse: 72  Resp: 16  Temp: (!) 97.5 F (36.4 C)  SpO2: 98%  Weight: 130 lb 9.6 oz (59.2 kg)  Height: 5' 7 (1.702 m)   Body mass index is 20.45 kg/m.     02/07/2024   12:49 PM 05/31/2023   10:43 AM 04/23/2023   12:51 PM 02/15/2023    3:23 PM 01/31/2023    8:00 AM 12/07/2022   10:32 AM 10/30/2022    9:06 AM  Advanced Directives  Does Patient Have a Medical Advance Directive? Yes Yes No No No Yes No  Type of Estate agent of [place];Living will;Out of facility DNR (pink MOST or yellow form) Healthcare Power of [place];Living will    Living will   Does patient want to make changes to medical advance directive? No - Patient declined No - Patient declined    No - Patient declined   Copy of Healthcare Power of Attorney in Chart? No - copy requested No - copy requested       Would patient like information on creating a medical advance directive?    No - Patient declined No - Patient declined      Current Medications (verified) Outpatient Encounter Medications as of 02/07/2024  Medication Sig   acetaminophen  (TYLENOL ) 500 MG tablet Take 500 mg by mouth as needed for moderate pain.   aspirin  EC 81 MG tablet Take 81 mg by mouth daily. Swallow whole.   Calcium  Carb-Cholecalciferol (CALCIUM  600 + D PO) Take 1 tablet by mouth daily.   carbidopa -levodopa  (SINEMET  IR) 25-100 MG tablet TAKE 1 1/2 TABLETS BY MOUTH 3 TIMES DAILY. 7AM/11AM/4PM   donepezil  (ARICEPT ) 5 MG tablet TAKE 1 TABLET(5 MG) BY MOUTH AT  BEDTIME   aspirin  EC 81 MG tablet Take 1 tablet (81 mg total) by mouth 2 (two) times daily. Swallow whole. (Patient not taking: Reported on 02/07/2024)   calcium -vitamin D (OSCAL WITH D) 500-5 MG-MCG tablet Take 2 tablets by mouth 2 (two) times daily. (Patient not taking: Reported on 02/07/2024)   No facility-administered encounter medications on file as of 02/07/2024.    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 Dr. reports it all gone.     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   Thrombocytopenia 02/08/2021   Tremor of left hand 01/14/2021   Past Surgical History:  Procedure Laterality Date   FRACTURE SURGERY     age 36   TONSILLECTOMY     age 47   TOTAL HIP ARTHROPLASTY Left 09/14/2021   Procedure: TOTAL HIP ARTHROPLASTY ANTERIOR APPROACH;  Surgeon: Melodi Lerner, MD;  Location: [facility];  Service: Orthopedics;  Laterality: Left;   TOTAL HIP ARTHROPLASTY Right 01/31/2023   Procedure: TOTAL HIP ARTHROPLASTY ANTERIOR APPROACH;  Surgeon: Addie Cordella Hamilton, MD;  Location: [facility];  Service: Orthopedics;  Laterality: Right;   TOTAL KNEE ARTHROPLASTY Right  09/05/2021   Procedure: TOTAL KNEE ARTHROPLASTY;  Surgeon: Melodi Lerner, MD;  Location: [facility];  Service: Orthopedics;  Laterality: Right;   WISDOM TOOTH EXTRACTION     Family History  Adopted: Yes   Social History   Socioeconomic History   Marital status: Married    Spouse name: Not on file   Number of children: Not on file   Years of education: 14   Highest education level: Some college, no degree  Occupational History   Occupation: Medical illustrator  Tobacco Use   Smoking status: Former    Current packs/day: 0.00    Average packs/day: 1 pack/day for 10.0 years (10.0 ttl pk-yrs)    Types: Cigarettes    Start date: 10/07/1980    Quit date: 10/08/1990    Years since quitting: 33.3   Smokeless tobacco: Never  Vaping Use   Vaping status: Never Used  Substance and Sexual Activity   Alcohol use: Not Currently   Drug use: Never   Sexual activity: Not on file  Other Topics Concern   Not on file  Social History Narrative   Diet:      Caffeine: Yes      Married, if yes what year: Yes, 1977      Do you live in a house, apartment, assisted living, condo, trailer, ect: House      Is it one or more stories: 3      How many persons live in your home? 2      Pets: No      Highest level or education completed: 15      Current/Past profession: Art gallery manager, Teacher, English as a foreign language      Exercise: Yes                 Type and how often: weights- normally 5 days/week         Living Will: No   DNR: No   POA/HPOA: No      Functional Status:   Do you have difficulty bathing or dressing yourself? No   Do you have difficulty preparing food or eating? No   Do you have difficulty managing your medications? No   Do you have difficulty managing your finances? No   Do you have difficulty affording your medications? No   Right handed    Social Drivers of Health   Financial Resource Strain: Low Risk  (02/07/2024)   Overall Financial Resource Strain (CARDIA)    Difficulty of Paying Living Expenses: Not  hard at all  Food Insecurity: No Food Insecurity (02/07/2024)   Hunger Vital Sign    Worried About Running Out of Food in the Last Year: Never true    Ran Out of Food in the Last Year: Never true  Transportation Needs: No Transportation Needs (02/07/2024)   PRAPARE - Transportation  Lack of Transportation (Medical): No    Lack of Transportation (Non-Medical): No  Physical Activity: Insufficiently Active (02/07/2024)   Exercise Vital Sign    Days of Exercise per Week: 4 days    Minutes of Exercise per Session: 30 min  Stress: No Stress Concern Present (02/07/2024)   [employer] of Occupational Health - Occupational Stress Questionnaire    Feeling of Stress: Not at all  Social Connections: Moderately Isolated (02/07/2024)   Social Connection and Isolation Panel    Frequency of Communication with Friends and Family: Twice a week    Frequency of Social Gatherings with Friends and Family: Twice a week    Attends Religious Services: Never    Database administrator or Organizations: No    Attends Engineer, structural: Never    Marital Status: Married    Tobacco Counseling Counseling given: Not Answered   Clinical Intake:  Pre-visit preparation completed: Yes  Pain : No/denies pain     BMI - recorded: 20.45 Nutritional Status: BMI of 19-24  Normal Nutritional Risks: Unintentional weight loss Diabetes: No  How often do you need to have someone help you when you read instructions, pamphlets, or other written materials from your doctor or pharmacy?: 3 - Sometimes What is the last grade level you completed in school?: College  Interpreter Needed?: No      Activities of Daily Living    02/07/2024    1:12 PM  In your present state of health, do you have any difficulty performing the following activities:  Hearing? 0  Vision? 0  Difficulty concentrating or making decisions? 1  Walking or climbing stairs? 1  Dressing or bathing? 0  Doing errands, shopping? 1   Preparing Food and eating ? Y  Using the Toilet? Y  In the past six months, have you accidently leaked urine? Y  Do you have problems with loss of bowel control? Y  Managing your Medications? Y  Managing your Finances? Y  Housekeeping or managing your Housekeeping? Y    Patient Care Team: Gil Greig BRAVO, NP as PCP - General (Adult Health Nurse Practitioner) Silva Juliene SAUNDERS, DPM as Consulting Physician (Podiatry) Melodi Lerner, MD as Consulting Physician (Orthopedic Surgery) Tat, Asberry RAMAN, DO as Consulting Physician (Neurology)  Indicate any recent Medical Services you may have received from other than [facility] providers in the past year (date may be approximate).     Assessment:   This is a routine wellness examination for Zaylin.  Hearing/Vision screen Hearing Screening - Comments:: Some hearing concerns. Patient wears hearing aids. Vision Screening - Comments:: No vision concerns. Patient wears prescription glasses.Patient last eye exam August 2024.   Goals Addressed             This Visit's Progress    Maintain Mobility and Function   On track    Evidence-based guidance:  Emphasize the importance of physical activity and aerobic exercise as included in treatment plan; assess barriers to adherence; consider patient's abilities and preferences.  Encourage gradual increase in activity or exercise instead of stopping if pain occurs.  Reinforce individual therapy exercise prescription, such as strengthening, stabilization and stretching programs.  Promote optimal body mechanics to stabilize the spine with lifting and functional activity.  Encourage activity and mobility modifications to facilitate optimal function, such as using a log roll for bed mobility or dressing from a seated position.  Reinforce individual adaptive equipment recommendations to limit excessive spinal movements, such as a Event organiser.  Assess  adequacy of sleep; encourage use of sleep hygiene  techniques, such as bedtime routine; use of white noise; dark, cool bedroom; avoiding daytime naps, heavy meals or exercise before bedtime.  Promote positions and modification to optimize sleep and sexual activity; consider pillows or positioning devices to assist in maintaining neutral spine.  Explore options for applying ergonomic principles at work and home, such as frequent position changes, using ergonomically designed equipment and working at optimal height.  Promote modifications to increase comfort with driving such as lumbar support, optimizing seat and steering wheel position, using cruise control and taking frequent rest stops to stretch and walk.   Notes:        Depression Screen    02/07/2024   12:40 PM 05/31/2023    4:17 PM 12/07/2022   10:32 AM  PHQ 2/9 Scores  PHQ - 2 Score 0 0 0    Fall Risk    02/07/2024    1:16 PM 02/07/2024   12:40 PM 05/31/2023   10:43 AM 04/23/2023   12:51 PM 02/15/2023    3:23 PM  Fall Risk   Falls in the past year? 1 1 0 1 1  Comment    stumble   Number falls in past yr: 0 0 0 0 1  Injury with Fall? 0 0 0 0 1  Risk for fall due to : History of fall(s);Impaired balance/gait History of fall(s);Impaired balance/gait No Fall Risks  Impaired balance/gait;Impaired mobility  Follow up Falls evaluation completed;Education provided Falls evaluation completed Falls evaluation completed;Education provided;Falls prevention discussed Falls evaluation completed Falls evaluation completed;Education provided;Falls prevention discussed    MEDICARE RISK AT HOME: Medicare Risk at Home Any stairs in or around the home?: Yes If so, are there any without handrails?: Yes Home free of loose throw rugs in walkways, pet beds, electrical cords, etc?: Yes Adequate lighting in your home to reduce risk of falls?: Yes Life alert?: No Use of a cane, walker or w/c?: Yes Grab bars in the bathroom?: Yes Shower chair or bench in shower?: Yes Elevated toilet seat or a  handicapped toilet?: Yes  TIMED UP AND GO:  Was the test performed?  Yes  Length of time to ambulate 10 feet: > 5 sec Gait slow and steady without use of assistive device    Cognitive Function:    03/17/2021   10:15 AM  MMSE - Mini Mental State Exam  Orientation to time 5  Orientation to Place 5  Registration 3  Attention/ Calculation 5  Recall 3  Language- name 2 objects 2  Language- repeat 1  Language- follow 3 step command 3  Language- read & follow direction 1  Write a sentence 1  Copy design 1  Total score 30        02/07/2024   12:45 PM  6CIT Screen  What Year? 0 points  What month? 0 points  What time? 0 points  Count back from 20 0 points  Months in reverse 4 points  Repeat phrase 2 points  Total Score 6 points    Immunizations Immunization History  Administered Date(s) Administered   Fluad Quad(high Dose 65+) 03/10/2021, 06/23/2022, 04/27/2023   INFLUENZA, HIGH DOSE SEASONAL PF 04/27/2023   Influenza,inj,quad, With Preservative 04/30/2020   Influenza-Unspecified 04/30/2020   PFIZER Comirnaty(Gray Top)Covid-19 Tri-Sucrose Vaccine 03/10/2021   PFIZER(Purple Top)SARS-COV-2 Vaccination 07/02/2019, 07/24/2019, 03/10/2020, 09/20/2020   PNEUMOCOCCAL CONJUGATE-20 09/21/2021, 04/27/2023   Respiratory Syncytial Virus Vaccine,Recomb Aduvanted(Arexvy) 04/27/2023   Tdap 01/30/2023   Zoster Recombinant(Shingrix) 01/29/2019, 03/31/2019  TDAP status: Up to date  Flu Vaccine status: Due, Education has been provided regarding the importance of this vaccine. Advised may receive this vaccine at local pharmacy or Health Dept. Aware to provide a copy of the vaccination record if obtained from local pharmacy or Health Dept. Verbalized acceptance and understanding.  Pneumococcal vaccine status: Up to date  Covid-19 vaccine status: Declined, Education has been provided regarding the importance of this vaccine but patient still declined. Advised may receive this vaccine at  local pharmacy or Health Dept.or vaccine clinic. Aware to provide a copy of the vaccination record if obtained from local pharmacy or Health Dept. Verbalized acceptance and understanding.  Qualifies for Shingles Vaccine? Yes   Zostavax completed Yes   Shingrix Completed?: Yes  Screening Tests Health Maintenance  Topic Date Due   Hepatitis C Screening  Never done   Colonoscopy  Never done   COVID-19 Vaccine (6 - 2024-25 season) 02/11/2023   INFLUENZA VACCINE  01/11/2024   Medicare Annual Wellness (AWV)  02/06/2025   DTaP/Tdap/Td (2 - Td or Tdap) 01/29/2033   Pneumococcal Vaccine: 50+ Years  Completed   Zoster Vaccines- Shingrix  Completed   HPV VACCINES  Aged Out   Meningococcal B Vaccine  Aged Out    Health Maintenance  Health Maintenance Due  Topic Date Due   Hepatitis C Screening  Never done   Colonoscopy  Never done   COVID-19 Vaccine (6 - 2024-25 season) 02/11/2023   INFLUENZA VACCINE  01/11/2024    Colorectal cancer screening: Type of screening: Colonoscopy. Completed refused . Repeat every refused years  Lung Cancer Screening: (Low Dose CT Chest recommended if Age 22-80 years, 20 pack-year currently smoking OR have quit w/in 15years.) does not qualify.   Lung Cancer Screening Referral: No  Additional Screening:  Hepatitis C Screening: does not qualify; Completed   Vision Screening: Recommended annual ophthalmology exams for early detection of glaucoma and other disorders of the eye. Is the patient up to date with their annual eye exam?  Yes  Who is the provider or what is the name of the office in which the patient attends annual eye exams? [facility] If pt is not established with a provider, would they like to be referred to a provider to establish care? No .   Dental Screening: Recommended annual dental exams for proper oral hygiene  Diabetic Foot Exam: Diabetic Foot Exam: Completed 02/07/2024  Community Resource Referral / Chronic Care Management: CRR required  this visit?  No   CCM required this visit?  No     Plan:     I have personally reviewed and noted the following in the patient's chart:   Medical and social history Use of alcohol, tobacco or illicit drugs  Current medications and supplements including opioid prescriptions. Patient is not currently taking opioid prescriptions. Functional ability and status Nutritional status Physical activity Advanced directives List of other physicians Hospitalizations, surgeries, and ER visits in previous 12 months Vitals Screenings to include cognitive, depression, and falls Referrals and appointments  In addition, I have reviewed and discussed with patient certain preventive protocols, quality metrics, and best practice recommendations. A written personalized care plan for preventive services as well as general preventive health recommendations were provided to patient.     Greig FORBES Cluster, NP   02/07/2024   After Visit Summary: (MyChart) Due to this being a telephonic visit, the after visit summary with patients personalized plan was offered to patient via MyChart   Nurse Notes: 6CIT  score 6. Refusing colonoscopy. Does not want future covid vaccinations. Last fall 10/2023, no injury. Wife main support. He plans to get annual flu vaccine this fall.

## 2024-02-07 NOTE — Patient Instructions (Addendum)
  Mr. Alberta , Thank you for taking time to come for your Medicare Wellness Visit. I appreciate your ongoing commitment to your health goals. Please review the following plan we discussed and let me know if I can assist you in the future.   These are the goals we discussed:  Goals      Maintain Mobility and Function     Evidence-based guidance:  Emphasize the importance of physical activity and aerobic exercise as included in treatment plan; assess barriers to adherence; consider patient's abilities and preferences.  Encourage gradual increase in activity or exercise instead of stopping if pain occurs.  Reinforce individual therapy exercise prescription, such as strengthening, stabilization and stretching programs.  Promote optimal body mechanics to stabilize the spine with lifting and functional activity.  Encourage activity and mobility modifications to facilitate optimal function, such as using a log roll for bed mobility or dressing from a seated position.  Reinforce individual adaptive equipment recommendations to limit excessive spinal movements, such as a Event organiser.  Assess adequacy of sleep; encourage use of sleep hygiene techniques, such as bedtime routine; use of white noise; dark, cool bedroom; avoiding daytime naps, heavy meals or exercise before bedtime.  Promote positions and modification to optimize sleep and sexual activity; consider pillows or positioning devices to assist in maintaining neutral spine.  Explore options for applying ergonomic principles at work and home, such as frequent position changes, using ergonomically designed equipment and working at optimal height.  Promote modifications to increase comfort with driving such as lumbar support, optimizing seat and steering wheel position, using cruise control and taking frequent rest stops to stretch and walk.   Notes:         This is a list of the screening recommended for you and due dates:  Health  Maintenance  Topic Date Due   Hepatitis C Screening  Never done   Colon Cancer Screening  Never done   COVID-19 Vaccine (6 - 2024-25 season) 02/11/2023   Flu Shot  01/11/2024   Medicare Annual Wellness Visit  02/06/2025   DTaP/Tdap/Td vaccine (2 - Td or Tdap) 01/29/2033   Pneumococcal Vaccine for age over 42  Completed   Zoster (Shingles) Vaccine  Completed   HPV Vaccine  Aged Out   Meningitis B Vaccine  Aged Out   Consider stopping Aricept  due to diarrhea> discuss with Dr. Evonnie. Consider Namenda in replacement> discuss with Dr. Evonnie. Consider taking psyllium capsules twice weekly. Please eat more foods you enjoy> ideal wight would be 145 lbs.

## 2024-02-21 ENCOUNTER — Ambulatory Visit: Admitting: Neurology

## 2024-02-26 ENCOUNTER — Other Ambulatory Visit: Payer: Self-pay | Admitting: Hematology and Oncology

## 2024-02-26 DIAGNOSIS — C9110 Chronic lymphocytic leukemia of B-cell type not having achieved remission: Secondary | ICD-10-CM

## 2024-02-27 ENCOUNTER — Inpatient Hospital Stay: Attending: Hematology and Oncology

## 2024-02-27 ENCOUNTER — Telehealth: Payer: Self-pay | Admitting: *Deleted

## 2024-02-27 DIAGNOSIS — C9110 Chronic lymphocytic leukemia of B-cell type not having achieved remission: Secondary | ICD-10-CM | POA: Insufficient documentation

## 2024-02-27 LAB — CMP (CANCER CENTER ONLY)
ALT: <5 U/L (ref 0–44)
AST: 10 U/L — ABNORMAL LOW (ref 15–41)
Albumin: 4.5 g/dL (ref 3.5–5.0)
Alkaline Phosphatase: 75 U/L (ref 38–126)
Anion gap: 7 (ref 5–15)
BUN: 31 mg/dL — ABNORMAL HIGH (ref 8–23)
CO2: 29 mmol/L (ref 22–32)
Calcium: 9.4 mg/dL (ref 8.9–10.3)
Chloride: 106 mmol/L (ref 98–111)
Creatinine: 1.28 mg/dL — ABNORMAL HIGH (ref 0.61–1.24)
GFR, Estimated: 59 mL/min — ABNORMAL LOW (ref >60)
Glucose, Bld: 107 mg/dL — ABNORMAL HIGH (ref 70–99)
Potassium: 4.4 mmol/L (ref 3.5–5.1)
Sodium: 142 mmol/L (ref 135–145)
Total Bilirubin: 1.2 mg/dL (ref 0.0–1.2)
Total Protein: 6.6 g/dL (ref 6.5–8.1)

## 2024-02-27 LAB — CBC WITH DIFFERENTIAL (CANCER CENTER ONLY)
Abs Immature Granulocytes: 0.02 K/uL (ref 0.00–0.07)
Basophils Absolute: 0 K/uL (ref 0.0–0.1)
Basophils Relative: 0 %
Eosinophils Absolute: 0.1 K/uL (ref 0.0–0.5)
Eosinophils Relative: 0 %
HCT: 38.3 % — ABNORMAL LOW (ref 39.0–52.0)
Hemoglobin: 12.5 g/dL — ABNORMAL LOW (ref 13.0–17.0)
Immature Granulocytes: 0 %
Lymphocytes Relative: 98 %
Lymphs Abs: 63.8 K/uL — ABNORMAL HIGH (ref 0.7–4.0)
MCH: 31.8 pg (ref 26.0–34.0)
MCHC: 32.6 g/dL (ref 30.0–36.0)
MCV: 97.5 fL (ref 80.0–100.0)
Monocytes Absolute: 0.2 K/uL (ref 0.1–1.0)
Monocytes Relative: 0 %
Neutro Abs: 1.5 K/uL — ABNORMAL LOW (ref 1.7–7.7)
Neutrophils Relative %: 2 %
Platelet Count: 104 K/uL — ABNORMAL LOW (ref 150–400)
RBC: 3.93 MIL/uL — ABNORMAL LOW (ref 4.22–5.81)
RDW: 14.9 % (ref 11.5–15.5)
Smear Review: NORMAL
WBC Count: 65.5 K/uL (ref 4.0–10.5)
nRBC: 0 % (ref 0.0–0.2)

## 2024-02-27 LAB — LACTATE DEHYDROGENASE: LDH: 94 U/L — ABNORMAL LOW (ref 98–192)

## 2024-02-27 NOTE — Telephone Encounter (Signed)
 CRITICAL VALUE STICKER  CRITICAL VALUE: WBC 65.5  RECEIVER (on-site recipient of call): Landry Arabia, RN  DATE & TIME NOTIFIED: 11:10 am 02/27/24  MESSENGER (representative from lab):Eleanor  MD NOTIFIED: Johnston Police, PA-C  TIME OF NOTIFICATION: 1:00 pm  RESPONSE:  No change in current plans

## 2024-02-28 ENCOUNTER — Ambulatory Visit: Admitting: Neurology

## 2024-02-29 ENCOUNTER — Telehealth: Payer: Self-pay | Admitting: *Deleted

## 2024-02-29 NOTE — Telephone Encounter (Signed)
 TCT patient regarding recent lab results. Spoke with him. Advised  that Please notify patient that WBC is stable from 3 months ago and plts are overall stable from the last several months. He voiced understanding. He is aware of his next appt in December 2025

## 2024-03-04 NOTE — Progress Notes (Unsigned)
 Assessment/Plan:   1.  Parkinsons Disease with PDD             -Memory change at the time of diagnosis is no longer an exclusion for diagnosis of idiopathic Parkinson's disease.  I suspect that this is the correct diagnosis.               -continue carbidopa /levodopa  25/100, 1.5 tablets at 7 AM/11 AM/4 PM.  Doing well      2.  Urinary retention             -seeing urology   3.  CLL             -follows with Dr. Federico   4.  Moderate dementia             -Neurocognitive testing in March, 2025 with evidence of moderate dementia.  Dr. Richie is recommended fully abstaining from all driving.  He recommended retiring completely.               -improved on donepezil , 5 mg.  Hallucinations improved/resolved.  If recur in the future, can increase dosage and/or consider nuplazid   5.  Hip fracture, August 2024             -Following with orthopedics   6.  Watering eyes             -likely due to dry eye from Parkinsons Disease    7.  Day/night reversal             -discussed proper sleep schedule and they state that this is improving             - Discussed following up with primary care to help us  more with this.   Subjective:   Frederick Pham was seen today in follow up for Parkinsons disease.  My previous records were reviewed prior to todays visit as well as outside records available to me.  Pt with wife who supplements hx.    patient is now on donepezil .  This was started last visit.  He is tolerating it well, without side effects.  We have discussed 24 hour/day caregiving and they report that they have this in place. He is doing well on carbidopa /levodopa .  He cannot tell when it wears off.  In regards to hallucinations, his wife reports that he is not having any and wife agrees.  She states that they are on a great plateau.  He denies falls, but he did hit his arm on the glass table.    Current prescribed movement disorder medications: carbidopa /levodopa  25/100, 1.5 tablets at 7  AM/11 AM/4 PM  Donepezil  5 mg daily (started last visit)  ALLERGIES:   Allergies  Allergen Reactions   Morphine  Other (See Comments)     Caused extreme confusion and combativeness      CURRENT MEDICATIONS:  Current Meds  Medication Sig   acetaminophen  (TYLENOL ) 500 MG tablet Take 500 mg by mouth as needed for moderate pain.   calcium -vitamin D (OSCAL WITH D) 500-5 MG-MCG tablet Take 2 tablets by mouth 2 (two) times daily.   carbidopa -levodopa  (SINEMET  IR) 25-100 MG tablet TAKE 1 1/2 TABLETS BY MOUTH 3 TIMES DAILY. 7AM/11AM/4PM   [DISCONTINUED] donepezil  (ARICEPT ) 5 MG tablet TAKE 1 TABLET(5 MG) BY MOUTH AT BEDTIME     Objective:   PHYSICAL EXAMINATION:    VITALS:   Vitals:   03/05/24 1407  BP: 124/76  Pulse: 71  SpO2: 97%  Weight: 123 lb 12.8 oz (56.2 kg)  Height: 5' 7 (1.702 m)   Wt Readings from Last 3 Encounters:  03/05/24 123 lb 12.8 oz (56.2 kg)  02/07/24 130 lb 9.6 oz (59.2 kg)  11/22/23 131 lb 8 oz (59.6 kg)     GEN:  The patient appears stated age and is in NAD. HEENT:  Normocephalic, atraumatic.  The mucous membranes are moist. The superficial temporal arteries are without ropiness or tenderness. CV:  RRR Lungs:  CTAB Neck/HEME:  There are no carotid bruits bilaterally.  Neurological examination:  Orientation: The patient is alert and oriented to person/place.  Looks to wife for finer aspects hx Cranial nerves: There is good facial symmetry with mild facial hypomimia. The speech is fluent and clear. Soft palate rises symmetrically and there is no tongue deviation. Hearing is intact to conversational tone. Sensation: Sensation is intact to light touch throughout Motor: Strength is at least antigravity x4.  Movement examination: Tone: There is nl tone in the UE/LE Abnormal movements: there is LUE rest tremor, overall mild and rare.  There is mild RLE dyskinesia Coordination:  There is min decremation with RAM's, with hand opening and closing and finger  taps on the L  Gait and Station: The patient pushes off to arise.  He has excess arm swing on the R and very little on the R  I have reviewed and interpreted the following labs independently    Chemistry      Component Value Date/Time   NA 142 02/27/2024 1111   K 4.4 02/27/2024 1111   CL 106 02/27/2024 1111   CO2 29 02/27/2024 1111   BUN 31 (H) 02/27/2024 1111   CREATININE 1.28 (H) 02/27/2024 1111      Component Value Date/Time   CALCIUM  9.4 02/27/2024 1111   ALKPHOS 75 02/27/2024 1111   AST 10 (L) 02/27/2024 1111   ALT <5 02/27/2024 1111   BILITOT 1.2 02/27/2024 1111       Lab Results  Component Value Date   WBC 65.5 (HH) 02/27/2024   HGB 12.5 (L) 02/27/2024   HCT 38.3 (L) 02/27/2024   MCV 97.5 02/27/2024   PLT 104 (L) 02/27/2024    No results found for: TSH   Cc:  Gil Greig BRAVO, NP

## 2024-03-05 ENCOUNTER — Ambulatory Visit (INDEPENDENT_AMBULATORY_CARE_PROVIDER_SITE_OTHER): Admitting: Neurology

## 2024-03-05 ENCOUNTER — Encounter: Payer: Self-pay | Admitting: Neurology

## 2024-03-05 VITALS — BP 124/76 | HR 71 | Ht 67.0 in | Wt 123.8 lb

## 2024-03-05 DIAGNOSIS — G20B1 Parkinson's disease with dyskinesia, without mention of fluctuations: Secondary | ICD-10-CM | POA: Diagnosis not present

## 2024-03-05 DIAGNOSIS — G20A1 Parkinson's disease without dyskinesia, without mention of fluctuations: Secondary | ICD-10-CM | POA: Diagnosis not present

## 2024-03-05 DIAGNOSIS — R32 Unspecified urinary incontinence: Secondary | ICD-10-CM | POA: Diagnosis not present

## 2024-03-05 DIAGNOSIS — F02B3 Dementia in other diseases classified elsewhere, moderate, with mood disturbance: Secondary | ICD-10-CM | POA: Diagnosis not present

## 2024-03-05 MED ORDER — DONEPEZIL HCL 5 MG PO TABS: 5.0000 mg | ORAL_TABLET | Freq: Every day | ORAL | 1 refills | Status: DC

## 2024-03-05 NOTE — Patient Instructions (Signed)
 Join us  on social media and stay up to date with what is going on in the [place] regarding Parkinson's Disease and Movement Disorders!

## 2024-03-06 ENCOUNTER — Ambulatory Visit: Admitting: Neurology

## 2024-04-17 ENCOUNTER — Encounter: Payer: Self-pay | Admitting: Orthopedic Surgery

## 2024-04-25 ENCOUNTER — Other Ambulatory Visit: Payer: Self-pay | Admitting: Neurology

## 2024-04-25 DIAGNOSIS — G20B1 Parkinson's disease with dyskinesia, without mention of fluctuations: Secondary | ICD-10-CM

## 2024-05-27 NOTE — Progress Notes (Unsigned)
 Frederick Pham   [phone]  PROGRESS NOTE  Patient Care Team: Gil Greig BRAVO, NP as PCP - General (Adult Health Nurse Practitioner) Silva Juliene SAUNDERS, DPM as Consulting Physician (Podiatry) Melodi Lerner, MD as Consulting Physician (Orthopedic Surgery) Tat, Asberry RAMAN, DO as Consulting Physician (Neurology)  Hematological/Oncological History # Chronic Lymphocytic Leukemia, Rai Stage I 1) 01/21/2021: WBC 39.8 (H), Hgb 14.2, Plt 131 (L)  2) 02/08/2021: Establish care with [facility] Police PA-C. WBC: 46.7, Hgb 14.1, Plt 113K. ALC 45,900. Flow cytometry confirms a monoclonal B cell population consistent with CLL.  3) 02/18/2021: CLL prognostic panel results show normal cytogenetics, intermediate risk. IgVH positive. CT C/A/P showed enlarged retroperitoneal, portacaval, bilateral iliac, and pelvic sidewall lymph nodes, no evidence of splenomegaly.  05/26/2021: WBC 48.8, Hgb 13.3, MCV 95, Plt 90 08/24/2021: WBC 60.8, Hgb 13.6, MCV 94.5, Plt 108 11/24/2021: WBC 41.8, Hgb 12.9, MCV 91.8, Plt 122 05/24/2022: WBC 59.0, Hgb 13.7, MCV 95.2, Plt 101  Interval History:  Frederick Pham 73 y.o. male with medical history significant for newly diagnosed CLL who presents for a follow up visit. The patient's last visit was on 11/22/2023. In the interim since the last visit he has had no major changes in his health.  On exam today Frederick Pham reports he has been well overall since our last visit and reports he will be staying local for Christmas.  He reports he is get back on his exercises.  Unfortunately he has stopped these because he was doing so well.  He reports his energy level today is about a 5 out of 10.  He reports his appetite is strong and he loves to eat.  He is not having any abdominal swelling or any bumps or lumps concerning for lymphadenopathy.  He takes about 1 pill of Tylenol  per day to help with pain.  He notes he had no issues with viruses such as  fevers, chills, sweats, nausea, vomiting or diarrhea.  He denies any runny nose, sore throat, cough.  His wife is concerned he might have a UTI as he has some cloudy urine with odor.  He is going to urology today for catheter exchange and they will likely have his urine test at that time.  He notes overall he has been doing well and has no questions concerns or complaints today.  Full 10 point ROS is otherwise negative.  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 Dr. reports it all gone.     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   Thrombocytopenia 02/08/2021   Tremor of left hand 01/14/2021    SURGICAL HISTORY: Past Surgical History:  Procedure Laterality  Date   FRACTURE SURGERY     age 73   TONSILLECTOMY     age 27   TOTAL HIP ARTHROPLASTY Left 09/14/2021   Procedure: TOTAL HIP ARTHROPLASTY ANTERIOR APPROACH;  Surgeon: Melodi Lerner, MD;  Location: [facility];  Service: Orthopedics;  Laterality: Left;   TOTAL HIP ARTHROPLASTY Right 01/31/2023   Procedure: TOTAL HIP ARTHROPLASTY ANTERIOR APPROACH;  Surgeon: Addie Cordella Hamilton, MD;  Location: [facility];  Service: Orthopedics;  Laterality:  Right;   TOTAL KNEE ARTHROPLASTY Right 09/05/2021   Procedure: TOTAL KNEE ARTHROPLASTY;  Surgeon: Melodi Lerner, MD;  Location: [facility];  Service: Orthopedics;  Laterality: Right;   WISDOM TOOTH EXTRACTION      SOCIAL HISTORY: Social History   Socioeconomic History   Marital status: Married    Spouse name: Not on file   Number of children: Not on file   Years of education: 14   Highest education level: Some college, no degree  Occupational History   Occupation: Medical Illustrator  Tobacco Use   Smoking status: Former    Current packs/day: 0.00    Average packs/day: 1 pack/day for 10.0 years (10.0 ttl pk-yrs)    Types: Cigarettes    Start date: 10/07/1980    Quit date: 10/08/1990    Years since quitting: 33.6   Smokeless tobacco: Never  Vaping Use   Vaping status: Never Used  Substance and Sexual Activity   Alcohol use: Not Currently   Drug use: Never   Sexual activity: Not on file  Other Topics Concern   Not on file  Social History Narrative   Diet:      Caffeine: Yes      Married, if yes what year: Yes, 1977      Do you live in a house, apartment, assisted living, condo, trailer, ect: House      Is it one or more stories: 3      How many persons live in your home? 2      Pets: No      Highest level or education completed: 15      Current/Past profession: Art Gallery Manager, TEACHER, ENGLISH AS A FOREIGN LANGUAGE      Exercise: Yes                 Type and how often: weights- normally 5 days/week         Living Will: No   DNR: No   POA/HPOA: No      Functional Status:   Do you have difficulty bathing or dressing yourself? No   Do you have difficulty preparing food or eating? No   Do you have difficulty managing your medications? No   Do you have difficulty managing your finances? No   Do you have difficulty affording your medications? No   Right handed    Social Drivers of Health   Tobacco Use: Medium Risk (03/05/2024)   Patient History    Smoking Tobacco Use: Former    Smokeless Tobacco Use:  Never    Passive Exposure: Not on file  Financial Resource Strain: Low Risk (02/07/2024)   Overall Financial Resource Strain (CARDIA)    Difficulty of Paying Living Expenses: Not hard at all  Food Insecurity: No Food Insecurity (02/07/2024)   Epic    Worried About Radiation Protection Practitioner of Food in the Last Year: Never true    Ran Out of Food in the Last Year: Never true  Transportation Needs: No Transportation Needs (02/07/2024)   Epic    Lack of Transportation (Medical):  No    Lack of Transportation (Non-Medical): No  Physical Activity: Insufficiently Active (02/07/2024)   Exercise Vital Sign    Days of Exercise per Week: 4 days    Minutes of Exercise per Session: 30 min  Stress: No Stress Concern Present (02/07/2024)   [employer] of Occupational Health - Occupational Stress Questionnaire    Feeling of Stress: Not at all  Social Connections: Moderately Isolated (02/07/2024)   Social Connection and Isolation Panel    Frequency of Communication with Friends and Family: Twice a week    Frequency of Social Gatherings with Friends and Family: Twice a week    Attends Religious Services: Never    Database Administrator or Organizations: No    Attends Banker Meetings: Never    Marital Status: Married  Catering Manager Violence: Not At Risk (02/07/2024)   Epic    Fear of Current or Ex-Partner: No    Emotionally Abused: No    Physically Abused: No    Sexually Abused: No  Depression (PHQ2-9): Low Risk (02/07/2024)   Depression (PHQ2-9)    PHQ-2 Score: 0  Alcohol Screen: Low Risk (02/07/2024)   Alcohol Screen    Last Alcohol Screening Score (AUDIT): 0  Housing: Low Risk (02/07/2024)   Epic    Unable to Pay for Housing in the Last Year: No    Number of Times Moved in the Last Year: 0    Homeless in the Last Year: No  Utilities: Not At Risk (02/07/2024)   Epic    Threatened with loss of utilities: No  Health Literacy: Inadequate Health Literacy (02/07/2024)   B1300 Health Literacy     Frequency of need for help with medical instructions: Sometimes    FAMILY HISTORY: Family History  Adopted: Yes    ALLERGIES:  is allergic to morphine .  MEDICATIONS:  Current Outpatient Medications  Medication Sig Dispense Refill   acetaminophen  (TYLENOL ) 500 MG tablet Take 500 mg by mouth as needed for moderate pain.     aspirin  EC 81 MG tablet Take 1 tablet (81 mg total) by mouth 2 (two) times daily. Swallow whole. (Patient not taking: Reported on 03/05/2024) 30 tablet 12   calcium -vitamin D (OSCAL WITH D) 500-5 MG-MCG tablet Take 2 tablets by mouth 2 (two) times daily. 120 tablet 0   carbidopa -levodopa  (SINEMET  IR) 25-100 MG tablet TAKE 1 AND 1/2 TABLET BY MOUTH THREE TIMES DAILY(AT 7AM, 11AM AND 4PM) 405 tablet 0   donepezil  (ARICEPT ) 5 MG tablet Take 1 tablet (5 mg total) by mouth daily. 90 tablet 1   No current facility-administered medications for this visit.    REVIEW OF SYSTEMS:   Constitutional: ( - ) fevers, ( - )  chills , ( - ) night sweats Eyes: ( - ) blurriness of vision, ( - ) double vision, ( - ) watery eyes Ears, nose, mouth, throat, and face: ( - ) mucositis, ( - ) sore throat Respiratory: ( - ) cough, ( - ) dyspnea, ( - ) wheezes Cardiovascular: ( - ) palpitation, ( - ) chest discomfort, ( - ) lower extremity swelling Gastrointestinal:  ( - ) nausea, ( - ) heartburn, ( - ) change in bowel habits Skin: ( - ) abnormal skin rashes Lymphatics: ( - ) new lymphadenopathy, ( - ) easy bruising Neurological: ( - ) numbness, ( - ) tingling, ( - ) new weaknesses Behavioral/Psych: ( - ) mood change, ( - ) new changes  All other systems  were reviewed with the patient and are negative.  PHYSICAL EXAMINATION: ECOG PERFORMANCE STATUS: 0 - Asymptomatic  Vitals:   05/28/24 1144  BP: 129/70  Pulse: 80  Resp: 16  Temp: 98.2 F (36.8 C)  SpO2: 100%    Filed Weights   05/28/24 1144  Weight: 129 lb 12.8 oz (58.9 kg)     GENERAL: Well-appearing elderly Caucasian  male, alert, no distress and comfortable SKIN: skin color, texture, turgor are normal, no rashes or significant lesions EYES: conjunctiva are pink and non-injected, sclera clear NECK: supple, non-tender LYMPH:  no palpable lymphadenopathy in the cervical, axillary or inguinal LUNGS: clear to auscultation and percussion with normal breathing effort HEART: regular rate & rhythm and no murmurs and no lower extremity edema Musculoskeletal: no cyanosis of digits and no clubbing  PSYCH: alert & oriented x 3, fluent speech NEURO: no focal motor/sensory deficits  LABORATORY DATA:  I have reviewed the data as listed    Latest Ref Rng & Units 05/28/2024   11:23 AM 02/27/2024   11:11 AM 11/22/2023   11:09 AM  CBC  WBC 4.0 - 10.5 K/uL 64.1  65.5  65.6   Hemoglobin 13.0 - 17.0 g/dL 88.6  87.4  87.3   Hematocrit 39.0 - 52.0 % 35.7  38.3  38.0   Platelets 150 - 400 K/uL 143  104  115        Latest Ref Rng & Units 05/28/2024   11:23 AM 02/27/2024   11:11 AM 11/22/2023   11:09 AM  CMP  Glucose 70 - 99 mg/dL 894  892  883   BUN 8 - 23 mg/dL 20  31  25    Creatinine 0.61 - 1.24 mg/dL 8.91  8.71  8.71   Sodium 135 - 145 mmol/L 142  142  142   Potassium 3.5 - 5.1 mmol/L 4.5  4.4  4.3   Chloride 98 - 111 mmol/L 106  106  105   CO2 22 - 32 mmol/L 26  29  25    Calcium  8.9 - 10.3 mg/dL 9.3  9.4  9.5   Total Protein 6.5 - 8.1 g/dL 6.4  6.6  6.2   Total Bilirubin 0.0 - 1.2 mg/dL 0.6  1.2  1.4   Alkaline Phos 38 - 126 U/L 83  75  77   AST 15 - 41 U/L 17  10  16    ALT 0 - 44 U/L <5  <5  5     RADIOGRAPHIC STUDIES: No results found.  ASSESSMENT & PLAN Shamell A Dieckman 73 y.o. male with medical history significant for newly diagnosed CLL who presents for a follow up visit.   At this time the patient's findings are most consistent with a Rai stage I CLL.  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     # Chronic Lymphocytic Leukemia, Rai Stage I --patient is Stage I based on lymphocytosis and lymphadenopathy --prognostic panel shows normal cytogenetics, intermediate risk. 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  -- No indication to treat based on today's labs.  Leukocytosis is increasing however patient has only mild cytopenias..  -- Labs today show white blood cell count 64.1, hemoglobin 1.3, MCV 98.3, platelets 143 --We discussed the results of his blood work today and he voices understanding.  He was willing to have his labs checked more frequently as his white count is increasing,  but is aware his other counts are stable.  He is not having any other B symptoms or findings concerning for progression of disease. --recommend continued observation with a return clinic visit in 6 months time with interval 66-month labs.  No orders of the defined types were placed in this encounter.   All questions were answered. The patient knows to call the clinic with any problems, questions or concerns.  I have spent a total of 30 minutes minutes of face-to-face and non-face-to-face time, preparing to see the patient, performing a medically appropriate examination, counseling and educating the patient, ordering tests, documenting clinical information in the electronic health record, and care coordination.   Norleen IVAR Kidney, MD Department of Hematology/Oncology [facility] Phone: [phone] Pager: [phone] Email: [email or web address]  05/28/2024 2:00 PM

## 2024-05-28 ENCOUNTER — Inpatient Hospital Stay: Attending: Hematology and Oncology

## 2024-05-28 ENCOUNTER — Ambulatory Visit: Admitting: Hematology and Oncology

## 2024-05-28 VITALS — BP 129/70 | HR 80 | Temp 98.2°F | Resp 16 | Wt 129.8 lb

## 2024-05-28 DIAGNOSIS — Z87891 Personal history of nicotine dependence: Secondary | ICD-10-CM | POA: Insufficient documentation

## 2024-05-28 DIAGNOSIS — C9110 Chronic lymphocytic leukemia of B-cell type not having achieved remission: Secondary | ICD-10-CM | POA: Insufficient documentation

## 2024-05-28 DIAGNOSIS — D72820 Lymphocytosis (symptomatic): Secondary | ICD-10-CM

## 2024-05-28 DIAGNOSIS — D696 Thrombocytopenia, unspecified: Secondary | ICD-10-CM

## 2024-05-28 LAB — CBC WITH DIFFERENTIAL (CANCER CENTER ONLY)
Abs Immature Granulocytes: 0.05 K/uL (ref 0.00–0.07)
Basophils Absolute: 0 K/uL (ref 0.0–0.1)
Basophils Relative: 0 %
Eosinophils Absolute: 0 K/uL (ref 0.0–0.5)
Eosinophils Relative: 0 %
HCT: 35.7 % — ABNORMAL LOW (ref 39.0–52.0)
Hemoglobin: 11.3 g/dL — ABNORMAL LOW (ref 13.0–17.0)
Immature Granulocytes: 0 %
Lymphocytes Relative: 97 %
Lymphs Abs: 62.3 K/uL — ABNORMAL HIGH (ref 0.7–4.0)
MCH: 31.1 pg (ref 26.0–34.0)
MCHC: 31.7 g/dL (ref 30.0–36.0)
MCV: 98.3 fL (ref 80.0–100.0)
Monocytes Absolute: 0.2 K/uL (ref 0.1–1.0)
Monocytes Relative: 0 %
Neutro Abs: 1.5 K/uL — ABNORMAL LOW (ref 1.7–7.7)
Neutrophils Relative %: 3 %
Platelet Count: 143 K/uL — ABNORMAL LOW (ref 150–400)
RBC: 3.63 MIL/uL — ABNORMAL LOW (ref 4.22–5.81)
RDW: 14.4 % (ref 11.5–15.5)
Smear Review: NORMAL
WBC Count: 64.1 K/uL (ref 4.0–10.5)
nRBC: 0 % (ref 0.0–0.2)

## 2024-05-28 LAB — CMP (CANCER CENTER ONLY)
ALT: <5 U/L (ref 0–44)
AST: 17 U/L (ref 15–41)
Albumin: 4.1 g/dL (ref 3.5–5.0)
Alkaline Phosphatase: 83 U/L (ref 38–126)
Anion gap: 10 (ref 5–15)
BUN: 20 mg/dL (ref 8–23)
CO2: 26 mmol/L (ref 22–32)
Calcium: 9.3 mg/dL (ref 8.9–10.3)
Chloride: 106 mmol/L (ref 98–111)
Creatinine: 1.08 mg/dL (ref 0.61–1.24)
GFR, Estimated: >60 mL/min (ref >60)
Glucose, Bld: 105 mg/dL — ABNORMAL HIGH (ref 70–99)
Potassium: 4.5 mmol/L (ref 3.5–5.1)
Sodium: 142 mmol/L (ref 135–145)
Total Bilirubin: 0.6 mg/dL (ref 0.0–1.2)
Total Protein: 6.4 g/dL — ABNORMAL LOW (ref 6.5–8.1)

## 2024-05-28 LAB — LACTATE DEHYDROGENASE: LDH: 139 U/L (ref 105–235)

## 2024-06-11 ENCOUNTER — Encounter: Payer: Self-pay | Admitting: Neurology

## 2024-06-11 ENCOUNTER — Other Ambulatory Visit: Payer: Self-pay | Admitting: Neurology

## 2024-06-11 ENCOUNTER — Other Ambulatory Visit: Payer: Self-pay

## 2024-06-11 ENCOUNTER — Encounter: Payer: Self-pay | Admitting: Orthopedic Surgery

## 2024-06-11 DIAGNOSIS — G20B1 Parkinson's disease with dyskinesia, without mention of fluctuations: Secondary | ICD-10-CM

## 2024-06-11 MED ORDER — CARBIDOPA-LEVODOPA 25-100 MG PO TABS: 2.0000 | ORAL_TABLET | Freq: Three times a day (TID) | ORAL | 1 refills | Status: DC

## 2024-06-13 ENCOUNTER — Other Ambulatory Visit: Payer: Self-pay

## 2024-06-13 DIAGNOSIS — G20B1 Parkinson's disease with dyskinesia, without mention of fluctuations: Secondary | ICD-10-CM

## 2024-06-13 MED ORDER — CARBIDOPA-LEVODOPA 25-100 MG PO TABS: 1.5000 | ORAL_TABLET | Freq: Three times a day (TID) | ORAL | 1 refills | Status: DC

## 2024-06-14 ENCOUNTER — Encounter: Payer: Self-pay | Admitting: Neurology

## 2024-06-17 DIAGNOSIS — F02B3 Dementia in other diseases classified elsewhere, moderate, with mood disturbance: Secondary | ICD-10-CM

## 2024-06-20 ENCOUNTER — Encounter: Payer: Self-pay | Admitting: Neurology

## 2024-06-25 NOTE — Progress Notes (Signed)
 "  Virtual Visit Via Video       Consent was obtained for video visit:  Yes.   Answered questions that patient had about telehealth interaction:  Yes.   I discussed the limitations, risks, security and privacy concerns of performing an evaluation and management service by telemedicine. I also discussed with the patient that there may be a patient responsible charge related to this service. The patient expressed understanding and agreed to proceed.  Pt location: Home Physician Location: office Name of referring provider:  No ref. provider found I connected with Frederick Pham at patients initiation/request on 06/27/2024 at  8:45 AM EST by video enabled telemedicine application and verified that I am speaking with the correct person using two identifiers. Pt MRN:  [id number] Pt DOB:  1950-07-14 Video Participants:  [name];  wife supplements hx  Assessment/Plan:   1.  Parkinsons Disease with PDD             -Memory change at the time of diagnosis is no longer an exclusion for diagnosis of idiopathic Parkinson's disease.  I suspect that this is the correct diagnosis.               -they note that they seem that he seemed to have more confusion/paranoia at night with carbidopa /levodopa  25/100, 2/2/2, compared to 1-1/2 tablets at 3 times per day, with last dosages being at 4 PM.  This would be somewhat unusual and I wonder if it is more the disease than the medicine itself.  I told them to go ahead and back down to 1.5 tablets 3 times per day again.  Ultimately, his wife asked if she could go ahead and try to at 7 AM, 2 at 11 AM and 1.5 at 4 PM and I had no objection to that.  - Refer to physical therapy      2.  Urinary retention             -seeing urology   3.  CLL             -follows with Dr. Federico   4.  Moderate dementia             -Neurocognitive testing in March, 2025 with evidence of moderate dementia.  Dr. Richie is recommended fully abstaining from all driving.  He  recommended retiring completely.               -improved on donepezil , 5 mg.  Hallucinations improved/resolved.  If recur in the future, can increase dosage and/or consider nuplazid   5.  Hip fracture, August 2024             -Following with orthopedics   6.  Watering eyes             -likely due to dry eye from Parkinsons Disease    7.  Day/night reversal             -discussed proper sleep schedule and they state that this is improving             - Discussed following up with primary care to help us  more with this.  8.  Discussed with patient and wife that it looks like home health may be a long-term thing for him and that they are going to be requiring frequent notes for physical therapy.  I only see the patient every 6 months, and because access is somewhat limited, he may need to utilize primary care  to help assist with physical therapy visits.  They are just switching primary care's currently, and glad to see that they are getting a physician close to home. Subjective:   Frederick Pham was seen today in follow up for Parkinsons disease.  My previous records were reviewed prior to todays visit as well as outside records available to me.  Pt with wife who supplements hx.    patient seen at end of September, but would like some additional physical therapy.  Home physical therapy felt that his notes were not updated enough and wanted new notes today.  Has had few falls with sliding down edges of bed, but does have difficulty ambulating around.  Has trouble getting in/out of bed and they didn't work on that last PT because didn't have that trouble last visit.    Saw oncology December 17 and CLL has been relatively stable, without treatment.  His white count has been increasing, so they are going to be checking labs more frequently.  They note that his carbidopa /levodopa  25/100 increased levodopa  to 2/2/2 (titrated) but they note now that he will awaken and be confused/delusional and be paranoid.   He was having stutter steps.  Current prescribed movement disorder medications: carbidopa /levodopa  25/100, 1.5 tablets at 7 AM/11 AM/4 PM  Donepezil  5 mg daily (started last visit)  ALLERGIES:   Allergies  Allergen Reactions   Morphine  Other (See Comments)     Caused extreme confusion and combativeness      CURRENT MEDICATIONS:  No outpatient medications have been marked as taking for the 06/27/24 encounter (Appointment) with Zakry Caso, Asberry RAMAN, DO.     Objective:   PHYSICAL EXAMINATION:    VITALS:   There were no vitals filed for this visit.  Wt Readings from Last 3 Encounters:  05/28/24 129 lb 12.8 oz (58.9 kg)  03/05/24 123 lb 12.8 oz (56.2 kg)  02/07/24 130 lb 9.6 oz (59.2 kg)     GEN:  The patient appears stated age and is in NAD. HEENT:  Normocephalic, atraumatic.  T  Neurological examination:  Orientation: The patient is alert and oriented to person/place.  Looks to wife for finer aspects hx Cranial nerves: There is good facial symmetry with mild facial hypomimia. The speech is fluent and clear.    I have reviewed and interpreted the following labs independently    Chemistry      Component Value Date/Time   NA 142 05/28/2024 1123   K 4.5 05/28/2024 1123   CL 106 05/28/2024 1123   CO2 26 05/28/2024 1123   BUN 20 05/28/2024 1123   CREATININE 1.08 05/28/2024 1123      Component Value Date/Time   CALCIUM  9.3 05/28/2024 1123   ALKPHOS 83 05/28/2024 1123   AST 17 05/28/2024 1123   ALT <5 05/28/2024 1123   BILITOT 0.6 05/28/2024 1123       Lab Results  Component Value Date   WBC 64.1 (HH) 05/28/2024   HGB 11.3 (L) 05/28/2024   HCT 35.7 (L) 05/28/2024   MCV 98.3 05/28/2024   PLT 143 (L) 05/28/2024    No results found for: TSH   Cc:  No primary care provider on file.  "

## 2024-06-26 ENCOUNTER — Telehealth: Payer: Self-pay

## 2024-06-26 NOTE — Telephone Encounter (Signed)
 Copied from CRM [id number]. Topic: Medical Record Request - Records Request >> Jun 26, 2024  4:09 PM Marda MATSU wrote: Reason for CRM: Attn: Callahan Wild Please call patient wife regarding an error on a medical records form that was sent on 06/11/24  (see mychart msg)

## 2024-06-26 NOTE — Telephone Encounter (Deleted)
 Please call parent

## 2024-06-26 NOTE — Telephone Encounter (Deleted)
 AttnBETHA Skill   Please call Pt. Frederick Pham wife back regarding Medical records documentation. She stated it needs correction.

## 2024-06-26 NOTE — Telephone Encounter (Signed)
 Spoke with patient and blank medical release form has been mailed out to the patient as requested.

## 2024-06-27 ENCOUNTER — Telehealth (INDEPENDENT_AMBULATORY_CARE_PROVIDER_SITE_OTHER): Payer: Self-pay | Admitting: Neurology

## 2024-06-27 DIAGNOSIS — G20B1 Parkinson's disease with dyskinesia, without mention of fluctuations: Secondary | ICD-10-CM

## 2024-06-27 DIAGNOSIS — F02B3 Dementia in other diseases classified elsewhere, moderate, with mood disturbance: Secondary | ICD-10-CM

## 2024-06-27 DIAGNOSIS — G20A1 Parkinson's disease without dyskinesia, without mention of fluctuations: Secondary | ICD-10-CM | POA: Diagnosis not present

## 2024-06-27 MED ORDER — CARBIDOPA-LEVODOPA 25-100 MG PO TABS: ORAL_TABLET | ORAL | Status: AC

## 2024-07-18 ENCOUNTER — Telehealth: Payer: Self-pay | Admitting: Neurology

## 2024-07-18 NOTE — Telephone Encounter (Signed)
 Called and they are resending paperwork to be signed

## 2024-07-18 NOTE — Telephone Encounter (Signed)
 [facility] Call ID: [id number]  Caller states she sent an order for the patient's plan of care but have not received it back.   PH: [phone]

## 2024-08-14 ENCOUNTER — Ambulatory Visit: Payer: Self-pay | Admitting: Orthopedic Surgery

## 2024-08-26 ENCOUNTER — Inpatient Hospital Stay

## 2024-09-11 ENCOUNTER — Ambulatory Visit: Admitting: Neurology

## 2024-11-26 ENCOUNTER — Inpatient Hospital Stay: Admitting: Hematology and Oncology

## 2024-11-26 ENCOUNTER — Inpatient Hospital Stay
